# Patient Record
Sex: Female | Born: 1935 | Race: White | Hispanic: No | Smoking: Never smoker
Health system: Southern US, Community
[De-identification: ages and names within clinical notes are randomized; demographics above are authoritative.]

## PROBLEM LIST (undated history)

## (undated) DIAGNOSIS — R001 Bradycardia, unspecified: Secondary | ICD-10-CM

## (undated) DIAGNOSIS — M353 Polymyalgia rheumatica: Secondary | ICD-10-CM

## (undated) DIAGNOSIS — K449 Diaphragmatic hernia without obstruction or gangrene: Secondary | ICD-10-CM

## (undated) DIAGNOSIS — E039 Hypothyroidism, unspecified: Secondary | ICD-10-CM

## (undated) DIAGNOSIS — M1990 Unspecified osteoarthritis, unspecified site: Secondary | ICD-10-CM

## (undated) DIAGNOSIS — H269 Unspecified cataract: Secondary | ICD-10-CM

## (undated) DIAGNOSIS — K5730 Diverticulosis of large intestine without perforation or abscess without bleeding: Secondary | ICD-10-CM

## (undated) DIAGNOSIS — E785 Hyperlipidemia, unspecified: Secondary | ICD-10-CM

## (undated) DIAGNOSIS — M25562 Pain in left knee: Secondary | ICD-10-CM

## (undated) DIAGNOSIS — M1712 Unilateral primary osteoarthritis, left knee: Secondary | ICD-10-CM

## (undated) DIAGNOSIS — M4800 Spinal stenosis, site unspecified: Secondary | ICD-10-CM

## (undated) DIAGNOSIS — R1310 Dysphagia, unspecified: Secondary | ICD-10-CM

## (undated) DIAGNOSIS — K529 Noninfective gastroenteritis and colitis, unspecified: Secondary | ICD-10-CM

## (undated) DIAGNOSIS — F1920 Other psychoactive substance dependence, uncomplicated: Secondary | ICD-10-CM

## (undated) DIAGNOSIS — M5126 Other intervertebral disc displacement, lumbar region: Secondary | ICD-10-CM

## (undated) DIAGNOSIS — M4120 Other idiopathic scoliosis, site unspecified: Secondary | ICD-10-CM

## (undated) DIAGNOSIS — M25462 Effusion, left knee: Secondary | ICD-10-CM

## (undated) DIAGNOSIS — R2681 Unsteadiness on feet: Secondary | ICD-10-CM

## (undated) DIAGNOSIS — M48061 Spinal stenosis, lumbar region without neurogenic claudication: Secondary | ICD-10-CM

## (undated) DIAGNOSIS — N189 Chronic kidney disease, unspecified: Secondary | ICD-10-CM

## (undated) DIAGNOSIS — M5135 Other intervertebral disc degeneration, thoracolumbar region: Secondary | ICD-10-CM

## (undated) DIAGNOSIS — I350 Nonrheumatic aortic (valve) stenosis: Secondary | ICD-10-CM

## (undated) DIAGNOSIS — I517 Cardiomegaly: Secondary | ICD-10-CM

## (undated) DIAGNOSIS — R5381 Other malaise: Secondary | ICD-10-CM

## (undated) DIAGNOSIS — S81819A Laceration without foreign body, unspecified lower leg, initial encounter: Secondary | ICD-10-CM

## (undated) DIAGNOSIS — K52831 Collagenous colitis: Secondary | ICD-10-CM

## (undated) DIAGNOSIS — S22089A Unspecified fracture of T11-T12 vertebra, initial encounter for closed fracture: Secondary | ICD-10-CM

## (undated) DIAGNOSIS — R609 Edema, unspecified: Secondary | ICD-10-CM

## (undated) DIAGNOSIS — F419 Anxiety disorder, unspecified: Secondary | ICD-10-CM

## (undated) DIAGNOSIS — K589 Irritable bowel syndrome without diarrhea: Secondary | ICD-10-CM

## (undated) DIAGNOSIS — I10 Essential (primary) hypertension: Secondary | ICD-10-CM

## (undated) DIAGNOSIS — E876 Hypokalemia: Secondary | ICD-10-CM

## (undated) DIAGNOSIS — I491 Atrial premature depolarization: Secondary | ICD-10-CM

## (undated) DIAGNOSIS — M810 Age-related osteoporosis without current pathological fracture: Secondary | ICD-10-CM

## (undated) DIAGNOSIS — M25561 Pain in right knee: Secondary | ICD-10-CM

## (undated) DIAGNOSIS — K5792 Diverticulitis of intestine, part unspecified, without perforation or abscess without bleeding: Secondary | ICD-10-CM

## (undated) HISTORY — DX: Unspecified fracture of t11-T12 vertebra, initial encounter for closed fracture: S22.089A

## (undated) HISTORY — DX: Unspecified cataract: H26.9

## (undated) HISTORY — PX: OTHER SURGICAL HISTORY: SHX169

## (undated) HISTORY — DX: Diaphragmatic hernia without obstruction or gangrene: K44.9

## (undated) HISTORY — DX: Pain in right knee: M25.561

## (undated) HISTORY — DX: Collagenous colitis: K52.831

## (undated) HISTORY — DX: Other psychoactive substance dependence, uncomplicated: F19.20

## (undated) HISTORY — DX: Anxiety disorder, unspecified: F41.9

## (undated) HISTORY — DX: Chronic kidney disease, unspecified: N18.9

## (undated) HISTORY — DX: Age-related osteoporosis without current pathological fracture: M81.0

## (undated) HISTORY — DX: Edema, unspecified: R60.9

## (undated) HISTORY — DX: Irritable bowel syndrome without diarrhea: K58.9

## (undated) HISTORY — DX: Effusion, left knee: M25.462

## (undated) HISTORY — DX: Noninfective gastroenteritis and colitis, unspecified: K52.9

## (undated) HISTORY — DX: Other intervertebral disc degeneration, thoracolumbar region: M51.35

## (undated) HISTORY — PX: EYE SURGERY: SHX253

## (undated) HISTORY — DX: Diverticulosis of large intestine without perforation or abscess without bleeding: K57.30

## (undated) HISTORY — DX: Unsteadiness on feet: R26.81

## (undated) HISTORY — DX: Spinal stenosis, lumbar region without neurogenic claudication: M48.061

## (undated) HISTORY — DX: Other malaise: R53.81

## (undated) HISTORY — PX: BACK SURGERY: SHX140

## (undated) HISTORY — DX: Laceration without foreign body, unspecified lower leg, initial encounter: S81.819A

## (undated) HISTORY — DX: Cardiomegaly: I51.7

## (undated) HISTORY — DX: Dysphagia, unspecified: R13.10

## (undated) HISTORY — DX: Other intervertebral disc displacement, lumbar region: M51.26

## (undated) HISTORY — DX: Polymyalgia rheumatica: M35.3

## (undated) HISTORY — PX: APPENDECTOMY: SHX54

## (undated) HISTORY — DX: Unilateral primary osteoarthritis, left knee: M17.12

## (undated) HISTORY — DX: Pain in left knee: M25.562

## (undated) HISTORY — DX: Other idiopathic scoliosis, site unspecified: M41.20

## (undated) HISTORY — PX: CHOLECYSTECTOMY: SHX55

## (undated) HISTORY — DX: Hyperlipidemia, unspecified: E78.5

---

## 1964-01-15 HISTORY — PX: ABDOMINAL HYSTERECTOMY: SHX81

## 1997-12-22 LAB — HM COLONOSCOPY

## 1998-11-29 ENCOUNTER — Encounter: Payer: Self-pay | Admitting: Specialist

## 1998-11-29 ENCOUNTER — Ambulatory Visit (HOSPITAL_COMMUNITY): Admission: RE | Admit: 1998-11-29 | Discharge: 1998-11-29 | Payer: Self-pay | Admitting: Specialist

## 1999-10-25 ENCOUNTER — Ambulatory Visit (HOSPITAL_COMMUNITY): Admission: RE | Admit: 1999-10-25 | Discharge: 1999-10-25 | Payer: Self-pay | Admitting: Gastroenterology

## 1999-10-25 ENCOUNTER — Encounter (INDEPENDENT_AMBULATORY_CARE_PROVIDER_SITE_OTHER): Payer: Self-pay | Admitting: Specialist

## 2001-07-22 ENCOUNTER — Encounter: Payer: Self-pay | Admitting: Emergency Medicine

## 2001-07-22 ENCOUNTER — Emergency Department (HOSPITAL_COMMUNITY): Admission: EM | Admit: 2001-07-22 | Discharge: 2001-07-22 | Payer: Self-pay | Admitting: Emergency Medicine

## 2005-07-10 ENCOUNTER — Emergency Department (HOSPITAL_COMMUNITY): Admission: EM | Admit: 2005-07-10 | Discharge: 2005-07-10 | Payer: Self-pay | Admitting: Emergency Medicine

## 2005-12-03 ENCOUNTER — Other Ambulatory Visit: Admission: RE | Admit: 2005-12-03 | Discharge: 2005-12-03 | Payer: Self-pay | Admitting: Obstetrics and Gynecology

## 2006-02-19 ENCOUNTER — Encounter: Admission: RE | Admit: 2006-02-19 | Discharge: 2006-02-19 | Payer: Self-pay | Admitting: Cardiology

## 2006-03-30 ENCOUNTER — Emergency Department (HOSPITAL_COMMUNITY): Admission: EM | Admit: 2006-03-30 | Discharge: 2006-03-30 | Payer: Self-pay | Admitting: Family Medicine

## 2006-05-20 ENCOUNTER — Encounter: Admission: RE | Admit: 2006-05-20 | Discharge: 2006-05-20 | Payer: Self-pay | Admitting: Family Medicine

## 2008-07-23 ENCOUNTER — Encounter: Admission: RE | Admit: 2008-07-23 | Discharge: 2008-07-23 | Payer: Self-pay | Admitting: Family Medicine

## 2008-08-09 ENCOUNTER — Encounter: Admission: RE | Admit: 2008-08-09 | Discharge: 2008-08-09 | Payer: Self-pay | Admitting: Neurosurgery

## 2009-01-06 ENCOUNTER — Emergency Department (HOSPITAL_BASED_OUTPATIENT_CLINIC_OR_DEPARTMENT_OTHER): Admission: EM | Admit: 2009-01-06 | Discharge: 2009-01-06 | Payer: Self-pay | Admitting: Emergency Medicine

## 2010-02-04 ENCOUNTER — Encounter: Payer: Self-pay | Admitting: Cardiology

## 2010-06-01 NOTE — Procedures (Signed)
[facility]  Patient:    Christina Cabrera, Christina Cabrera                     MRN: [id number] Proc. Date: 10/25/99 Adm. Date:  [id number] Attending:  Orland Mustard CC:         Anna Genre. Little, M.D.   Procedure Report  PROCEDURE:  Colonoscopy and biopsy.  MEDICATIONS:  Fentanyl 75 mcg, Versed 8 mg IV.  INDICATION:  A woman with a history of collagenous colitis with continued diarrhea.  COLONOSCOPE:  Pediatric video colonoscope.  DESCRIPTION OF PROCEDURE:  The procedure had been explained to the patient and consent obtained.  With the patient in the left lateral decubitus position, the pediatric video colonoscope was inserted following digital rectal exam and advanced under direct visualization.  The prep was quite good.  We were able to advance to the cecum with abdominal pressure and the ileocecal valve and appendiceal orifice seen.  Scope withdrawn, random biopsies taken from cecum and transverse colon, jar #1; from the descending and sigmoid colon, jar #2. No significant diverticular disease.  The mucosa did appear somewhat "washed out," but no obvious ulcerations or edema.  The rectum was free of polyps.  No polyps or diverticular disease were seen throughout the colon.  ASSESSMENT:  Possible collagenous colitis.  Will check pathology, and will continue patient on current medicines for now.  See back in the office in four to six weeks. DD:  10/25/99 TD:  10/27/99 Job: [id number]

## 2010-09-10 ENCOUNTER — Other Ambulatory Visit: Payer: Self-pay | Admitting: Neurosurgery

## 2010-09-10 DIAGNOSIS — M545 Low back pain: Secondary | ICD-10-CM

## 2010-09-12 ENCOUNTER — Ambulatory Visit
Admission: RE | Admit: 2010-09-12 | Discharge: 2010-09-12 | Disposition: A | Discharge status: Home / Self Care | Payer: Medicare Other | Source: Ambulatory Visit | Attending: Neurosurgery | Admitting: Neurosurgery

## 2010-09-12 DIAGNOSIS — M545 Low back pain: Secondary | ICD-10-CM

## 2011-03-06 DIAGNOSIS — R1310 Dysphagia, unspecified: Secondary | ICD-10-CM | POA: Diagnosis not present

## 2011-03-06 DIAGNOSIS — K921 Melena: Secondary | ICD-10-CM | POA: Diagnosis not present

## 2011-03-06 DIAGNOSIS — R300 Dysuria: Secondary | ICD-10-CM | POA: Diagnosis not present

## 2011-03-06 DIAGNOSIS — R799 Abnormal finding of blood chemistry, unspecified: Secondary | ICD-10-CM | POA: Diagnosis not present

## 2011-03-06 DIAGNOSIS — Z79899 Other long term (current) drug therapy: Secondary | ICD-10-CM | POA: Diagnosis not present

## 2011-03-06 DIAGNOSIS — E782 Mixed hyperlipidemia: Secondary | ICD-10-CM | POA: Diagnosis not present

## 2011-03-28 ENCOUNTER — Ambulatory Visit
Admission: RE | Admit: 2011-03-28 | Discharge: 2011-03-28 | Disposition: A | Discharge status: Home / Self Care | Payer: Medicare Other | Source: Ambulatory Visit | Attending: Nephrology | Admitting: Nephrology

## 2011-03-28 ENCOUNTER — Other Ambulatory Visit: Payer: Self-pay | Admitting: Nephrology

## 2011-03-28 DIAGNOSIS — N179 Acute kidney failure, unspecified: Secondary | ICD-10-CM

## 2011-03-28 DIAGNOSIS — N184 Chronic kidney disease, stage 4 (severe): Secondary | ICD-10-CM

## 2011-03-28 DIAGNOSIS — I129 Hypertensive chronic kidney disease with stage 1 through stage 4 chronic kidney disease, or unspecified chronic kidney disease: Secondary | ICD-10-CM | POA: Diagnosis not present

## 2011-03-28 DIAGNOSIS — R358 Other polyuria: Secondary | ICD-10-CM | POA: Diagnosis not present

## 2011-03-28 DIAGNOSIS — N189 Chronic kidney disease, unspecified: Secondary | ICD-10-CM | POA: Diagnosis not present

## 2011-04-29 DIAGNOSIS — R002 Palpitations: Secondary | ICD-10-CM | POA: Diagnosis not present

## 2011-04-29 DIAGNOSIS — R011 Cardiac murmur, unspecified: Secondary | ICD-10-CM | POA: Diagnosis not present

## 2011-04-29 DIAGNOSIS — E039 Hypothyroidism, unspecified: Secondary | ICD-10-CM | POA: Diagnosis not present

## 2011-05-03 DIAGNOSIS — R011 Cardiac murmur, unspecified: Secondary | ICD-10-CM | POA: Diagnosis not present

## 2011-05-03 DIAGNOSIS — R002 Palpitations: Secondary | ICD-10-CM | POA: Diagnosis not present

## 2011-05-13 DIAGNOSIS — R011 Cardiac murmur, unspecified: Secondary | ICD-10-CM | POA: Diagnosis not present

## 2011-05-13 DIAGNOSIS — R002 Palpitations: Secondary | ICD-10-CM | POA: Diagnosis not present

## 2011-06-03 DIAGNOSIS — R609 Edema, unspecified: Secondary | ICD-10-CM | POA: Diagnosis not present

## 2011-06-03 DIAGNOSIS — I129 Hypertensive chronic kidney disease with stage 1 through stage 4 chronic kidney disease, or unspecified chronic kidney disease: Secondary | ICD-10-CM | POA: Diagnosis not present

## 2011-06-03 DIAGNOSIS — N184 Chronic kidney disease, stage 4 (severe): Secondary | ICD-10-CM | POA: Diagnosis not present

## 2011-06-03 DIAGNOSIS — E039 Hypothyroidism, unspecified: Secondary | ICD-10-CM | POA: Diagnosis not present

## 2011-06-03 DIAGNOSIS — I10 Essential (primary) hypertension: Secondary | ICD-10-CM | POA: Diagnosis not present

## 2011-06-27 DIAGNOSIS — H04129 Dry eye syndrome of unspecified lacrimal gland: Secondary | ICD-10-CM | POA: Diagnosis not present

## 2011-06-27 DIAGNOSIS — H3530 Unspecified macular degeneration: Secondary | ICD-10-CM | POA: Diagnosis not present

## 2011-06-27 DIAGNOSIS — H35369 Drusen (degenerative) of macula, unspecified eye: Secondary | ICD-10-CM | POA: Diagnosis not present

## 2011-06-27 DIAGNOSIS — Z961 Presence of intraocular lens: Secondary | ICD-10-CM | POA: Diagnosis not present

## 2011-07-11 ENCOUNTER — Emergency Department (HOSPITAL_COMMUNITY): Payer: Medicare Other

## 2011-07-11 ENCOUNTER — Encounter (HOSPITAL_COMMUNITY): Payer: Self-pay | Admitting: Emergency Medicine

## 2011-07-11 ENCOUNTER — Inpatient Hospital Stay (HOSPITAL_COMMUNITY)
Admission: EM | Admit: 2011-07-11 | Discharge: 2011-07-13 | DRG: 394 | Disposition: A | Discharge status: Home / Self Care | Payer: Medicare Other | Attending: Internal Medicine | Admitting: Internal Medicine

## 2011-07-11 DIAGNOSIS — M353 Polymyalgia rheumatica: Secondary | ICD-10-CM

## 2011-07-11 DIAGNOSIS — R1084 Generalized abdominal pain: Secondary | ICD-10-CM | POA: Diagnosis present

## 2011-07-11 DIAGNOSIS — K559 Vascular disorder of intestine, unspecified: Secondary | ICD-10-CM | POA: Diagnosis not present

## 2011-07-11 DIAGNOSIS — K52831 Collagenous colitis: Secondary | ICD-10-CM

## 2011-07-11 DIAGNOSIS — G8929 Other chronic pain: Secondary | ICD-10-CM

## 2011-07-11 DIAGNOSIS — I120 Hypertensive chronic kidney disease with stage 5 chronic kidney disease or end stage renal disease: Secondary | ICD-10-CM | POA: Diagnosis not present

## 2011-07-11 DIAGNOSIS — E872 Acidosis, unspecified: Secondary | ICD-10-CM | POA: Diagnosis present

## 2011-07-11 DIAGNOSIS — R109 Unspecified abdominal pain: Secondary | ICD-10-CM | POA: Diagnosis present

## 2011-07-11 DIAGNOSIS — E039 Hypothyroidism, unspecified: Secondary | ICD-10-CM | POA: Diagnosis present

## 2011-07-11 DIAGNOSIS — K551 Chronic vascular disorders of intestine: Principal | ICD-10-CM | POA: Diagnosis present

## 2011-07-11 DIAGNOSIS — K449 Diaphragmatic hernia without obstruction or gangrene: Secondary | ICD-10-CM | POA: Diagnosis not present

## 2011-07-11 DIAGNOSIS — K5289 Other specified noninfective gastroenteritis and colitis: Secondary | ICD-10-CM | POA: Diagnosis present

## 2011-07-11 DIAGNOSIS — N184 Chronic kidney disease, stage 4 (severe): Secondary | ICD-10-CM | POA: Diagnosis present

## 2011-07-11 DIAGNOSIS — I129 Hypertensive chronic kidney disease with stage 1 through stage 4 chronic kidney disease, or unspecified chronic kidney disease: Secondary | ICD-10-CM | POA: Diagnosis present

## 2011-07-11 DIAGNOSIS — K5900 Constipation, unspecified: Secondary | ICD-10-CM | POA: Diagnosis not present

## 2011-07-11 DIAGNOSIS — E876 Hypokalemia: Secondary | ICD-10-CM | POA: Diagnosis not present

## 2011-07-11 DIAGNOSIS — R52 Pain, unspecified: Secondary | ICD-10-CM | POA: Diagnosis not present

## 2011-07-11 DIAGNOSIS — I10 Essential (primary) hypertension: Secondary | ICD-10-CM | POA: Diagnosis present

## 2011-07-11 HISTORY — DX: Irritable bowel syndrome, unspecified: K58.9

## 2011-07-11 HISTORY — DX: Essential (primary) hypertension: I10

## 2011-07-11 HISTORY — DX: Polymyalgia rheumatica: M35.3

## 2011-07-11 HISTORY — DX: Spinal stenosis, site unspecified: M48.00

## 2011-07-11 HISTORY — DX: Unspecified osteoarthritis, unspecified site: M19.90

## 2011-07-11 HISTORY — DX: Collagenous colitis: K52.831

## 2011-07-11 HISTORY — DX: Diverticulitis of intestine, part unspecified, without perforation or abscess without bleeding: K57.92

## 2011-07-11 LAB — CBC
HCT: 36.3 % (ref 36.0–46.0)
MCH: 28.6 pg (ref 26.0–34.0)
MCHC: 33.3 g/dL (ref 30.0–36.0)
MCV: 85.8 fL (ref 78.0–100.0)
RDW: 13.7 % (ref 11.5–15.5)

## 2011-07-11 LAB — URINALYSIS, ROUTINE W REFLEX MICROSCOPIC
Bilirubin Urine: NEGATIVE
Nitrite: NEGATIVE
Protein, ur: NEGATIVE mg/dL
Specific Gravity, Urine: 1.011 (ref 1.005–1.030)
Urobilinogen, UA: 0.2 mg/dL (ref 0.0–1.0)

## 2011-07-11 LAB — LIPASE, BLOOD: Lipase: 41 U/L (ref 11–59)

## 2011-07-11 LAB — LACTIC ACID, PLASMA: Lactic Acid, Venous: 3.4 mmol/L — ABNORMAL HIGH (ref 0.5–2.2)

## 2011-07-11 LAB — OCCULT BLOOD, POC DEVICE: Fecal Occult Bld: NEGATIVE

## 2011-07-11 LAB — COMPREHENSIVE METABOLIC PANEL
Albumin: 3.7 g/dL (ref 3.5–5.2)
BUN: 41 mg/dL — ABNORMAL HIGH (ref 6–23)
Creatinine, Ser: 1.84 mg/dL — ABNORMAL HIGH (ref 0.50–1.10)
Total Protein: 6.8 g/dL (ref 6.0–8.3)

## 2011-07-11 LAB — MAGNESIUM: Magnesium: 1.9 mg/dL (ref 1.5–2.5)

## 2011-07-11 MED ORDER — POTASSIUM CHLORIDE 10 MEQ/100ML IV SOLN
10.0000 meq | Freq: Once | INTRAVENOUS | Status: AC
  Administered 2011-07-11: 10 meq via INTRAVENOUS
  Filled 2011-07-11: qty 100

## 2011-07-11 MED ORDER — MORPHINE SULFATE 2 MG/ML IJ SOLN
1.0000 mg | Freq: Once | INTRAMUSCULAR | Status: DC
  Filled 2011-07-11: qty 1

## 2011-07-11 MED ORDER — POTASSIUM CHLORIDE 20 MEQ/15ML (10%) PO LIQD
40.0000 meq | Freq: Once | ORAL | Status: AC
  Administered 2011-07-11: 40 meq via ORAL
  Filled 2011-07-11: qty 15

## 2011-07-11 MED ORDER — POTASSIUM CHLORIDE 10 MEQ/100ML IV SOLN
10.0000 meq | INTRAVENOUS | Status: AC
  Administered 2011-07-11 – 2011-07-12 (×2): 10 meq via INTRAVENOUS
  Filled 2011-07-11 (×2): qty 100

## 2011-07-11 MED ORDER — ALPRAZOLAM 0.25 MG PO TABS
0.2500 mg | ORAL_TABLET | Freq: Once | ORAL | Status: AC
  Administered 2011-07-11: 0.25 mg via ORAL
  Filled 2011-07-11: qty 1

## 2011-07-11 MED ORDER — SODIUM CHLORIDE 0.9 % IV SOLN
Freq: Once | INTRAVENOUS | Status: AC
  Administered 2011-07-11: 75 mL/h via INTRAVENOUS

## 2011-07-11 MED ORDER — HYDROCODONE-ACETAMINOPHEN 5-325 MG PO TABS
1.0000 | ORAL_TABLET | Freq: Once | ORAL | Status: AC | PRN
  Administered 2011-07-11: 1 via ORAL
  Filled 2011-07-11: qty 1

## 2011-07-11 NOTE — ED Notes (Signed)
Pt bought in by [facility] @ [place] for c/o abd pain after lunch today hx of Colitis

## 2011-07-11 NOTE — ED Provider Notes (Signed)
History     CSN: [id number]  Arrival date & time 07/11/11  1423   First MD Initiated Contact with Patient 07/11/11 1522      Chief Complaint  Patient presents with  . Abdominal Pain    (Consider location/radiation/quality/duration/timing/severity/associated sxs/prior treatment) HPI Comments: 78 yo female with history of collagenous colitis and chronic constipation secondary to chronic back pain and vicodin, presents from ALF after an onset of acute abdominal pain while eating lunch. She states she normally has generalized abdominal soreness and early satiety related to her constipation, has to use a bulb syringe for manual disimpaction at times. This pain is improved now, lasted 2 hours, radiating across upper abdomen. Last BM was yesterday, small amount. Takes daily citrucel for constipation.   Denies any fevers, chills, dysuria, nausea, emesis, diarrhea, blood in stool.   Patient is a 78 y.o. female presenting with abdominal pain.  Abdominal Pain The primary symptoms of the illness include abdominal pain. The primary symptoms of the illness do not include fever, shortness of breath, nausea, vomiting, diarrhea or dysuria.  Additional symptoms associated with the illness include constipation and back pain. Symptoms associated with the illness do not include chills or hematuria.    Past Medical History  Diagnosis Date  . Hypertension     No past surgical history on file.  No family history on file.  History  Substance Use Topics  . Smoking status: Not on file  . Smokeless tobacco: Not on file  . Alcohol Use:     OB History    Grav Para Term Preterm Abortions TAB SAB Ect Mult Living                  Review of Systems  Constitutional: Negative for fever and chills.  Respiratory: Negative for cough, shortness of breath and wheezing.   Cardiovascular: Negative for chest pain.  Gastrointestinal: Positive for abdominal pain, constipation and abdominal distention. Negative  for nausea, vomiting, diarrhea, blood in stool, anal bleeding and rectal pain.  Genitourinary: Negative for dysuria, hematuria, difficulty urinating and pelvic pain.  Musculoskeletal: Positive for back pain.  Neurological: Negative for dizziness and syncope.  All other systems reviewed and are negative.    Allergies  Quinolones  Home Medications   Current Outpatient Rx  Name Route Sig Dispense Refill  . ALPRAZOLAM 0.5 MG PO TABS Oral Take 0.25 mg by mouth 2 (two) times daily.    Marland Kitchen AMLODIPINE BESYLATE 2.5 MG PO TABS Oral Take 2.5 mg by mouth daily.    . ATORVASTATIN CALCIUM 10 MG PO TABS Oral Take 10 mg by mouth daily.    Marland Kitchen CLONIDINE HCL 0.1 MG PO TABS Oral Take 0.05 mg by mouth at bedtime. Pt takes 1/2 tab for 0.05mg  dose    . FUROSEMIDE 40 MG PO TABS Oral Take 40 mg by mouth 2 (two) times daily.    Marland Kitchen HYDRALAZINE HCL 50 MG PO TABS Oral Take 50 mg by mouth 2 (two) times daily.    Marland Kitchen HYDROCODONE-ACETAMINOPHEN 10-325 MG PO TABS Oral Take 1 tablet by mouth every 8 (eight) hours as needed. pain    . ALLERGY EYE DROPS OP Ophthalmic Apply 1 drop to eye 2 (two) times daily.    Marland Kitchen LEVOTHYROXINE SODIUM 50 MCG PO TABS Oral Take 25 mcg by mouth daily. Pt takes 1/2 tab for dose    . METHYLCELLULOSE (LAXATIVE) PO POWD Oral Take 1 packet by mouth daily.    . NEBIVOLOL HCL 10 MG  PO TABS Oral Take 10 mg by mouth every morning.    Marland Kitchen OMEPRAZOLE 20 MG PO CPDR Oral Take 20 mg by mouth daily.    Marland Kitchen PREDNISONE 5 MG PO TABS Oral Take 5 mg by mouth every morning.    Marland Kitchen SERTRALINE HCL 25 MG PO TABS Oral Take 25 mg by mouth daily.      BP 154/62  Pulse 53  Temp 97.5 F (36.4 C) (Oral)  Resp 18  SpO2 98%  Physical Exam  Vitals reviewed. Constitutional: She is oriented to person, place, and time. She appears well-developed and well-nourished. No distress.  HENT:  Head: Normocephalic and atraumatic.  Eyes: EOM are normal. Pupils are equal, round, and reactive to light.  Cardiovascular: Normal rate,  regular rhythm and normal heart sounds.   No murmur heard. Pulmonary/Chest: Effort normal and breath sounds normal.  Abdominal: Soft. She exhibits no mass. There is tenderness. There is no rebound and no guarding.       Hyperactive BS. Mild distention. General TTP with deep palpation.  Musculoskeletal: She exhibits no edema and no tenderness.  Neurological: She is alert and oriented to person, place, and time. No cranial nerve deficit.  Skin: No rash noted. She is not diaphoretic.  Psychiatric: She has a normal mood and affect.    ED Course  Procedures (including critical care time)   Labs Reviewed  CBC  COMPREHENSIVE METABOLIC PANEL  LIPASE, BLOOD  LACTIC ACID, PLASMA  URINALYSIS, ROUTINE W REFLEX MICROSCOPIC   Results for orders placed during the hospital encounter of 07/11/11 (from the past 24 hour(s))  URINALYSIS, ROUTINE W REFLEX MICROSCOPIC     Status: Normal   Collection Time   07/11/11  4:09 PM      Component Value Range   Color, Urine YELLOW  YELLOW   APPearance CLEAR  CLEAR   Specific Gravity, Urine 1.011  1.005 - 1.030   pH 7.5  5.0 - 8.0   Glucose, UA NEGATIVE  NEGATIVE mg/dL   Hgb urine dipstick NEGATIVE  NEGATIVE   Bilirubin Urine NEGATIVE  NEGATIVE   Ketones, ur NEGATIVE  NEGATIVE mg/dL   Protein, ur NEGATIVE  NEGATIVE mg/dL   Urobilinogen, UA 0.2  0.0 - 1.0 mg/dL   Nitrite NEGATIVE  NEGATIVE   Leukocytes, UA NEGATIVE  NEGATIVE  CBC     Status: Normal   Collection Time   07/11/11  4:13 PM      Component Value Range   WBC 5.8  4.0 - 10.5 K/uL   RBC 4.23  3.87 - 5.11 MIL/uL   Hemoglobin 12.1  12.0 - 15.0 g/dL   HCT 16.1  09.6 - 04.5 %   MCV 85.8  78.0 - 100.0 fL   MCH 28.6  26.0 - 34.0 pg   MCHC 33.3  30.0 - 36.0 g/dL   RDW 40.9  81.1 - 91.4 %   Platelets 195  150 - 400 K/uL  COMPREHENSIVE METABOLIC PANEL     Status: Abnormal   Collection Time   07/11/11  4:13 PM      Component Value Range   Sodium 140  135 - 145 mEq/L   Potassium 2.6 (*) 3.5 - 5.1  mEq/L   Chloride 99  96 - 112 mEq/L   CO2 25  19 - 32 mEq/L   Glucose, Bld 157 (*) 70 - 99 mg/dL   BUN 41 (*) 6 - 23 mg/dL   Creatinine, Ser 7.82 (*) 0.50 - 1.10 mg/dL  Calcium 9.4  8.4 - 10.5 mg/dL   Total Protein 6.8  6.0 - 8.3 g/dL   Albumin 3.7  3.5 - 5.2 g/dL   AST 29  0 - 37 U/L   ALT 15  0 - 35 U/L   Alkaline Phosphatase 54  39 - 117 U/L   Total Bilirubin 0.4  0.3 - 1.2 mg/dL   GFR calc non Af Amer 23 (*) >90 mL/min   GFR calc Af Amer 26 (*) >90 mL/min  LIPASE, BLOOD     Status: Normal   Collection Time   07/11/11  4:13 PM      Component Value Range   Lipase 41  11 - 59 U/L  LACTIC ACID, PLASMA     Status: Abnormal   Collection Time   07/11/11  4:13 PM      Component Value Range   Lactic Acid, Venous 3.4 (*) 0.5 - 2.2 mmol/L    Ct Abdomen Pelvis Wo Contrast  07/11/2011  *RADIOLOGY REPORT*  Clinical Data: Abdominal pain. Elevated lactate  CT ABDOMEN AND PELVIS WITHOUT CONTRAST  Technique:  Multidetector CT imaging of the abdomen and pelvis was performed following the standard protocol without intravenous contrast.  Comparison: None.  Findings: There is mild pleural fluid at the left lung base.  No pericardial fluid.  There is a large hiatal hernia.  Small bowel is normal.  Colon and rectosigmoid colon are normal.  Non-IV contrast images demonstrate no focal hepatic lesion.  Prior cholecystectomy.  The pancreas, spleen, and adrenal glands, and kidneys are normal.  Abdominal aorta normal caliber.  No retroperitoneal periportal lymphadenopathy.  No free fluid the pelvis.  Prior hysterectomy.  No pelvic lymphadenopathy. Review of  bone windows demonstrates no aggressive osseous lesions. There is severe disc osteophytic disease of the spine.  IMPRESSION:  1.  Large hiatal hernia.  2.  Small left effusion. 3.  No evidence of bowel obstruction or free air. 4. Severe degenerative changes of the spine.  Original Report Authenticated By: Genevive Bi, M.D.   Dg Abd Acute  W/chest  07/11/2011  *RADIOLOGY REPORT*  Clinical Data: Constipation, abdominal  ACUTE ABDOMEN SERIES (ABDOMEN 2 VIEW & CHEST 1 VIEW)  Comparison: Pain  Findings: Normal cardiac silhouette.  The aorta is ectatic. Scoliosis noted.  Lungs hyperinflated.  No free air beneath hemidiaphragms.  No dilated loops of large or small bowel.  No pathologic calcifications.  Surgical clips the right upper quadrant.  IMPRESSION:  1.  No acute cardiopulmonary findings. 2.  No evidence of bowel obstruction or free air. 3.  No evidence of constipation.  Original Report Authenticated By: Genevive Bi, M.D.     1. Chronic generalized abdominal pain   2. Constipation   3. Lactic acidosis     MDM  78 yo female with chronic constipation and collagenous colitis presents with an episode of acute on chronic abdominal pain today, now with generalized soreness and elevated lactate. Will check CT abdomen to rule out ischemia.   8:23 PM Patient's pain is improved, she states she is having diffuse soreness. Tolerating orals. Low potassium likely secondary to lasix, treated with IV run and oral dose 40 mEq. CT results are negative for intra-abdominal pathology, but with elevated lactate, mild acidosis and pain onset with eating, still concern for gut ischemia. Plan to admit for hydration and observation. Case d/w [facility] physician on call.       Durwin Reges, MD 07/11/11 2159

## 2011-07-11 NOTE — ED Notes (Signed)
ZOX:[id number]<AV> Expected date:07/11/11<BR> Expected time:<BR> Means of arrival:<BR> Comments:<BR> M10- 89yof -  abd pain(ongoing)

## 2011-07-11 NOTE — H&P (Signed)
Hospital Admission Note Date: 07/11/2011  Patient name: Christina Cabrera Medical record number: [id number] Date of birth: 03/10/1917 Age: 78 y.o. Gender: female PCP: No primary provider on file.  Attending physician: Altha Harm, MD  Chief Complaint: Severe postprandial abdominal pain  History of Present Illness: This is a very pleasant 78 year old female with a history of collagenous colitis who has chronic abdominal pain and back pain. The patient states that after eating her meal today she had an acute onset of acute sharp abdominal pain extending from the left upper quadrant of the right upper quadrant. She states that the pain was at least 10/10 at the time of its occurrence and continued until she came to the emergency room. Several hours after arriving to the emergency room that  acute pain has resolved and she now is back to her chronic pain which she states is a chronic aching in that as a 3/10. The patient also states that she has chronic low back pain which she manages with pain medications.   The patient states that she has been having some postprandial pain for some time however it is not as intense as it was today. She states that the pain occupied her consciousness at the time to the extent that she's unable to identify any associated symptoms. She denies any specific palliative or provocative features. She denies any chest pain, dizziness, near syncope, loss of consciousness, palpitations, dysuria, frequency. She states that she suffers from constipation and has to use a bulb syringe to assist evacuation of her stool or regular basis.  In the emergency room the patient was noted to have an elevated lactic acid and to decrease potassium. We are asked to see the patient for further evaluation and management.  Please also note that I discussed with the patient in the presence of her daughter her advanced directive and the patient states that she does want to be a no CODE BLUE. The  patient also states that she does not want to undergo any invasive testing or therapy for her condition.  Scheduled Meds:   . sodium chloride   Intravenous Once  . ALPRAZolam  0.25 mg Oral Once  . potassium chloride  10 mEq Intravenous Once  . potassium chloride  40 mEq Oral Once  . DISCONTD:  morphine injection  1 mg Intravenous Once   Continuous Infusions:  PRN Meds:.HYDROcodone-acetaminophen Allergies: Quinolones Past Medical History  Diagnosis Date  . Hypertension   . Arthritis   . Diverticulitis   . Irritable bowel   . PMR (polymyalgia rheumatica)   . Spinal stenosis   . Thyroid disease    Past Surgical History  Procedure Date  . Back surgery    No family history on file. History   Social History  . Marital Status: Widowed    Spouse Name: N/A    Number of Children: N/A  . Years of Education: N/A   Occupational History  . Not on file.   Social History Main Topics  . Smoking status: Not on file  . Smokeless tobacco: Not on file  . Alcohol Use: No  . Drug Use: No  . Sexually Active: No   Other Topics Concern  . Not on file   Social History Narrative  . No narrative on file   Review of Systems: Review of systems are negative except as noted in the history of present illness Physical Exam: No intake or output data in the 24 hours ending 07/11/11 2321 General: Alert, awake,  oriented x3, in no acute distress. She appears younger than her stated age.  HEENT: Burns/AT PEERL, EOMI Neck: Trachea midline,  no masses, no thyromegal,y no JVD, no carotid bruit OROPHARYNX:  Moist, No exudate/ erythema/lesions.  Heart: Regular rate and rhythm, without murmurs, rubs, gallops.  Lungs: Clear to auscultation, no wheezing or rhonchi noted. No increased vocal fremitus resonant to percussion  Abdomen: Soft and with diffuse tenderness. It is nondistended and with positive bowel sounds, no masses no hepatosplenomegaly noted..  Neuro: No focal neurological deficits noted  cranial nerves II through XII grossly intact.  Musculoskeletal: No warm swelling or erythema around joints, no spinal tenderness noted. Psychiatric: Patient alert and oriented x3, good insight and cognition, good recent to remote recall.   Lab results:  [facility] 07/11/11 1613  NA 140  K 2.6*  CL 99  CO2 25  GLUCOSE 157*  BUN 41*  CREATININE 1.84*  CALCIUM 9.4  MG --  PHOS --    Basename 07/11/11 1613  AST 29  ALT 15  ALKPHOS 54  BILITOT 0.4  PROT 6.8  ALBUMIN 3.7    Basename 07/11/11 1613  LIPASE 41  AMYLASE --    Basename 07/11/11 1613  WBC 5.8  NEUTROABS --  HGB 12.1  HCT 36.3  MCV 85.8  PLT 195   No results found for this basename: CKTOTAL:3,CKMB:3,CKMBINDEX:3,TROPONINI:3 in the last 72 hours No components found with this basename: POCBNP:3 No results found for this basename: DDIMER:2 in the last 72 hours No results found for this basename: HGBA1C:2 in the last 72 hours No results found for this basename: CHOL:2,HDL:2,LDLCALC:2,TRIG:2,CHOLHDL:2,LDLDIRECT:2 in the last 72 hours No results found for this basename: TSH,T4TOTAL,FREET3,T3FREE,THYROIDAB in the last 72 hours No results found for this basename: VITAMINB12:2,FOLATE:2,FERRITIN:2,TIBC:2,IRON:2,RETICCTPCT:2 in the last 72 hours Imaging results:  Ct Abdomen Pelvis Wo Contrast  07/11/2011  *RADIOLOGY REPORT*  Clinical Data: Abdominal pain. Elevated lactate  CT ABDOMEN AND PELVIS WITHOUT CONTRAST  Technique:  Multidetector CT imaging of the abdomen and pelvis was performed following the standard protocol without intravenous contrast.  Comparison: None.  Findings: There is mild pleural fluid at the left lung base.  No pericardial fluid.  There is a large hiatal hernia.  Small bowel is normal.  Colon and rectosigmoid colon are normal.  Non-IV contrast images demonstrate no focal hepatic lesion.  Prior cholecystectomy.  The pancreas, spleen, and adrenal glands, and kidneys are normal.  Abdominal aorta normal  caliber.  No retroperitoneal periportal lymphadenopathy.  No free fluid the pelvis.  Prior hysterectomy.  No pelvic lymphadenopathy. Review of  bone windows demonstrates no aggressive osseous lesions. There is severe disc osteophytic disease of the spine.  IMPRESSION:  1.  Large hiatal hernia.  2.  Small left effusion. 3.  No evidence of bowel obstruction or free air. 4. Severe degenerative changes of the spine.  Original Report Authenticated By: Genevive Bi, M.D.   Dg Abd Acute W/chest  07/11/2011  *RADIOLOGY REPORT*  Clinical Data: Constipation, abdominal  ACUTE ABDOMEN SERIES (ABDOMEN 2 VIEW & CHEST 1 VIEW)  Comparison: Pain  Findings: Normal cardiac silhouette.  The aorta is ectatic. Scoliosis noted.  Lungs hyperinflated.  No free air beneath hemidiaphragms.  No dilated loops of large or small bowel.  No pathologic calcifications.  Surgical clips the right upper quadrant.  IMPRESSION:  1.  No acute cardiopulmonary findings. 2.  No evidence of bowel obstruction or free air. 3.  No evidence of constipation.  Original Report Authenticated By: Genevive Bi, M.D.  Other results:    Patient Active Hospital Problem List: Abdominal pain, acute (07/11/2011)   Assessment: This is a patient who presents with what appears to be a chronic history of some postprandial pain which is now intensified in severity. This history and symptomology coupled with the findings of lactic acidosis raises the question of mesenteric ischemia. The patient however is very clear therapy she does not want invasive testing or therapy performed. This eliminates the possibility of stenting in this patient if there is an area to be stented. I think at this point I will treat the patient conservatively was appears out of observation with the focus being on control of her pain. She is very clear that the most important thing for her is to control her pain. I've explained to her that if indeed this is mesenteric ischemia it may  require therapy other than pain medication to achieve this goal. The patient also gives a history of constipation and this is likely compounded by the fact that she is taking pain medications her current Lasix. I will put the patient on a bowel regimen to help with this.     Lactic acidosis (07/11/2011)   Assessment: The patient has no signs of infection to account for her lactic acidosis outside of possible mesenteric ischemia and may be some dehydration. We will recheck her lactic acidosis in the morning and adjust therapy according to the findings.    Hypokalemia (07/11/2011)   Assessment: I will repeat by IV and orally. Evaluate potassium levels in the morning and also check a magnesium to ensure the patient is not hypomagnesemic.    Colitis, collagenous (07/11/2011)   Assessment: Patient has a history of colitis and is under the care of Dr. Randa Evens gastroenterology. As he is yet to see the patient in consultation.    CKD (chronic kidney disease), stage IV (07/11/2011)   Assessment: The patient is on the care of Dr. goals were for chronic kidney disease stage IV. She states that her usual creatinine is approximately 1.8 1.9. This indicates that her creatinine is currently at its baseline.    Hypothyroidism (07/11/2011)   Assessment: I will check a TSH on this patient as a dysthyroid stage could certainly contribute to some of the symptoms that she's having. Meanwhile I will continue her current dose of Synthroid.    HTN (hypertension) (07/11/2011)   Assessment: We'll continue the patient's current medications and monitor her blood pressure.    DVT prophylaxis with Lovenox.  Total time spent in intervene in evaluating the patient approximately 54 minutes  Alizzon Dioguardi A. 07/11/2011, 11:21 PM

## 2011-07-12 ENCOUNTER — Encounter (HOSPITAL_COMMUNITY): Payer: Self-pay | Admitting: *Deleted

## 2011-07-12 DIAGNOSIS — N184 Chronic kidney disease, stage 4 (severe): Secondary | ICD-10-CM

## 2011-07-12 DIAGNOSIS — R1084 Generalized abdominal pain: Secondary | ICD-10-CM

## 2011-07-12 DIAGNOSIS — E872 Acidosis: Secondary | ICD-10-CM

## 2011-07-12 DIAGNOSIS — E876 Hypokalemia: Secondary | ICD-10-CM

## 2011-07-12 LAB — BASIC METABOLIC PANEL
CO2: 27 mEq/L (ref 19–32)
Calcium: 9 mg/dL (ref 8.4–10.5)
Creatinine, Ser: 1.5 mg/dL — ABNORMAL HIGH (ref 0.50–1.10)
GFR calc Af Amer: 34 mL/min — ABNORMAL LOW (ref >90)
GFR calc non Af Amer: 29 mL/min — ABNORMAL LOW (ref >90)
Sodium: 141 mEq/L (ref 135–145)

## 2011-07-12 LAB — MRSA PCR SCREENING: MRSA by PCR: NEGATIVE

## 2011-07-12 LAB — LACTIC ACID, PLASMA: Lactic Acid, Venous: 3.8 mmol/L — ABNORMAL HIGH (ref 0.5–2.2)

## 2011-07-12 LAB — TSH: TSH: 2.511 u[IU]/mL (ref 0.350–4.500)

## 2011-07-12 MED ORDER — FUROSEMIDE 40 MG PO TABS
40.0000 mg | ORAL_TABLET | Freq: Two times a day (BID) | ORAL | Status: DC
  Administered 2011-07-12 – 2011-07-13 (×3): 40 mg via ORAL
  Filled 2011-07-12 (×5): qty 1

## 2011-07-12 MED ORDER — PREDNISONE 5 MG PO TABS
5.0000 mg | ORAL_TABLET | Freq: Every morning | ORAL | Status: DC
  Administered 2011-07-12 – 2011-07-13 (×2): 5 mg via ORAL
  Filled 2011-07-12 (×2): qty 1

## 2011-07-12 MED ORDER — BISACODYL 10 MG RE SUPP
10.0000 mg | Freq: Every day | RECTAL | Status: DC | PRN
  Filled 2011-07-12: qty 1

## 2011-07-12 MED ORDER — POLYETHYLENE GLYCOL 3350 17 G PO PACK
17.0000 g | PACK | Freq: Every day | ORAL | Status: DC | PRN
  Administered 2011-07-13: 17 g via ORAL
  Filled 2011-07-12 (×2): qty 1

## 2011-07-12 MED ORDER — SERTRALINE HCL 25 MG PO TABS
25.0000 mg | ORAL_TABLET | Freq: Every day | ORAL | Status: DC
  Administered 2011-07-12 – 2011-07-13 (×2): 25 mg via ORAL
  Filled 2011-07-12 (×2): qty 1

## 2011-07-12 MED ORDER — POTASSIUM CHLORIDE CRYS ER 20 MEQ PO TBCR
40.0000 meq | EXTENDED_RELEASE_TABLET | Freq: Once | ORAL | Status: AC
  Administered 2011-07-12: 40 meq via ORAL
  Filled 2011-07-12: qty 2

## 2011-07-12 MED ORDER — NEBIVOLOL HCL 10 MG PO TABS
10.0000 mg | ORAL_TABLET | Freq: Every morning | ORAL | Status: DC
  Administered 2011-07-12 – 2011-07-13 (×2): 10 mg via ORAL
  Filled 2011-07-12 (×2): qty 1

## 2011-07-12 MED ORDER — HYDRALAZINE HCL 50 MG PO TABS
50.0000 mg | ORAL_TABLET | Freq: Two times a day (BID) | ORAL | Status: DC
  Administered 2011-07-12 – 2011-07-13 (×4): 50 mg via ORAL
  Filled 2011-07-12 (×5): qty 1

## 2011-07-12 MED ORDER — ACETAMINOPHEN 650 MG RE SUPP: 650.0000 mg | Freq: Four times a day (QID) | RECTAL | Status: DC | PRN

## 2011-07-12 MED ORDER — ATORVASTATIN CALCIUM 10 MG PO TABS
10.0000 mg | ORAL_TABLET | Freq: Every day | ORAL | Status: DC
  Administered 2011-07-12 – 2011-07-13 (×2): 10 mg via ORAL
  Filled 2011-07-12 (×2): qty 1

## 2011-07-12 MED ORDER — CLONIDINE HCL 0.1 MG PO TABS
0.0500 mg | ORAL_TABLET | Freq: Every day | ORAL | Status: DC
  Administered 2011-07-12: 0.05 mg via ORAL
  Filled 2011-07-12 (×2): qty 0.5

## 2011-07-12 MED ORDER — PANTOPRAZOLE SODIUM 40 MG PO TBEC
40.0000 mg | DELAYED_RELEASE_TABLET | Freq: Every day | ORAL | Status: DC
  Administered 2011-07-12 – 2011-07-13 (×2): 40 mg via ORAL
  Filled 2011-07-12 (×2): qty 1

## 2011-07-12 MED ORDER — LEVOTHYROXINE SODIUM 25 MCG PO TABS
25.0000 ug | ORAL_TABLET | Freq: Every day | ORAL | Status: DC
  Administered 2011-07-12 – 2011-07-13 (×2): 25 ug via ORAL
  Filled 2011-07-12 (×2): qty 1

## 2011-07-12 MED ORDER — ENOXAPARIN SODIUM 40 MG/0.4ML ~~LOC~~ SOLN
40.0000 mg | SUBCUTANEOUS | Status: DC
  Administered 2011-07-12: 40 mg via SUBCUTANEOUS
  Filled 2011-07-12 (×2): qty 0.4

## 2011-07-12 MED ORDER — HYDROCODONE-ACETAMINOPHEN 10-325 MG PO TABS
1.0000 | ORAL_TABLET | Freq: Three times a day (TID) | ORAL | Status: DC | PRN
  Administered 2011-07-12 – 2011-07-13 (×5): 1 via ORAL
  Filled 2011-07-12 (×5): qty 1

## 2011-07-12 MED ORDER — ALPRAZOLAM 0.25 MG PO TABS
0.2500 mg | ORAL_TABLET | Freq: Two times a day (BID) | ORAL | Status: DC
  Administered 2011-07-12 – 2011-07-13 (×4): 0.25 mg via ORAL
  Filled 2011-07-12 (×4): qty 1

## 2011-07-12 MED ORDER — PSYLLIUM 95 % PO PACK
1.0000 | PACK | Freq: Every day | ORAL | Status: DC
  Administered 2011-07-12 – 2011-07-13 (×2): 1 via ORAL
  Filled 2011-07-12 (×2): qty 1

## 2011-07-12 MED ORDER — AMLODIPINE BESYLATE 2.5 MG PO TABS
2.5000 mg | ORAL_TABLET | Freq: Every day | ORAL | Status: DC
  Administered 2011-07-12 – 2011-07-13 (×2): 2.5 mg via ORAL
  Filled 2011-07-12 (×2): qty 1

## 2011-07-12 MED ORDER — ACETAMINOPHEN 325 MG PO TABS: 650.0000 mg | ORAL_TABLET | Freq: Four times a day (QID) | ORAL | Status: DC | PRN

## 2011-07-12 MED ORDER — POTASSIUM CHLORIDE 10 MEQ/100ML IV SOLN
10.0000 meq | INTRAVENOUS | Status: AC
  Administered 2011-07-12 (×4): 10 meq via INTRAVENOUS
  Filled 2011-07-12 (×5): qty 100

## 2011-07-12 NOTE — Progress Notes (Signed)
Met with Pt and daughter.   Pt informed CSW that she is from [place] at [facility]  Pt and daughter report that Pt will not require ambulance transport upon d/c.  Notified RNCM, Rhonda.  No CSW needs identified, at this time.  Providence Crosby, LCSWA Clinical Social Work [phone]

## 2011-07-12 NOTE — ED Provider Notes (Signed)
I saw and evaluated the patient, reviewed the resident's note and I agree with the findings and plan.   .Face to face Exam:  General:  Awake HEENT:  Atraumatic Resp:  Normal effort Abd:  Nondistended Neuro:No focal weakness Lymph: No adenopathy   Wentworth Edelen L Yolanda Dockendorf, MD 07/12/11 0008 

## 2011-07-12 NOTE — Progress Notes (Signed)
Subjective: States the postprandial abd pain is decreased so far, but that she still has a constant 'soreness' that she usually does not have at baseline. she denies n/v and no diarrhea.daughter at bedside Objective: Vital signs in last 24 hours: Temp:  [97.5 F (36.4 C)-98.2 F (36.8 C)] 98.2 F (36.8 C) (06/28 0211) Pulse Rate:  [47-57] 57  (06/28 0211) Resp:  [16-20] 16  (06/28 0211) BP: (149-180)/(52-68) 180/64 mmHg (06/28 0211) SpO2:  [95 %-98 %] 98 % (06/28 0211) Weight:  [71.668 kg (158 lb)] 71.668 kg (158 lb) (06/28 0211)   Intake/Output from previous day: 06/27 0701 - 06/28 0700 In: 622.5 [I.V.:622.5] Out: -  Intake/Output this shift:      General Appearance:    Alert, cooperative, no distress, appears stated age  Lungs:     Clear to auscultation bilaterally, respirations unlabored   Heart:    Regular rate and rhythm, S1 and S2 normal, no murmur, rub   or gallop  Abdomen:     Soft, mild tenderness mostly on the right, , bowel sounds active all four quadrants, no masses, no organomegaly  Extremities:   no cyanosis or edema  Neurologic:   CNII-XII intact, normal strength, nonfocal       Weight change:   Intake/Output Summary (Last 24 hours) at 07/12/11 0931 Last data filed at 07/12/11 0600  Gross per 24 hour  Intake  622.5 ml  Output      0 ml  Net  622.5 ml    Lab Results:   Basename 07/12/11 0440 07/11/11 1613  NA 141 140  K 2.6* 2.6*  CL 102 99  CO2 27 25  GLUCOSE 89 157*  BUN 32* 41*  CREATININE 1.50* 1.84*  CALCIUM 9.0 9.4    Basename 07/11/11 1613  WBC 5.8  HGB 12.1  HCT 36.3  PLT 195  MCV 85.8   PT/INR No results found for this basename: LABPROT:2,INR:2 in the last 72 hours ABG No results found for this basename: PHART:2,PCO2:2,PO2:2,HCO3:2 in the last 72 hours  Micro Results: Recent Results (from the past 240 hour(s))  MRSA PCR SCREENING     Status: Normal   Collection Time   07/12/11  5:03 AM      Component Value Range Status  Comment   MRSA by PCR NEGATIVE  NEGATIVE Final    Studies/Results: Ct Abdomen Pelvis Wo Contrast  07/11/2011  *RADIOLOGY REPORT*  Clinical Data: Abdominal pain. Elevated lactate  CT ABDOMEN AND PELVIS WITHOUT CONTRAST  Technique:  Multidetector CT imaging of the abdomen and pelvis was performed following the standard protocol without intravenous contrast.  Comparison: None.  Findings: There is mild pleural fluid at the left lung base.  No pericardial fluid.  There is a large hiatal hernia.  Small bowel is normal.  Colon and rectosigmoid colon are normal.  Non-IV contrast images demonstrate no focal hepatic lesion.  Prior cholecystectomy.  The pancreas, spleen, and adrenal glands, and kidneys are normal.  Abdominal aorta normal caliber.  No retroperitoneal periportal lymphadenopathy.  No free fluid the pelvis.  Prior hysterectomy.  No pelvic lymphadenopathy. Review of  bone windows demonstrates no aggressive osseous lesions. There is severe disc osteophytic disease of the spine.  IMPRESSION:  1.  Large hiatal hernia.  2.  Small left effusion. 3.  No evidence of bowel obstruction or free air. 4. Severe degenerative changes of the spine.  Original Report Authenticated By: Genevive Bi, M.D.   Dg Abd Acute W/chest  07/11/2011  *RADIOLOGY  REPORT*  Clinical Data: Constipation, abdominal  ACUTE ABDOMEN SERIES (ABDOMEN 2 VIEW & CHEST 1 VIEW)  Comparison: Pain  Findings: Normal cardiac silhouette.  The aorta is ectatic. Scoliosis noted.  Lungs hyperinflated.  No free air beneath hemidiaphragms.  No dilated loops of large or small bowel.  No pathologic calcifications.  Surgical clips the right upper quadrant.  IMPRESSION:  1.  No acute cardiopulmonary findings. 2.  No evidence of bowel obstruction or free air. 3.  No evidence of constipation.  Original Report Authenticated By: Genevive Bi, M.D.   Medications: Scheduled Meds:   . sodium chloride   Intravenous Once  . ALPRAZolam  0.25 mg Oral Once  .  ALPRAZolam  0.25 mg Oral BID  . amLODipine  2.5 mg Oral Daily  . atorvastatin  10 mg Oral Daily  . cloNIDine  0.05 mg Oral QHS  . enoxaparin  40 mg Subcutaneous Q24H  . furosemide  40 mg Oral BID  . hydrALAZINE  50 mg Oral BID  . levothyroxine  25 mcg Oral Daily  . nebivolol  10 mg Oral q morning - 10a  . pantoprazole  40 mg Oral Q1200  . potassium chloride  10 mEq Intravenous Once  . potassium chloride  10 mEq Intravenous Q1 Hr x 2  . potassium chloride  10 mEq Intravenous Q1 Hr x 4  . potassium chloride  40 mEq Oral Once  . predniSONE  5 mg Oral q morning - 10a  . psyllium  1 packet Oral Daily  . sertraline  25 mg Oral Daily  . DISCONTD:  morphine injection  1 mg Intravenous Once   Continuous Infusions:  PRN Meds:.acetaminophen, acetaminophen, HYDROcodone-acetaminophen, HYDROcodone-acetaminophen, polyethylene glycol Assessment/Plan: Patient Active Hospital Problem List: Abdominal pain, acute  -In pt presenting with post prandial pain, and with lactic acidosis> ?mesenteric ischemia  -again discussed the possibility of mesenteric ischemia with pt and daughter and she confirms that she does not want to consider any surgical options, and also does not want any invasive testing  if the only option for treating the findings is surgical. -I have consulted GI and Dr Bosie Clos to see in am -will continue pain/conservative management and follow Lactic acidosis  -concerning for possible mesenteric ischemia, and further eval would be by CTA or MRA, and I discussed this with pt and daughter and they state since pt does not want to consider any surgical options as above, -will repeat lactic acid level and follow -GI consulted as above for further recs -pt has no evidence of infection and is not hypotensive, and her renal function -cr 1.5 today is better an than her baseline of 1.8/1.9  Hypokalemia  -replace k, follow and recheck. Mg level is WNL Colitis, collagenous   -followed by Dr Randa Evens,  consulted [facility] and Dr Bosie Clos to see pt in am  CKD (chronic kidney disease), stage IV -Cr improving    Hypothyroidism  -TSH wnl at 2.51, continue current synthroid HTN (hypertension) -continue out pt meds,monitor and further treat accordingly  H/o PMR -continue prednisone     LOS: 1 day   Adajah Cocking C 07/12/2011, 9:31 AM

## 2011-07-12 NOTE — Progress Notes (Signed)
CARE MANAGEMENT NOTE 07/12/2011  Patient:  Christina Cabrera, Christina Cabrera   Account Number:  [id number]  Date Initiated:  07/12/2011  Documentation initiated by:  Donnielle Addison  Subjective/Objective Assessment:   [t with lw postassioum,elevated lipase and abvd pain     Action/Plan:   [facility]   Anticipated DC Date:  07/15/2011   Anticipated DC Plan:  HOME/SELF CARE  In-house referral  NA      DC Planning Services  NA      [facility] Choice  NA   Choice offered to / List presented to:  NA   DME arranged  NA      DME agency  NA     HH arranged  NA      HH agency  NA   Status of service:  In process, will continue to follow Medicare Important Message given?   (If response is "NO", the following Medicare IM given date fields will be blank) Date Medicare IM given:   Date Additional Medicare IM given:    Discharge Disposition:    Per UR Regulation:  Reviewed for med. necessity/level of care/duration of stay  If discussed at Long Length of Stay Meetings, dates discussed:    Comments:  [id number]/Duffy Dantonio Earlene Plater, RN, BSN, CCM Chart and patient information reviewed no discharge needs present at this time. Case Management [phone]

## 2011-07-13 DIAGNOSIS — N184 Chronic kidney disease, stage 4 (severe): Secondary | ICD-10-CM

## 2011-07-13 DIAGNOSIS — E876 Hypokalemia: Secondary | ICD-10-CM

## 2011-07-13 DIAGNOSIS — K559 Vascular disorder of intestine, unspecified: Secondary | ICD-10-CM | POA: Diagnosis not present

## 2011-07-13 DIAGNOSIS — R1084 Generalized abdominal pain: Secondary | ICD-10-CM

## 2011-07-13 DIAGNOSIS — E872 Acidosis: Secondary | ICD-10-CM

## 2011-07-13 LAB — BASIC METABOLIC PANEL
Calcium: 9.4 mg/dL (ref 8.4–10.5)
Creatinine, Ser: 1.71 mg/dL — ABNORMAL HIGH (ref 0.50–1.10)
GFR calc Af Amer: 29 mL/min — ABNORMAL LOW (ref >90)
GFR calc non Af Amer: 25 mL/min — ABNORMAL LOW (ref >90)

## 2011-07-13 MED ORDER — HYDROCODONE-ACETAMINOPHEN 10-325 MG PO TABS: 1.0000 | ORAL_TABLET | Freq: Three times a day (TID) | ORAL | Status: DC | PRN

## 2011-07-13 MED ORDER — POLYETHYLENE GLYCOL 3350 17 G PO PACK: 17.0000 g | PACK | Freq: Every day | ORAL | Status: AC | PRN

## 2011-07-13 MED ORDER — POTASSIUM CHLORIDE CRYS ER 20 MEQ PO TBCR
40.0000 meq | EXTENDED_RELEASE_TABLET | Freq: Once | ORAL | Status: AC
  Administered 2011-07-13: 40 meq via ORAL
  Filled 2011-07-13: qty 2

## 2011-07-13 MED ORDER — ENOXAPARIN SODIUM 30 MG/0.3ML ~~LOC~~ SOLN: 30.0000 mg | SUBCUTANEOUS | Status: DC

## 2011-07-13 NOTE — Progress Notes (Signed)
Pt d/c instructions, medications and follow up appts given to pt.  Pt verbalized understanding. Pt d/ced back to independent living facility. Daughter provided transportation.

## 2011-07-13 NOTE — Consult Note (Signed)
Referring Provider: Dr. Suanne Marker Primary Care Physician:  Mickie Hillier, MD Primary Gastroenterologist:  Dr. Randa Evens  Reason for Consultation:  Abdominal pain  HPI: Christina Cabrera is a 78 y.o. pleasant female with her daughter at the bedside who I was called to see last night by Dr. Suanne Marker. She sees Dr. Randa Evens and has a history of collagenous colitis and chronic abdominal pain but reports that she has been having constipation for a while and has been using Citrucel on a regular basis. She was admitted for severe postprandial abdominal pain that was diffuse but greatest on the right. Pain that occurred prior to admit was acute sharp pain throughout her abdomen that occurred after eating a meal. Pain was 10/10 and persisted for hours until it settled down while being evaluated in the ER to her baseline abdominal pain level. Her usual abdominal pain occurs during her meal but it not nearly as severe as the pain that led her to come to the ER. She denies having an episode of intense abdominal pain before like she had when she came in for this admit. No associated nausea, vomiting, diarrhea, rectal bleeding, dizziness, chest pain, dysuria. Lactic acid was 3.4 on admit. Noncontrasted CT showed no free air or bowel obstruction. Reports she has back pain when she strains to move her bowels so tries not to strain and has a bulb syringe to help evacuate her stool.     Past Medical History  Diagnosis Date  . Hypertension   . Arthritis   . Diverticulitis   . Irritable bowel   . PMR (polymyalgia rheumatica)   . Spinal stenosis   . Thyroid disease     Past Surgical History  Procedure Date  . Back surgery     Prior to Admission medications   Medication Sig Start Date End Date Taking? Authorizing Provider  ALPRAZolam Prudy Feeler) 0.5 MG tablet Take 0.25 mg by mouth 2 (two) times daily.   Yes Historical Provider, MD  amLODipine (NORVASC) 2.5 MG tablet Take 2.5 mg by mouth daily.   Yes Historical  Provider, MD  atorvastatin (LIPITOR) 10 MG tablet Take 10 mg by mouth daily.   Yes Historical Provider, MD  cloNIDine (CATAPRES) 0.1 MG tablet Take 0.05 mg by mouth at bedtime. Pt takes 1/2 tab for 0.05mg  dose   Yes Historical Provider, MD  furosemide (LASIX) 40 MG tablet Take 40 mg by mouth 2 (two) times daily.   Yes Historical Provider, MD  hydrALAZINE (APRESOLINE) 50 MG tablet Take 50 mg by mouth 2 (two) times daily.   Yes Historical Provider, MD  HYDROcodone-acetaminophen (NORCO) 10-325 MG per tablet Take 1 tablet by mouth every 8 (eight) hours as needed. pain   Yes Historical Provider, MD  Ketotifen Fumarate (ALLERGY EYE DROPS OP) Apply 1 drop to eye 2 (two) times daily.   Yes Historical Provider, MD  levothyroxine (SYNTHROID, LEVOTHROID) 50 MCG tablet Take 25 mcg by mouth daily. Pt takes 1/2 tab for dose   Yes Historical Provider, MD  methylcellulose (CITRUCEL) oral powder Take 1 packet by mouth daily.   Yes Historical Provider, MD  nebivolol (BYSTOLIC) 10 MG tablet Take 10 mg by mouth every morning.   Yes Historical Provider, MD  omeprazole (PRILOSEC) 20 MG capsule Take 20 mg by mouth daily.   Yes Historical Provider, MD  predniSONE (DELTASONE) 5 MG tablet Take 5 mg by mouth every morning.   Yes Historical Provider, MD  sertraline (ZOLOFT) 25 MG tablet Take 25 mg by mouth  daily.   Yes Historical Provider, MD    Scheduled Meds:   . ALPRAZolam  0.25 mg Oral BID  . amLODipine  2.5 mg Oral Daily  . atorvastatin  10 mg Oral Daily  . cloNIDine  0.05 mg Oral QHS  . enoxaparin  40 mg Subcutaneous Q24H  . furosemide  40 mg Oral BID  . hydrALAZINE  50 mg Oral BID  . levothyroxine  25 mcg Oral Daily  . nebivolol  10 mg Oral q morning - 10a  . pantoprazole  40 mg Oral Q1200  . potassium chloride  10 mEq Intravenous Q1 Hr x 4  . potassium chloride  40 mEq Oral Once  . predniSONE  5 mg Oral q morning - 10a  . psyllium  1 packet Oral Daily  . sertraline  25 mg Oral Daily   Continuous  Infusions:  PRN Meds:.acetaminophen, acetaminophen, bisacodyl, HYDROcodone-acetaminophen, polyethylene glycol  Allergies as of 07/11/2011 - never reviewed  Allergen Reaction Noted  . Quinolones Other (See Comments) 07/11/2011    History reviewed. No pertinent family history.  History   Social History  . Marital Status: Widowed    Spouse Name: N/A    Number of Children: N/A  . Years of Education: N/A   Occupational History  . Not on file.   Social History Main Topics  . Smoking status: Never Smoker   . Smokeless tobacco: Never Used  . Alcohol Use: No  . Drug Use: No  . Sexually Active: No   Other Topics Concern  . Not on file   Social History Narrative  . No narrative on file    Review of Systems: All negative except as stated above in HPI.  Physical Exam: Vital signs: Filed Vitals:   07/13/11 0605  BP: 151/65  Pulse: 49  Temp: 97.6 F (36.4 C)  Resp: 16   Last BM Date: 07/12/11 General:   Alert,  Elderly, Well-developed, well-nourished, pleasant and cooperative in NAD Skin: scattered ecchymoses noted on extremities HEENT: oral pharynx clear, no mouth lesions Neck: supple, nontender Lungs:  Clear throughout to auscultation.   No wheezes, crackles, or rhonchi. No acute distress. Heart:  Regular rate and rhythm; no murmurs, clicks, rubs,  or gallops. Abdomen: Right-sided tenderness with guarding, minimal left-sided tenderness, soft, nondistended, +BS  Rectal:  Deferred Ext: no edema  GI:  Lab Results:  Basename 07/11/11 1613  WBC 5.8  HGB 12.1  HCT 36.3  PLT 195   BMET  Basename 07/13/11 0546 07/12/11 0440 07/11/11 1613  NA 139 141 140  K 3.1* 2.6* 2.6*  CL 101 102 99  CO2 27 27 25   GLUCOSE 91 89 157*  BUN 30* 32* 41*  CREATININE 1.71* 1.50* 1.84*  CALCIUM 9.4 9.0 9.4   LFT  Basename 07/11/11 1613  PROT 6.8  ALBUMIN 3.7  AST 29  ALT 15  ALKPHOS 54  BILITOT 0.4  BILIDIR --  IBILI --   PT/INR No results found for this basename:  LABPROT:2,INR:2 in the last 72 hours   Studies/Results: Ct Abdomen Pelvis Wo Contrast  07/11/2011  *RADIOLOGY REPORT*  Clinical Data: Abdominal pain. Elevated lactate  CT ABDOMEN AND PELVIS WITHOUT CONTRAST  Technique:  Multidetector CT imaging of the abdomen and pelvis was performed following the standard protocol without intravenous contrast.  Comparison: None.  Findings: There is mild pleural fluid at the left lung base.  No pericardial fluid.  There is a large hiatal hernia.  Small bowel is normal.  Colon  and rectosigmoid colon are normal.  Non-IV contrast images demonstrate no focal hepatic lesion.  Prior cholecystectomy.  The pancreas, spleen, and adrenal glands, and kidneys are normal.  Abdominal aorta normal caliber.  No retroperitoneal periportal lymphadenopathy.  No free fluid the pelvis.  Prior hysterectomy.  No pelvic lymphadenopathy. Review of  bone windows demonstrates no aggressive osseous lesions. There is severe disc osteophytic disease of the spine.  IMPRESSION:  1.  Large hiatal hernia.  2.  Small left effusion. 3.  No evidence of bowel obstruction or free air. 4. Severe degenerative changes of the spine.  Original Report Authenticated By: Genevive Bi, M.D.   Dg Abd Acute W/chest  07/11/2011  *RADIOLOGY REPORT*  Clinical Data: Constipation, abdominal  ACUTE ABDOMEN SERIES (ABDOMEN 2 VIEW & CHEST 1 VIEW)  Comparison: Pain  Findings: Normal cardiac silhouette.  The aorta is ectatic. Scoliosis noted.  Lungs hyperinflated.  No free air beneath hemidiaphragms.  No dilated loops of large or small bowel.  No pathologic calcifications.  Surgical clips the right upper quadrant.  IMPRESSION:  1.  No acute cardiopulmonary findings. 2.  No evidence of bowel obstruction or free air. 3.  No evidence of constipation.  Original Report Authenticated By: Genevive Bi, M.D.    Impression/Plan: 78yo with severe postprandial abdominal pain in the setting of chronic less intense abdominal pain.  Lactic acid elevated (3.8) yesterday. She has risk factors for mesenteric ischemia and I think she has some form of mesenteric ischemia. Doubt bowel necrosis is present at this time. Long discussion with daughter and patient about options for evaluation and treatment and options are all invasive. She would need a CT angiogram to look for stenoses and other vascular impediments to flow but she cannot undergo that test because of her renal insufficiency. Would not recommend a CTA even if kidney function was ok because she does not want any surgery or invasive tests done. Despite the rise in lactic acid levels, clinically she is better since she is able to eat food without worsened pain. She tolerated food this morning and yesterday and says she feels sore but is not having the level of pain she had when she came into the hospital. She needs to eat small portion meals at home and use Miralax Q 2-3 days to try and avoid constipation. Correct electrolytes per primary team. I answered all of her daughter's questions. If her K is corrected and she continues to tolerate a diet, then she can go home today or tomorrow from a GI standpoint. She is always going to be at risk for another ischemic-like episode. No treatment options that are not invasive and they understand that. Supportive care. Collagenous colitis is not an issue since she is having constipation and not diarrhea. F/U with Dr. Randa Evens prn. Will sign off. Call with questions.    LOS: 2 days   Rozalynn Buege C.  07/13/2011, 8:29 AM

## 2011-07-13 NOTE — Discharge Summary (Signed)
Discharge Note  Name: Christina Cabrera MRN: [id number] DOB: Jun 10, 1917 78 y.o.  Date of Admission: 07/11/2011  2:28 PM Date of Discharge: 07/13/2011 Attending Physician: Kela Millin, MD  Discharge Diagnosis: Active Problems:  Abdominal pain, acute  Lactic acidosis  Hypokalemia  Colitis, collagenous  CKD (chronic kidney disease), stage IV  Hypothyroidism  HTN (hypertension)   Discharge Medications: Medication List  As of 07/13/2011  3:31 PM   TAKE these medications         ALLERGY EYE DROPS OP   Apply 1 drop to eye 2 (two) times daily.      ALPRAZolam 0.5 MG tablet   Commonly known as: XANAX   Take 0.25 mg by mouth 2 (two) times daily.      amLODipine 2.5 MG tablet   Commonly known as: NORVASC   Take 2.5 mg by mouth daily.      atorvastatin 10 MG tablet   Commonly known as: LIPITOR   Take 10 mg by mouth daily.      CITRUCEL oral powder   Generic drug: methylcellulose   Take 1 packet by mouth daily.      cloNIDine 0.1 MG tablet   Commonly known as: CATAPRES   Take 0.05 mg by mouth at bedtime. Pt takes 1/2 tab for 0.05mg  dose      furosemide 40 MG tablet   Commonly known as: LASIX   Take 40 mg by mouth 2 (two) times daily.      hydrALAZINE 50 MG tablet   Commonly known as: APRESOLINE   Take 50 mg by mouth 2 (two) times daily.      HYDROcodone-acetaminophen 10-325 MG per tablet   Commonly known as: NORCO   Take 1 tablet by mouth every 8 (eight) hours as needed. pain      levothyroxine 50 MCG tablet   Commonly known as: SYNTHROID, LEVOTHROID   Take 25 mcg by mouth daily. Pt takes 1/2 tab for dose      nebivolol 10 MG tablet   Commonly known as: BYSTOLIC   Take 10 mg by mouth every morning.      omeprazole 20 MG capsule   Commonly known as: PRILOSEC   Take 20 mg by mouth daily.      polyethylene glycol packet   Commonly known as: MIRALAX / GLYCOLAX   Take 17 g by mouth daily as needed.      predniSONE 5 MG tablet   Commonly known as:  DELTASONE   Take 5 mg by mouth every morning.      sertraline 25 MG tablet   Commonly known as: ZOLOFT   Take 25 mg by mouth daily.            Disposition and follow-up:   [name] was discharged from [facility] in improved/stable condition.    Follow-up Appointments: Discharge Orders    Future Orders Please Complete By Expires   Diet - low sodium heart healthy      Increase activity slowly         Consultations: Treatment Team:  Shirley Friar, MD  Procedures Performed:  Ct Abdomen Pelvis Wo Contrast  07/11/2011  *RADIOLOGY REPORT*  Clinical Data: Abdominal pain. Elevated lactate  CT ABDOMEN AND PELVIS WITHOUT CONTRAST  Technique:  Multidetector CT imaging of the abdomen and pelvis was performed following the standard protocol without intravenous contrast.  Comparison: None.  Findings: There is mild pleural fluid at the left lung base.  No pericardial fluid.  There is a large hiatal hernia.  Small bowel is normal.  Colon and rectosigmoid colon are normal.  Non-IV contrast images demonstrate no focal hepatic lesion.  Prior cholecystectomy.  The pancreas, spleen, and adrenal glands, and kidneys are normal.  Abdominal aorta normal caliber.  No retroperitoneal periportal lymphadenopathy.  No free fluid the pelvis.  Prior hysterectomy.  No pelvic lymphadenopathy. Review of  bone windows demonstrates no aggressive osseous lesions. There is severe disc osteophytic disease of the spine.  IMPRESSION:  1.  Large hiatal hernia.  2.  Small left effusion. 3.  No evidence of bowel obstruction or free air. 4. Severe degenerative changes of the spine.  Original Report Authenticated By: Genevive Bi, M.D.   Dg Abd Acute W/chest  07/11/2011  *RADIOLOGY REPORT*  Clinical Data: Constipation, abdominal  ACUTE ABDOMEN SERIES (ABDOMEN 2 VIEW & CHEST 1 VIEW)  Comparison: Pain  Findings: Normal cardiac silhouette.  The aorta is ectatic. Scoliosis noted.  Lungs  hyperinflated.  No free air beneath hemidiaphragms.  No dilated loops of large or small bowel.  No pathologic calcifications.  Surgical clips the right upper quadrant.  IMPRESSION:  1.  No acute cardiopulmonary findings. 2.  No evidence of bowel obstruction or free air. 3.  No evidence of constipation.  Original Report Authenticated By: Genevive Bi, M.D.     Admission HPI History of Present Illness: This is a very pleasant 78 year old female with a history of collagenous colitis who has chronic abdominal pain and back pain. The patient states that after eating her meal today she had an acute onset of acute sharp abdominal pain extending from the left upper quadrant of the right upper quadrant. She states that the pain was at least 10/10 at the time of its occurrence and continued until she came to the emergency room. Several hours after arriving to the emergency room that acute pain has resolved and she now is back to her chronic pain which she states is a chronic aching in that as a 3/10. The patient also states that she has chronic low back pain which she manages with pain medications.  The patient states that she has been having some postprandial pain for some time however it is not as intense as it was today. She states that the pain occupied her consciousness at the time to the extent that she's unable to identify any associated symptoms. She denies any specific palliative or provocative features. She denies any chest pain, dizziness, near syncope, loss of consciousness, palpitations, dysuria, frequency. She states that she suffers from constipation and has to use a bulb syringe to assist evacuation of her stool or regular basis.  In the emergency room the patient was noted to have an elevated lactic acid and to decrease potassium. We are asked to see the patient for further evaluation and management.  She was admitted for further evaluation and management. On followup today she states she is  tolerating by mouth's well without worsened pain. She had a bowel movement last night and this morning. Exam General Appearance:  Alert, cooperative, no distress, appears stated age   Lungs:  Clear to auscultation bilaterally, respirations unlabored   Heart:  Regular rate and rhythm, S1 and S2 normal, no murmur, rub  or gallop   Abdomen:  Soft, mild tenderness mostly on the right, , bowel sounds active all four quadrants, no masses, no organomegaly   Extremities:  no cyanosis or edema   Neurologic:  CNII-XII intact, normal  strength, nonfocal      Hospital Course by problem list: Active Problems:  Abdominal pain, acute  Lactic acidosis  Hypokalemia  Colitis, collagenous  CKD (chronic kidney disease), stage IV  Hypothyroidism  HTN (hypertension)  Abdominal pain, acute  -In pt presenting with post prandial pain, and with lactic acidosis> ? Concerning for mesenteric ischemia -She had a CT scan of the abdomen and pelvis on admission which showed a large hiatal hernia and small left effusion no evidence of bowel obstruction or free air.  -the admitting physician discussed the concern about mesenteric ischemia with patient and she stated that she did not want any invasive testing or treatments. On followup I again discussed the possibility of mesenteric ischemia with pt and daughter and she confirmed that she does not want to consider any surgical options, and also does not want any invasive testing if the only option for treating the findings is surgical.  -GI was consulted per family's request and Dr. Bosie Clos saw patient today and his impression is that he doubted bowel necrosis is present in the he would not recommend a CT angiogram in the the kidney was okay since the patient did not want any surgery or invasive tests and done. Per Dr. Bosie Clos if patient is tolerating and diet and he is corrected she can go home today from GI standpoint. Patient has received adequate oral potassium  supplementation today, is tolerating by mouth and wants to go home She is to follow outpatient with her PCP and with Dr. Randa Evens as needed  Lactic acidosis  -concerning for possible mesenteric ischemia, and further eval would be by CTA or MRA, and I discussed this with pt and daughter and they state since pt does not want to consider any surgical options as above,  -Per Dr. Bosie Clos despite the rise in the patient's lactic acid levels clinically she is better since she is able to eat food without worsened pain and I agree. -pt has no evidence of infection and is not hypotensive, and her renal function -cr 1.5 today is better an than her baseline of 1.8/1.9  Hypokalemia  -Her potassium was replaced during this hospital stay. Mg level is WNL. She is to have a followup Bmet done when she sees her PCP this next week.  Colitis, collagenous  -She's not having diarrhea at this time, follow up outpatient.  CKD (chronic kidney disease), stage IV  -Creatinine stable, her baselineas noted on admission is 1.8/1.9  Hypothyroidism  -TSH wnl at 2.51, continue current synthroid  HTN (hypertension)  -continue out pt meds,monitor and further treat accordingly  H/o PMR  -continue prednisone   Discharge Vitals:  BP 103/50  Pulse 52  Temp 97.9 F (36.6 C) (Oral)  Resp 18  Ht 5\' 4"  (1.626 m)  Wt 71.668 kg (158 lb)  BMI 27.12 kg/m2  SpO2 95%  Discharge Labs:  Results for orders placed during the hospital encounter of 07/11/11 (from the past 24 hour(s))  BASIC METABOLIC PANEL     Status: Abnormal   Collection Time   07/13/11  5:46 AM      Component Value Range   Sodium 139  135 - 145 mEq/L   Potassium 3.1 (*) 3.5 - 5.1 mEq/L   Chloride 101  96 - 112 mEq/L   CO2 27  19 - 32 mEq/L   Glucose, Bld 91  70 - 99 mg/dL   BUN 30 (*) 6 - 23 mg/dL   Creatinine, Ser 1.61 (*) 0.50 - 1.10  mg/dL   Calcium 9.4  8.4 - 78.2 mg/dL   GFR calc non Af Amer 25 (*) >90 mL/min   GFR calc Af Amer 29 (*) >90 mL/min     Signed: Brownie Nehme C 07/13/2011, 3:31 PM

## 2011-07-13 NOTE — Progress Notes (Signed)
Lovenox - Pharmacy may adjust dose  Pt on Lovenox 40mg  sq q24h. CrCl 20.30ml/min. Will reduce dose to 30mg  sq q24h given CrCl <26ml/min  Gwen Her PharmD  [phone] 07/13/2011 12:58 PM

## 2011-07-13 NOTE — Progress Notes (Signed)
CSW was contacted by unit concerning d/c plan for Pt.   Pt is a resident of [facility].   CSW spoke with facility to confirm d/c plan.   Nurse gave report and confirmed Pt was able to maintain self-care.   No further needs at this time.  Leron Croak, LCSWA [employer] [phone]

## 2011-07-17 DIAGNOSIS — E039 Hypothyroidism, unspecified: Secondary | ICD-10-CM | POA: Diagnosis not present

## 2011-07-17 DIAGNOSIS — M545 Low back pain: Secondary | ICD-10-CM | POA: Diagnosis not present

## 2011-07-17 DIAGNOSIS — R109 Unspecified abdominal pain: Secondary | ICD-10-CM | POA: Diagnosis not present

## 2011-07-17 DIAGNOSIS — E876 Hypokalemia: Secondary | ICD-10-CM | POA: Diagnosis not present

## 2011-08-09 DIAGNOSIS — E876 Hypokalemia: Secondary | ICD-10-CM | POA: Diagnosis not present

## 2011-09-10 DIAGNOSIS — E875 Hyperkalemia: Secondary | ICD-10-CM | POA: Diagnosis not present

## 2011-09-10 DIAGNOSIS — I129 Hypertensive chronic kidney disease with stage 1 through stage 4 chronic kidney disease, or unspecified chronic kidney disease: Secondary | ICD-10-CM | POA: Diagnosis not present

## 2011-09-10 DIAGNOSIS — I10 Essential (primary) hypertension: Secondary | ICD-10-CM | POA: Diagnosis not present

## 2011-09-10 DIAGNOSIS — E876 Hypokalemia: Secondary | ICD-10-CM | POA: Diagnosis not present

## 2011-09-10 DIAGNOSIS — N184 Chronic kidney disease, stage 4 (severe): Secondary | ICD-10-CM | POA: Diagnosis not present

## 2011-11-23 DIAGNOSIS — Z23 Encounter for immunization: Secondary | ICD-10-CM | POA: Diagnosis not present

## 2011-12-09 DIAGNOSIS — K559 Vascular disorder of intestine, unspecified: Secondary | ICD-10-CM | POA: Diagnosis not present

## 2011-12-09 DIAGNOSIS — M899 Disorder of bone, unspecified: Secondary | ICD-10-CM | POA: Diagnosis not present

## 2011-12-09 DIAGNOSIS — M353 Polymyalgia rheumatica: Secondary | ICD-10-CM | POA: Diagnosis not present

## 2011-12-09 DIAGNOSIS — N184 Chronic kidney disease, stage 4 (severe): Secondary | ICD-10-CM | POA: Diagnosis not present

## 2011-12-09 DIAGNOSIS — E782 Mixed hyperlipidemia: Secondary | ICD-10-CM | POA: Diagnosis not present

## 2011-12-09 DIAGNOSIS — Z0000 Encounter for general adult medical examination without abnormal findings: Secondary | ICD-10-CM | POA: Diagnosis not present

## 2011-12-09 DIAGNOSIS — I10 Essential (primary) hypertension: Secondary | ICD-10-CM | POA: Diagnosis not present

## 2011-12-09 DIAGNOSIS — E039 Hypothyroidism, unspecified: Secondary | ICD-10-CM | POA: Diagnosis not present

## 2011-12-09 DIAGNOSIS — Z1331 Encounter for screening for depression: Secondary | ICD-10-CM | POA: Diagnosis not present

## 2012-02-10 DIAGNOSIS — E876 Hypokalemia: Secondary | ICD-10-CM | POA: Diagnosis not present

## 2012-03-12 DIAGNOSIS — N184 Chronic kidney disease, stage 4 (severe): Secondary | ICD-10-CM | POA: Diagnosis not present

## 2012-06-01 DIAGNOSIS — Z85828 Personal history of other malignant neoplasm of skin: Secondary | ICD-10-CM | POA: Diagnosis not present

## 2012-06-01 DIAGNOSIS — L578 Other skin changes due to chronic exposure to nonionizing radiation: Secondary | ICD-10-CM | POA: Diagnosis not present

## 2012-06-01 DIAGNOSIS — L570 Actinic keratosis: Secondary | ICD-10-CM | POA: Diagnosis not present

## 2012-06-01 DIAGNOSIS — L723 Sebaceous cyst: Secondary | ICD-10-CM | POA: Diagnosis not present

## 2012-06-01 DIAGNOSIS — D485 Neoplasm of uncertain behavior of skin: Secondary | ICD-10-CM | POA: Diagnosis not present

## 2012-06-02 ENCOUNTER — Encounter (HOSPITAL_COMMUNITY): Payer: Self-pay | Admitting: Emergency Medicine

## 2012-06-02 ENCOUNTER — Emergency Department (HOSPITAL_COMMUNITY)
Admission: EM | Admit: 2012-06-02 | Discharge: 2012-06-02 | Disposition: A | Discharge status: Home / Self Care | Payer: Medicare Other | Attending: Emergency Medicine | Admitting: Emergency Medicine

## 2012-06-02 ENCOUNTER — Emergency Department (HOSPITAL_COMMUNITY): Payer: Medicare Other

## 2012-06-02 DIAGNOSIS — K589 Irritable bowel syndrome without diarrhea: Secondary | ICD-10-CM | POA: Diagnosis not present

## 2012-06-02 DIAGNOSIS — R6883 Chills (without fever): Secondary | ICD-10-CM | POA: Diagnosis not present

## 2012-06-02 DIAGNOSIS — IMO0002 Reserved for concepts with insufficient information to code with codable children: Secondary | ICD-10-CM | POA: Insufficient documentation

## 2012-06-02 DIAGNOSIS — E876 Hypokalemia: Secondary | ICD-10-CM

## 2012-06-02 DIAGNOSIS — N289 Disorder of kidney and ureter, unspecified: Secondary | ICD-10-CM | POA: Insufficient documentation

## 2012-06-02 DIAGNOSIS — M129 Arthropathy, unspecified: Secondary | ICD-10-CM | POA: Insufficient documentation

## 2012-06-02 DIAGNOSIS — J90 Pleural effusion, not elsewhere classified: Secondary | ICD-10-CM | POA: Diagnosis not present

## 2012-06-02 DIAGNOSIS — I10 Essential (primary) hypertension: Secondary | ICD-10-CM | POA: Diagnosis not present

## 2012-06-02 DIAGNOSIS — IMO0001 Reserved for inherently not codable concepts without codable children: Secondary | ICD-10-CM | POA: Insufficient documentation

## 2012-06-02 DIAGNOSIS — E079 Disorder of thyroid, unspecified: Secondary | ICD-10-CM | POA: Diagnosis not present

## 2012-06-02 DIAGNOSIS — Z8719 Personal history of other diseases of the digestive system: Secondary | ICD-10-CM | POA: Insufficient documentation

## 2012-06-02 DIAGNOSIS — M353 Polymyalgia rheumatica: Secondary | ICD-10-CM | POA: Insufficient documentation

## 2012-06-02 DIAGNOSIS — M4800 Spinal stenosis, site unspecified: Secondary | ICD-10-CM | POA: Diagnosis not present

## 2012-06-02 DIAGNOSIS — Z79899 Other long term (current) drug therapy: Secondary | ICD-10-CM | POA: Diagnosis not present

## 2012-06-02 DIAGNOSIS — R509 Fever, unspecified: Secondary | ICD-10-CM | POA: Diagnosis not present

## 2012-06-02 DIAGNOSIS — R52 Pain, unspecified: Secondary | ICD-10-CM | POA: Diagnosis not present

## 2012-06-02 LAB — CBC WITH DIFFERENTIAL/PLATELET
Basophils Absolute: 0 10*3/uL (ref 0.0–0.1)
Eosinophils Absolute: 0 10*3/uL (ref 0.0–0.7)
Lymphocytes Relative: 18 % (ref 12–46)
Lymphs Abs: 1.6 10*3/uL (ref 0.7–4.0)
Neutrophils Relative %: 58 % (ref 43–77)
Platelets: 170 10*3/uL (ref 150–400)
RBC: 4.27 MIL/uL (ref 3.87–5.11)
RDW: 14.6 % (ref 11.5–15.5)
WBC: 8.8 10*3/uL (ref 4.0–10.5)

## 2012-06-02 LAB — LACTIC ACID, PLASMA: Lactic Acid, Venous: 1.2 mmol/L (ref 0.5–2.2)

## 2012-06-02 LAB — URINALYSIS, ROUTINE W REFLEX MICROSCOPIC
Bilirubin Urine: NEGATIVE
Ketones, ur: NEGATIVE mg/dL
Leukocytes, UA: NEGATIVE
Nitrite: NEGATIVE
Protein, ur: NEGATIVE mg/dL
Urobilinogen, UA: 1 mg/dL (ref 0.0–1.0)
pH: 7.5 (ref 5.0–8.0)

## 2012-06-02 LAB — BASIC METABOLIC PANEL
CO2: 27 mEq/L (ref 19–32)
Calcium: 9 mg/dL (ref 8.4–10.5)
GFR calc non Af Amer: 31 mL/min — ABNORMAL LOW (ref >90)
Glucose, Bld: 96 mg/dL (ref 70–99)
Potassium: 2.8 mEq/L — ABNORMAL LOW (ref 3.5–5.1)
Sodium: 141 mEq/L (ref 135–145)

## 2012-06-02 LAB — TROPONIN I: Troponin I: <0.3 ng/mL (ref <0.30)

## 2012-06-02 MED ORDER — POTASSIUM CHLORIDE CRYS ER 20 MEQ PO TBCR: 20.0000 meq | EXTENDED_RELEASE_TABLET | Freq: Every day | ORAL | Status: DC

## 2012-06-02 MED ORDER — LEVOFLOXACIN 500 MG PO TABS: 500.0000 mg | ORAL_TABLET | Freq: Every day | ORAL | Status: DC

## 2012-06-02 MED ORDER — SODIUM CHLORIDE 0.9 % IV SOLN
1000.0000 mL | INTRAVENOUS | Status: DC
  Administered 2012-06-02: 1000 mL via INTRAVENOUS

## 2012-06-02 MED ORDER — WHITE PETROLATUM GEL
Status: DC | PRN
  Filled 2012-06-02: qty 5

## 2012-06-02 MED ORDER — HYDROCODONE-ACETAMINOPHEN 5-325 MG PO TABS
1.0000 | ORAL_TABLET | Freq: Once | ORAL | Status: AC
  Administered 2012-06-02: 1 via ORAL

## 2012-06-02 MED ORDER — SODIUM CHLORIDE 0.9 % IV SOLN
1000.0000 mL | Freq: Once | INTRAVENOUS | Status: AC
  Administered 2012-06-02: 1000 mL via INTRAVENOUS

## 2012-06-02 MED ORDER — HYDROCODONE-ACETAMINOPHEN 5-325 MG PO TABS
2.0000 | ORAL_TABLET | Freq: Once | ORAL | Status: DC
  Filled 2012-06-02: qty 2

## 2012-06-02 MED ORDER — POTASSIUM CHLORIDE CRYS ER 20 MEQ PO TBCR
40.0000 meq | EXTENDED_RELEASE_TABLET | Freq: Once | ORAL | Status: AC
  Administered 2012-06-02: 40 meq via ORAL
  Filled 2012-06-02: qty 2

## 2012-06-02 MED ORDER — LEVOFLOXACIN 500 MG PO TABS
500.0000 mg | ORAL_TABLET | Freq: Once | ORAL | Status: AC
  Administered 2012-06-02: 500 mg via ORAL
  Filled 2012-06-02: qty 1

## 2012-06-02 NOTE — ED Provider Notes (Signed)
History     CSN: [id number]  Arrival date & time 06/02/12  1191   First MD Initiated Contact with Patient 06/02/12 [phone]      Chief Complaint  Patient presents with  . Generalized Body Aches  . Chills    The history is provided by the patient and a relative.   patient reports developing generalized body aches and chills that began last night.  No documented fever.  No nausea vomiting or diarrhea.  No chest pain or shortness of breath.  No abdominal discomfort.  No altered mental status.  She had recent shave biopsy of her left cheek for an abnormal skin growth evaluating her for cancer.  Diagnosis unclear at this time.  She is 79 years old and otherwise functional at her assisted living Center.  She is a retired Engineer, civil (consulting).  Her symptoms are mild in severity.  Nothing worsens or improves her symptoms.  No new rash noted.  No increasing pain and discoloration around her recent shave biopsy site on her left cheek.  Past Medical History  Diagnosis Date  . Hypertension   . Arthritis   . Diverticulitis   . Irritable bowel   . PMR (polymyalgia rheumatica)   . Spinal stenosis   . Thyroid disease     Past Surgical History  Procedure Laterality Date  . Back surgery      No family history on file.  History  Substance Use Topics  . Smoking status: Never Smoker   . Smokeless tobacco: Never Used  . Alcohol Use: No    OB History   Grav Para Term Preterm Abortions TAB SAB Ect Mult Living                  Review of Systems  All other systems reviewed and are negative.    Allergies  Quinolones  Home Medications   Current Outpatient Rx  Name  Route  Sig  Dispense  Refill  . ALPRAZolam (XANAX) 0.5 MG tablet   Oral   Take 0.25 mg by mouth 3 (three) times daily.          Marland Kitchen amLODipine (NORVASC) 2.5 MG tablet   Oral   Take 2.5 mg by mouth daily.         . Cholecalciferol (VITAMIN D) 2000 UNITS CAPS   Oral   Take 2,000 Units by mouth daily.         . cloNIDine  (CATAPRES) 0.1 MG tablet   Oral   Take 0.05 mg by mouth at bedtime. Pt takes 1/2 tab for 0.05mg  dose         . furosemide (LASIX) 40 MG tablet   Oral   Take 40 mg by mouth every morning.          . hydrALAZINE (APRESOLINE) 50 MG tablet   Oral   Take 50 mg by mouth 2 (two) times daily.         Marland Kitchen HYDROcodone-acetaminophen (NORCO) 10-325 MG per tablet   Oral   Take 1 tablet by mouth every 8 (eight) hours as needed. pain         . Ketotifen Fumarate (ALLERGY EYE DROPS OP)   Ophthalmic   Apply 1 drop to eye 2 (two) times daily.         Marland Kitchen levothyroxine (SYNTHROID, LEVOTHROID) 50 MCG tablet   Oral   Take 25 mcg by mouth daily. Pt takes 1/2 tab for dose         .  methylcellulose (CITRUCEL) oral powder   Oral   Take 1 packet by mouth daily.         . nebivolol (BYSTOLIC) 10 MG tablet   Oral   Take 10 mg by mouth every morning.         Marland Kitchen omeprazole (PRILOSEC) 20 MG capsule   Oral   Take 20 mg by mouth daily.         . Potassium Chloride CR (MICRO-K) 8 MEQ CPCR   Oral   Take 8 mEq by mouth daily.         . predniSONE (DELTASONE) 5 MG tablet   Oral   Take 5 mg by mouth every morning.         . sertraline (ZOLOFT) 25 MG tablet   Oral   Take 25 mg by mouth daily.           BP 133/64  Pulse 54  Temp(Src) 99.5 F (37.5 C) (Rectal)  Resp 15  SpO2 96%  Physical Exam  Nursing note and vitals reviewed. Constitutional: She is oriented to person, place, and time. She appears well-developed and well-nourished. No distress.  HENT:  Head: Normocephalic and atraumatic.  Recent shave biopsy of her left cheek without surrounding signs of infection  Eyes: EOM are normal.  Neck: Normal range of motion.  Cardiovascular: Normal rate, regular rhythm and normal heart sounds.   Pulmonary/Chest: Effort normal and breath sounds normal.  Abdominal: Soft. She exhibits no distension. There is no tenderness.  Musculoskeletal: Normal range of motion.   Neurological: She is alert and oriented to person, place, and time.  Skin: Skin is warm and dry.  Psychiatric: She has a normal mood and affect. Judgment normal.    ED Course  Procedures (including critical care time)  Labs Reviewed  CBC WITH DIFFERENTIAL - Abnormal; Notable for the following:    Hemoglobin 11.9 (*)    Monocytes Relative 23 (*)    Monocytes Absolute 2.0 (*)    All other components within normal limits  BASIC METABOLIC PANEL - Abnormal; Notable for the following:    Potassium 2.8 (*)    BUN 29 (*)    Creatinine, Ser 1.43 (*)    GFR calc non Af Amer 31 (*)    GFR calc Af Amer 35 (*)    All other components within normal limits  URINALYSIS, ROUTINE W REFLEX MICROSCOPIC - Abnormal; Notable for the following:    APPearance CLOUDY (*)    All other components within normal limits  CULTURE, BLOOD (ROUTINE X 2)  CULTURE, BLOOD (ROUTINE X 2)  URINE CULTURE  TROPONIN I  LACTIC ACID, PLASMA   Dg Chest 2 View  06/02/2012   *RADIOLOGY REPORT*  Clinical Data: Body aches, chills  CHEST - 2 VIEW  Comparison: 05/20/2006  Findings: Cardiomegaly.  Bilateral small pleural effusion with bilateral basilar atelectasis or infiltrate.  No convincing pulmonary edema.  Large hiatal hernia.  Osteopenia and mild degenerative changes thoracic spine.  IMPRESSION: Cardiomegaly. Bilateral small pleural effusion with bilateral basilar atelectasis or infiltrate.  No pulmonary edema.  Large hiatal hernia.   Original Report Authenticated By: Natasha Mead, M.D.   I personally reviewed the imaging tests through PACS system I reviewed available ER/hospitalization records through the EMR   1. Chills (without fever)   2. Hypokalemia       MDM  Baseline renal insufficiency for the patient.  Mild hypokalemia here.  Patient feels much better in the emergency department.  Blood and  urine cultures pending.  Lactate normal.  Vital signs normal.  Rectal temp 99.8.  Repeat oral temperature at 2:33 PM was  98.8 orally.  At this time her chest x-ray shows atelectasis versus infiltrate.  She did have a coughing episode yesterday and possible choking episode.  This may represent early aspiration pneumonia however her vital signs look good and she is not hypoxic.  No increased work of breathing.  I've asked that the patient return in emergency apartment tomorrow for recheck.  She'll be dosed with Levaquin here and discharged home with a prescription for Levaquin.  Oral potassium and emerged from it.  Home with 5 days of oral potassium supplementation.  She understands return to the ER for worsening symptoms sooner as needed otherwise I will plan on seeing her back in the emergency department tomorrow after 3 PM.  Blood cultures pending.  Overall very well appearing and very functional 79 year old female.  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  Her daughter is very caring and will check in on her tonight at her assisted living facility.  They're both very agreeable to outpatient plan        Lyanne Co, MD 06/02/12 1437

## 2012-06-02 NOTE — ED Notes (Signed)
[id number]<MV> Expected date:<BR> Expected time:<BR> Means of arrival:<BR> Comments:<BR> Chills and body aches

## 2012-06-02 NOTE — ED Notes (Signed)
Per EMS-from [facility] pt c/o generalized body aches and chills that started last night. Denies fever at this time. NAD

## 2012-06-02 NOTE — Progress Notes (Signed)
CSW met with pt at bedside along with pt family. Patient shared she is an independent resident at [facility]. Patient plans to return to independent when medically stable. .No further Clinical Social Work needs, signing off.    Catha Gosselin, LCSWA  [phone] .06/02/2012 1021am

## 2012-06-03 DIAGNOSIS — E876 Hypokalemia: Secondary | ICD-10-CM | POA: Diagnosis not present

## 2012-06-03 DIAGNOSIS — R6883 Chills (without fever): Secondary | ICD-10-CM | POA: Diagnosis not present

## 2012-06-03 LAB — URINE CULTURE

## 2012-06-08 ENCOUNTER — Emergency Department (HOSPITAL_BASED_OUTPATIENT_CLINIC_OR_DEPARTMENT_OTHER): Payer: Medicare Other

## 2012-06-08 ENCOUNTER — Encounter (HOSPITAL_BASED_OUTPATIENT_CLINIC_OR_DEPARTMENT_OTHER): Payer: Self-pay | Admitting: *Deleted

## 2012-06-08 ENCOUNTER — Emergency Department (HOSPITAL_BASED_OUTPATIENT_CLINIC_OR_DEPARTMENT_OTHER)
Admission: EM | Admit: 2012-06-08 | Discharge: 2012-06-08 | Disposition: A | Discharge status: Home / Self Care | Payer: Medicare Other | Attending: Emergency Medicine | Admitting: Emergency Medicine

## 2012-06-08 DIAGNOSIS — E079 Disorder of thyroid, unspecified: Secondary | ICD-10-CM | POA: Insufficient documentation

## 2012-06-08 DIAGNOSIS — IMO0002 Reserved for concepts with insufficient information to code with codable children: Secondary | ICD-10-CM | POA: Diagnosis not present

## 2012-06-08 DIAGNOSIS — M129 Arthropathy, unspecified: Secondary | ICD-10-CM | POA: Diagnosis not present

## 2012-06-08 DIAGNOSIS — S63509A Unspecified sprain of unspecified wrist, initial encounter: Secondary | ICD-10-CM | POA: Diagnosis not present

## 2012-06-08 DIAGNOSIS — I10 Essential (primary) hypertension: Secondary | ICD-10-CM | POA: Diagnosis not present

## 2012-06-08 DIAGNOSIS — Z79899 Other long term (current) drug therapy: Secondary | ICD-10-CM | POA: Diagnosis not present

## 2012-06-08 DIAGNOSIS — Y929 Unspecified place or not applicable: Secondary | ICD-10-CM | POA: Insufficient documentation

## 2012-06-08 DIAGNOSIS — S93409A Sprain of unspecified ligament of unspecified ankle, initial encounter: Secondary | ICD-10-CM | POA: Diagnosis not present

## 2012-06-08 DIAGNOSIS — Y939 Activity, unspecified: Secondary | ICD-10-CM | POA: Insufficient documentation

## 2012-06-08 DIAGNOSIS — Z8739 Personal history of other diseases of the musculoskeletal system and connective tissue: Secondary | ICD-10-CM | POA: Diagnosis not present

## 2012-06-08 DIAGNOSIS — Z8719 Personal history of other diseases of the digestive system: Secondary | ICD-10-CM | POA: Insufficient documentation

## 2012-06-08 DIAGNOSIS — M19039 Primary osteoarthritis, unspecified wrist: Secondary | ICD-10-CM | POA: Diagnosis not present

## 2012-06-08 DIAGNOSIS — X58XXXA Exposure to other specified factors, initial encounter: Secondary | ICD-10-CM | POA: Insufficient documentation

## 2012-06-08 LAB — CULTURE, BLOOD (ROUTINE X 2): Culture: NO GROWTH

## 2012-06-08 MED ORDER — FENTANYL CITRATE 0.05 MG/ML IJ SOLN
50.0000 ug | Freq: Once | INTRAMUSCULAR | Status: AC
  Administered 2012-06-08: 50 ug via NASAL
  Filled 2012-06-08: qty 2

## 2012-06-08 NOTE — ED Notes (Signed)
Left wrist pain. 3 weeks ago she had sudden pain in her wrist when she applied weight to her wrist to help her stand. Today she did the same thing. Swelling to her wrist.

## 2012-06-08 NOTE — ED Provider Notes (Signed)
History    This chart was scribed for Charles B. Bernette Mayers, MD by Leone Payor, ED Scribe. This patient was seen in room MH10/MH10 and the patient's care was started 6:16 PM.   CSN: [id number]  Arrival date & time 06/08/12  1732   First MD Initiated Contact with Patient 06/08/12 1755      Chief Complaint  Patient presents with  . Wrist Injury    The history is provided by the patient. No language interpreter was used.    HPI Comments: Christina Cabrera is a 79 y.o. female with a h/o polymyalgia rheumatica and arthritis who presents to the Emergency Department complaining of constant L wrist pain and swelling that worsened today. Pt states she had similar sudden pain in her wrist 3 weeks ago when she applied weight to her wrist to help her stand. States she did the same thing today while getting up from the sofa. She has taken one 325 mg hydrocodone at 4 pm today with minimal relief. She denies any other pain at this time. She currently lives independently. Pt denies smoking and alcohol use.   Past Medical History  Diagnosis Date  . Hypertension   . Arthritis   . Diverticulitis   . Irritable bowel   . PMR (polymyalgia rheumatica)   . Spinal stenosis   . Thyroid disease     Past Surgical History  Procedure Laterality Date  . Back surgery      No family history on file.  History  Substance Use Topics  . Smoking status: Never Smoker   . Smokeless tobacco: Never Used  . Alcohol Use: No    OB History   Grav Para Term Preterm Abortions TAB SAB Ect Mult Living                  Review of Systems A complete 10 system review of systems was obtained and all systems are negative except as noted in the HPI and PMH.   Allergies  Quinolones  Home Medications   Current Outpatient Rx  Name  Route  Sig  Dispense  Refill  . ALPRAZolam (XANAX) 0.5 MG tablet   Oral   Take 0.25 mg by mouth 3 (three) times daily.          Marland Kitchen amLODipine (NORVASC) 2.5 MG tablet   Oral   Take 2.5  mg by mouth daily.         . Cholecalciferol (VITAMIN D) 2000 UNITS CAPS   Oral   Take 2,000 Units by mouth daily.         . cloNIDine (CATAPRES) 0.1 MG tablet   Oral   Take 0.05 mg by mouth at bedtime. Pt takes 1/2 tab for 0.05mg  dose         . furosemide (LASIX) 40 MG tablet   Oral   Take 40 mg by mouth every morning.          . hydrALAZINE (APRESOLINE) 50 MG tablet   Oral   Take 50 mg by mouth 2 (two) times daily.         Marland Kitchen HYDROcodone-acetaminophen (NORCO) 10-325 MG per tablet   Oral   Take 1 tablet by mouth every 8 (eight) hours as needed. pain         . Ketotifen Fumarate (ALLERGY EYE DROPS OP)   Ophthalmic   Apply 1 drop to eye 2 (two) times daily.         Marland Kitchen levofloxacin (LEVAQUIN) 500 MG tablet  Oral   Take 1 tablet (500 mg total) by mouth daily.   7 tablet   0   . levothyroxine (SYNTHROID, LEVOTHROID) 50 MCG tablet   Oral   Take 25 mcg by mouth daily. Pt takes 1/2 tab for dose         . methylcellulose (CITRUCEL) oral powder   Oral   Take 1 packet by mouth daily.         . nebivolol (BYSTOLIC) 10 MG tablet   Oral   Take 10 mg by mouth every morning.         Marland Kitchen omeprazole (PRILOSEC) 20 MG capsule   Oral   Take 20 mg by mouth daily.         . Potassium Chloride CR (MICRO-K) 8 MEQ CPCR   Oral   Take 8 mEq by mouth daily.         . potassium chloride SA (K-DUR,KLOR-CON) 20 MEQ tablet   Oral   Take 1 tablet (20 mEq total) by mouth daily.   5 tablet   0   . predniSONE (DELTASONE) 5 MG tablet   Oral   Take 5 mg by mouth every morning.         . sertraline (ZOLOFT) 25 MG tablet   Oral   Take 25 mg by mouth daily.           BP 151/69  Pulse 54  Temp(Src) 98 F (36.7 C) (Oral)  Resp 20  Wt 160 lb (72.576 kg)  BMI 27.45 kg/m2  SpO2 96%  Physical Exam  Nursing note and vitals reviewed. Constitutional: She is oriented to person, place, and time. She appears well-developed and well-nourished.  HENT:  Head:  Normocephalic and atraumatic.  Neck: Neck supple.  Pulmonary/Chest: Effort normal.  Musculoskeletal: She exhibits tenderness.  Decreased ROM due to pain. Tender over the L radial wrist where there is moderate swelling but no deformity.     Neurological: She is alert and oriented to person, place, and time. No cranial nerve deficit.  Psychiatric: She has a normal mood and affect. Her behavior is normal.    ED Course  Procedures (including critical care time)  DIAGNOSTIC STUDIES: Oxygen Saturation is 96% on room air, adequate by my interpretation.    COORDINATION OF CARE: 6:28 PM Discussed treatment plan with pt at bedside and pt agreed to plan.   Labs Reviewed - No data to display Dg Wrist Complete Left  06/08/2012   *RADIOLOGY REPORT*  Clinical Data: Left-sided wrist pain  LEFT WRIST - COMPLETE 3+ VIEW  Comparison: None.  Findings: No distal radius fracture.  Radiocarpal joint intact.  No carpal fracture.  Degenerative change at the first carpal metacarpal joint.  Osteopenia.  IMPRESSION: No fracture.  Osteopenia.   Original Report Authenticated By: Genevive Bi, M.D.     1. Wrist sprain and strain, left, initial encounter       MDM  Wrist negative for fracture. Pain from sprain, also some component of her baseline PMR. Placed in splint for comfort. Has pain medications at home. PCP and ortho followup.       I personally performed the services described in this documentation, which was scribed in my presence. The recorded information has been reviewed and is accurate.     Charles B. Bernette Mayers, MD 06/08/12 [id number]

## 2012-06-09 ENCOUNTER — Encounter: Payer: Self-pay | Admitting: Family Medicine

## 2012-06-09 ENCOUNTER — Ambulatory Visit (HOSPITAL_BASED_OUTPATIENT_CLINIC_OR_DEPARTMENT_OTHER): Payer: Medicare Other | Admitting: Family Medicine

## 2012-06-09 VITALS — BP 131/68 | HR 53 | Ht 64.0 in | Wt 160.0 lb

## 2012-06-09 DIAGNOSIS — S6992XA Unspecified injury of left wrist, hand and finger(s), initial encounter: Secondary | ICD-10-CM

## 2012-06-09 DIAGNOSIS — S59919A Unspecified injury of unspecified forearm, initial encounter: Secondary | ICD-10-CM | POA: Diagnosis not present

## 2012-06-09 DIAGNOSIS — M25561 Pain in right knee: Secondary | ICD-10-CM

## 2012-06-09 DIAGNOSIS — S59909A Unspecified injury of unspecified elbow, initial encounter: Secondary | ICD-10-CM | POA: Diagnosis not present

## 2012-06-09 DIAGNOSIS — M25569 Pain in unspecified knee: Secondary | ICD-10-CM | POA: Diagnosis not present

## 2012-06-09 DIAGNOSIS — S6990XA Unspecified injury of unspecified wrist, hand and finger(s), initial encounter: Secondary | ICD-10-CM | POA: Diagnosis not present

## 2012-06-09 NOTE — Patient Instructions (Addendum)
Your knee pain is due to arthritis. Take tylenol 500mg  1-2 tabs three times a day for pain (do NOT take if you're taking hydrocodone though). Glucosamine sulfate 750mg  twice a day is a supplement that may help moderate to severe arthritis. Capsaicin topically up to four times a day may also help with pain. Cortisone injections are an option - you were given these today. If cortisone injections do not help, there are different types of shots that may help but they take longer to take effect. Consider physical therapy to strengthen muscles around the joint that hurts to take pressure off of the joint itself. Heat or ice 15 minutes at a time 3-4 times a day as needed to help with pain.  Wear the wrist brace regularly for the next 2 weeks.  We will look into home health evaluation at [facility] to see how we can get this done and evaluate needs. Follow up with me in 2 weeks for reevaluation.

## 2012-06-10 ENCOUNTER — Encounter: Payer: Self-pay | Admitting: Family Medicine

## 2012-06-10 DIAGNOSIS — S6992XA Unspecified injury of left wrist, hand and finger(s), initial encounter: Secondary | ICD-10-CM | POA: Insufficient documentation

## 2012-06-10 DIAGNOSIS — M25562 Pain in left knee: Secondary | ICD-10-CM | POA: Insufficient documentation

## 2012-06-10 DIAGNOSIS — M25561 Pain in right knee: Secondary | ICD-10-CM

## 2012-06-10 HISTORY — DX: Pain in right knee: M25.561

## 2012-06-10 NOTE — Assessment & Plan Note (Signed)
2/2 DJD.  Deferred radiographs as likely to just confirm diagnosis.  Discussed tylenol, glucosamine, capsaicin.  She would like to try intraarticular cortisone injections.  After informed written consent, patient was seated on exam table. Right knee was prepped with alcohol swab and utilizing anteromedial approach, patient's right knee was injected intraarticularly with 3:1 marcaine: depomedrol. Patient tolerated the procedure well without immediate complications.  After informed written consent, patient was seated on exam table. Left knee was prepped with alcohol swab and utilizing anteromedial approach, patient's left knee was injected intraarticularly with 3:1 marcaine: depomedrol. Patient tolerated the procedure well without immediate complications.

## 2012-06-10 NOTE — Progress Notes (Signed)
Subjective:    Patient ID: Christina Cabrera, female    DOB: 08/29/1917, 79 y.o.   MRN: [id number]  PCP: Dr. Catha Gosselin  HPI 79 yo F here for left wrist injury, knee pain.  Patient here with her daughter. They state about 2-3 weeks ago she went to push up with her left wrist and rolled it (not sure which way). Had pain, some swelling. Seemed to be getting better until yesterday when she did the same thing and caused same pain in wrist more on radial side. Went to ED - had x-rays negative for fracture and placed in a thumb spica. States the main reason she pushes up is because her knees bother her all over anteriorly - difficult getting up without pushing with her wrists. Lives in an independent living facility ([facility]) but currently staying with her daughter [name]. Both knees crunch and crack - has never had treatment or workup for these in past.  Past Medical History  Diagnosis Date  . Hypertension   . Arthritis   . Diverticulitis   . Irritable bowel   . PMR (polymyalgia rheumatica)   . Spinal stenosis   . Thyroid disease     Current Outpatient Prescriptions on File Prior to Visit  Medication Sig Dispense Refill  . ALPRAZolam (XANAX) 0.5 MG tablet Take 0.25 mg by mouth 3 (three) times daily.       Marland Kitchen amLODipine (NORVASC) 2.5 MG tablet Take 2.5 mg by mouth daily.      . Cholecalciferol (VITAMIN D) 2000 UNITS CAPS Take 2,000 Units by mouth daily.      . cloNIDine (CATAPRES) 0.1 MG tablet Take 0.05 mg by mouth at bedtime. Pt takes 1/2 tab for 0.05mg  dose      . furosemide (LASIX) 40 MG tablet Take 40 mg by mouth every morning.       . hydrALAZINE (APRESOLINE) 50 MG tablet Take 50 mg by mouth 2 (two) times daily.      Marland Kitchen HYDROcodone-acetaminophen (NORCO) 10-325 MG per tablet Take 1 tablet by mouth every 8 (eight) hours as needed. pain      . Ketotifen Fumarate (ALLERGY EYE DROPS OP) Apply 1 drop to eye 2 (two) times daily.      Marland Kitchen levofloxacin (LEVAQUIN) 500 MG  tablet Take 1 tablet (500 mg total) by mouth daily.  7 tablet  0  . levothyroxine (SYNTHROID, LEVOTHROID) 50 MCG tablet Take 25 mcg by mouth daily. Pt takes 1/2 tab for dose      . methylcellulose (CITRUCEL) oral powder Take 1 packet by mouth daily.      . nebivolol (BYSTOLIC) 10 MG tablet Take 10 mg by mouth every morning.      Marland Kitchen omeprazole (PRILOSEC) 20 MG capsule Take 20 mg by mouth daily.      . Potassium Chloride CR (MICRO-K) 8 MEQ CPCR Take 8 mEq by mouth daily.      . potassium chloride SA (K-DUR,KLOR-CON) 20 MEQ tablet Take 1 tablet (20 mEq total) by mouth daily.  5 tablet  0  . predniSONE (DELTASONE) 5 MG tablet Take 5 mg by mouth every morning.      . sertraline (ZOLOFT) 25 MG tablet Take 25 mg by mouth daily.       No current facility-administered medications on file prior to visit.    Past Surgical History  Procedure Laterality Date  . Back surgery      Allergies  Allergen Reactions  . Quinolones Other (  See Comments)    platelets    History   Social History  . Marital Status: Widowed    Spouse Name: N/A    Number of Children: N/A  . Years of Education: N/A   Occupational History  . Not on file.   Social History Main Topics  . Smoking status: Never Smoker   . Smokeless tobacco: Never Used  . Alcohol Use: No  . Drug Use: No  . Sexually Active: No   Other Topics Concern  . Not on file   Social History Narrative  . No narrative on file    Family History  Problem Relation Age of Onset  . Hyperlipidemia Mother   . Hypertension Mother   . Hypertension Father   . Hyperlipidemia Father   . Hyperlipidemia Sister   . Hypertension Sister   . Diabetes Neg Hx   . Heart attack Neg Hx   . Sudden death Neg Hx     BP 131/68  Pulse 53  Ht 5\' 4"  (1.626 m)  Wt 160 lb (72.576 kg)  BMI 27.45 kg/m2  Review of Systems See HPI above.    Objective:   Physical Exam Gen: NAD  L wrist: No gross deformity, swelling, bruising. Mild TTP distal radius,  minimal at scaphoid, 2nd, 3rd metacarpals, dorsal wrist. FROM wrist with mild pain all directions. NVI distally.  Bilateral knees: No gross deformity, ecchymoses, swelling.  2+ crepitation bilaterally - worse on right with grinding post patella laterally. TTP medial, lateral joint lines and post patellar facets. ROM 0 - 90 degrees. Negative ant/post drawers. Negative valgus/varus testing. Negative lachmanns. Negative mcmurrays, apleys, patellar apprehension. NV intact distally.    Assessment & Plan:  1. Left wrist injury - 2/2 contusion, sprain.  Initial radiographs negative and only minimal pain at snuffbox/scaphoid.  Doubt a fracture but will treat conservatively with thumb spica brace, tylenol (or hydrocodone), icing as needed.    2. Bilateral knee pain - 2/2 DJD.  Deferred radiographs as likely to just confirm diagnosis.  Discussed tylenol, glucosamine, capsaicin.  She would like to try intraarticular cortisone injections.  After informed written consent, patient was seated on exam table. Right knee was prepped with alcohol swab and utilizing anteromedial approach, patient's right knee was injected intraarticularly with 3:1 marcaine: depomedrol. Patient tolerated the procedure well without immediate complications.  After informed written consent, patient was seated on exam table. Left knee was prepped with alcohol swab and utilizing anteromedial approach, patient's left knee was injected intraarticularly with 3:1 marcaine: depomedrol. Patient tolerated the procedure well without immediate complications.

## 2012-06-10 NOTE — Assessment & Plan Note (Signed)
2/2 contusion, sprain.  Initial radiographs negative and only minimal pain at snuffbox/scaphoid.  Doubt a fracture but will treat conservatively with thumb spica brace, tylenol (or hydrocodone), icing as needed.

## 2012-06-15 DIAGNOSIS — M6281 Muscle weakness (generalized): Secondary | ICD-10-CM | POA: Diagnosis not present

## 2012-06-15 DIAGNOSIS — S63509A Unspecified sprain of unspecified wrist, initial encounter: Secondary | ICD-10-CM | POA: Diagnosis not present

## 2012-06-18 DIAGNOSIS — M6281 Muscle weakness (generalized): Secondary | ICD-10-CM | POA: Diagnosis not present

## 2012-06-18 DIAGNOSIS — S63509A Unspecified sprain of unspecified wrist, initial encounter: Secondary | ICD-10-CM | POA: Diagnosis not present

## 2012-06-22 DIAGNOSIS — Z79899 Other long term (current) drug therapy: Secondary | ICD-10-CM | POA: Diagnosis not present

## 2012-06-24 ENCOUNTER — Encounter: Payer: Self-pay | Admitting: Family Medicine

## 2012-06-24 ENCOUNTER — Ambulatory Visit (INDEPENDENT_AMBULATORY_CARE_PROVIDER_SITE_OTHER): Payer: Medicare Other | Admitting: Family Medicine

## 2012-06-24 VITALS — BP 151/64 | HR 48 | Ht 64.0 in | Wt 160.0 lb

## 2012-06-24 DIAGNOSIS — Z5189 Encounter for other specified aftercare: Secondary | ICD-10-CM | POA: Diagnosis not present

## 2012-06-24 DIAGNOSIS — M25569 Pain in unspecified knee: Secondary | ICD-10-CM

## 2012-06-24 DIAGNOSIS — M6281 Muscle weakness (generalized): Secondary | ICD-10-CM | POA: Diagnosis not present

## 2012-06-24 DIAGNOSIS — S63509A Unspecified sprain of unspecified wrist, initial encounter: Secondary | ICD-10-CM | POA: Diagnosis not present

## 2012-06-24 DIAGNOSIS — M25561 Pain in right knee: Secondary | ICD-10-CM

## 2012-06-24 DIAGNOSIS — S59909A Unspecified injury of unspecified elbow, initial encounter: Secondary | ICD-10-CM | POA: Diagnosis not present

## 2012-06-24 DIAGNOSIS — S6992XD Unspecified injury of left wrist, hand and finger(s), subsequent encounter: Secondary | ICD-10-CM

## 2012-06-25 ENCOUNTER — Encounter: Payer: Self-pay | Admitting: Family Medicine

## 2012-06-25 NOTE — Patient Instructions (Addendum)
Advised to follow up in about 2-3 months if knees bothering her and wants repeat injections.

## 2012-06-25 NOTE — Assessment & Plan Note (Signed)
2/2 DJD.  Improved with injections.  Again discussed tylenol, glucosamine, capsaicin.  Can repeat these in about 2 months - discussed viscosupplementation as an option as well.

## 2012-06-25 NOTE — Assessment & Plan Note (Signed)
2/2 contusion, sprain.  No longer with tenderness at snuffbox/scaphoid.  Reassured.  Advised to use brace essentially when she uses wrist/hands to push up out of a chair to minimize risk of reinjury.  Otherwise tylenol, icing as needed.

## 2012-06-25 NOTE — Progress Notes (Signed)
Subjective:    Patient ID: Christina Cabrera, female    DOB: 30-Jun-1917, 79 y.o.   MRN: [id number]  PCP: Dr. Catha Gosselin  Knee Pain    79 yo F here for f/u left wrist injury, knee pain.  5/27: Patient here with her daughter. They state about 2-3 weeks ago she went to push up with her left wrist and rolled it (not sure which way). Had pain, some swelling. Seemed to be getting better until yesterday when she did the same thing and caused same pain in wrist more on radial side. Went to ED - had x-rays negative for fracture and placed in a thumb spica. States the main reason she pushes up is because her knees bother her all over anteriorly - difficult getting up without pushing with her wrists. Lives in an independent living facility ([facility]) but currently staying with her daughter [name]. Both knees crunch and crack - has never had treatment or workup for these in past.  6/11: Patient returns stating wrist, knees all improved since last visit. Still gets some pain palmar aspect of left wrist with extension that goes up forearm.  No numbness/tingling. Wearing wrist brace regularly. Knees have had mild improvement since injections - can tell a difference.  Past Medical History  Diagnosis Date  . Hypertension   . Arthritis   . Diverticulitis   . Irritable bowel   . PMR (polymyalgia rheumatica)   . Spinal stenosis   . Thyroid disease     Current Outpatient Prescriptions on File Prior to Visit  Medication Sig Dispense Refill  . ALPRAZolam (XANAX) 0.5 MG tablet Take 0.25 mg by mouth 3 (three) times daily.       Marland Kitchen amLODipine (NORVASC) 2.5 MG tablet Take 2.5 mg by mouth daily.      . Cholecalciferol (VITAMIN D) 2000 UNITS CAPS Take 2,000 Units by mouth daily.      . cloNIDine (CATAPRES) 0.1 MG tablet Take 0.05 mg by mouth at bedtime. Pt takes 1/2 tab for 0.05mg  dose      . furosemide (LASIX) 40 MG tablet Take 40 mg by mouth every morning.       . hydrALAZINE  (APRESOLINE) 50 MG tablet Take 50 mg by mouth 2 (two) times daily.      Marland Kitchen HYDROcodone-acetaminophen (NORCO) 10-325 MG per tablet Take 1 tablet by mouth every 8 (eight) hours as needed. pain      . Ketotifen Fumarate (ALLERGY EYE DROPS OP) Apply 1 drop to eye 2 (two) times daily.      Marland Kitchen levofloxacin (LEVAQUIN) 500 MG tablet Take 1 tablet (500 mg total) by mouth daily.  7 tablet  0  . levothyroxine (SYNTHROID, LEVOTHROID) 50 MCG tablet Take 25 mcg by mouth daily. Pt takes 1/2 tab for dose      . methylcellulose (CITRUCEL) oral powder Take 1 packet by mouth daily.      . nebivolol (BYSTOLIC) 10 MG tablet Take 10 mg by mouth every morning.      Marland Kitchen omeprazole (PRILOSEC) 20 MG capsule Take 20 mg by mouth daily.      . Potassium Chloride CR (MICRO-K) 8 MEQ CPCR Take 8 mEq by mouth daily.      . potassium chloride SA (K-DUR,KLOR-CON) 20 MEQ tablet Take 1 tablet (20 mEq total) by mouth daily.  5 tablet  0  . predniSONE (DELTASONE) 5 MG tablet Take 5 mg by mouth every morning.      . sertraline (  ZOLOFT) 25 MG tablet Take 25 mg by mouth daily.       No current facility-administered medications on file prior to visit.    Past Surgical History  Procedure Laterality Date  . Back surgery      Allergies  Allergen Reactions  . Quinolones Other (See Comments)    platelets    History   Social History  . Marital Status: Widowed    Spouse Name: N/A    Number of Children: N/A  . Years of Education: N/A   Occupational History  . Not on file.   Social History Main Topics  . Smoking status: Never Smoker   . Smokeless tobacco: Never Used  . Alcohol Use: No  . Drug Use: No  . Sexually Active: No   Other Topics Concern  . Not on file   Social History Narrative  . No narrative on file    Family History  Problem Relation Age of Onset  . Hyperlipidemia Mother   . Hypertension Mother   . Hypertension Father   . Hyperlipidemia Father   . Hyperlipidemia Sister   . Hypertension Sister    . Diabetes Neg Hx   . Heart attack Neg Hx   . Sudden death Neg Hx     BP 151/64  Pulse 48  Ht 5\' 4"  (1.626 m)  Wt 160 lb (72.576 kg)  BMI 27.45 kg/m2  Review of Systems  See HPI above.    Objective:   Physical Exam  Gen: NAD  L wrist: No gross deformity, swelling, bruising. Minimal TTP volar aspect of hand but not over bony prominences.  No longer with TTP distal radius, scaphoid, 2nd, 3rd metacarpals, dorsal wrist. FROM. NVI distally.  Bilateral knees: No gross deformity, ecchymoses, swelling.  2+ crepitation bilaterally - worse on right with grinding post patella laterally. No TTP medial, lateral joint lines and post patellar facets. ROM 0 - 90 degrees. Negative ant/post drawers. Negative valgus/varus testing. Negative lachmanns.    Assessment & Plan:  1. Left wrist injury - 2/2 contusion, sprain.  No longer with tenderness at snuffbox/scaphoid.  Reassured.  Advised to use brace essentially when she uses wrist/hands to push up out of a chair to minimize risk of reinjury.  Otherwise tylenol, icing as needed.  2. Bilateral knee pain - 2/2 DJD.  Improved with injections.  Again discussed tylenol, glucosamine, capsaicin.  Can repeat these in about 2 months - discussed viscosupplementation as an option as well.

## 2012-07-02 DIAGNOSIS — H3530 Unspecified macular degeneration: Secondary | ICD-10-CM | POA: Diagnosis not present

## 2012-07-02 DIAGNOSIS — H04129 Dry eye syndrome of unspecified lacrimal gland: Secondary | ICD-10-CM | POA: Diagnosis not present

## 2012-07-02 DIAGNOSIS — Z961 Presence of intraocular lens: Secondary | ICD-10-CM | POA: Diagnosis not present

## 2012-07-02 DIAGNOSIS — H43819 Vitreous degeneration, unspecified eye: Secondary | ICD-10-CM | POA: Diagnosis not present

## 2012-08-10 DIAGNOSIS — R1310 Dysphagia, unspecified: Secondary | ICD-10-CM | POA: Diagnosis not present

## 2012-08-10 DIAGNOSIS — R1084 Generalized abdominal pain: Secondary | ICD-10-CM | POA: Diagnosis not present

## 2012-08-10 DIAGNOSIS — K219 Gastro-esophageal reflux disease without esophagitis: Secondary | ICD-10-CM | POA: Diagnosis not present

## 2012-08-10 DIAGNOSIS — K5900 Constipation, unspecified: Secondary | ICD-10-CM | POA: Diagnosis not present

## 2012-08-24 ENCOUNTER — Ambulatory Visit: Payer: Medicare Other

## 2012-08-24 ENCOUNTER — Ambulatory Visit (INDEPENDENT_AMBULATORY_CARE_PROVIDER_SITE_OTHER): Payer: Medicare Other | Admitting: Family Medicine

## 2012-08-24 VITALS — BP 160/90 | HR 88 | Temp 98.1°F | Resp 18

## 2012-08-24 DIAGNOSIS — R071 Chest pain on breathing: Secondary | ICD-10-CM | POA: Diagnosis not present

## 2012-08-24 DIAGNOSIS — R0789 Other chest pain: Secondary | ICD-10-CM

## 2012-08-24 NOTE — Progress Notes (Signed)
  Subjective:    Patient ID: Christina Cabrera, female    DOB: [birth year], 79 y.o.   MRN: [id number]  HPI 79 y.o female presents to clinic today after a fall last night in her bath tub when she stepped on a bar of soap. Has some rib pain on the right side and is feeling short of breath.   Patient also hit head when she slipped . Took hydrocodone she had which was prescribed for chronic back pain  Pateint has colitis and had some diarrhea this morning   Review of Systems No LOC or dyspnea    Objective:   Physical Exam Elderly woman in obvious pain, especially when she takes deep breath Head exam: No lacerations or swelling Neurological: Intact mental status cranial nerves and motor Chest: Right-sided crepitus with deep breath, good breath sounds bilaterally, tender with chest compression or palpation of posterior right ribs. Skin: no ecchymosis Extremities: 1+ edema [facility] reading (PRIMARY) by  Dr. Milus Glazier rib films. Suspicious for rib #9, bilateral effusions (small)     Assessment & Plan:  Chest wall contusion with possible fx  Plan:  Continue the norco 10-325 q4h prn Recheck Wed or Thursday for repeat film  Signed, Mosie Epstein, MD

## 2012-08-24 NOTE — Patient Instructions (Addendum)
Follow up Wednesday afternoon (1-5) or Thursday at [address] at 12:30 to 1:00 pm

## 2012-08-26 ENCOUNTER — Ambulatory Visit: Payer: Medicare Other

## 2012-08-26 ENCOUNTER — Ambulatory Visit (INDEPENDENT_AMBULATORY_CARE_PROVIDER_SITE_OTHER): Payer: Medicare Other | Admitting: Family Medicine

## 2012-08-26 VITALS — BP 140/68 | HR 88 | Temp 98.0°F | Resp 16

## 2012-08-26 DIAGNOSIS — R0789 Other chest pain: Secondary | ICD-10-CM

## 2012-08-26 DIAGNOSIS — R071 Chest pain on breathing: Secondary | ICD-10-CM | POA: Diagnosis not present

## 2012-08-26 NOTE — Progress Notes (Signed)
  Subjective:    Patient ID: Christina Cabrera, female    DOB: 22-Nov-1917, 79 y.o.   MRN: [id number]  HPI  79 YO female patient here today to follow up on right side rib pain and diarrhea. Pt reports she is still in pain. The pain increases with movement. Pt reports the diarrhea has gotten better but has not resolved.    Review of Systems     Objective:   Physical Exam Chest:  Bilateral rales, worse on right base, good BS bilaterally, very tender left posterior ribs Heart:  Reg, I/VI systolic murmur  [facility] reading (PRIMARY) by  Dr. Johny Chess appears unchanged.       Assessment & Plan:  No significant change for the worse  Plan:  Continue current pain meds Follow up 1 week  Signed, Elvina Sidle, MD

## 2012-09-03 DIAGNOSIS — E876 Hypokalemia: Secondary | ICD-10-CM | POA: Diagnosis not present

## 2012-10-21 DIAGNOSIS — M1710 Unilateral primary osteoarthritis, unspecified knee: Secondary | ICD-10-CM | POA: Diagnosis not present

## 2012-10-21 DIAGNOSIS — M25569 Pain in unspecified knee: Secondary | ICD-10-CM | POA: Diagnosis not present

## 2012-10-26 DIAGNOSIS — N390 Urinary tract infection, site not specified: Secondary | ICD-10-CM | POA: Diagnosis not present

## 2012-10-26 DIAGNOSIS — R300 Dysuria: Secondary | ICD-10-CM | POA: Diagnosis not present

## 2012-10-28 DIAGNOSIS — Z23 Encounter for immunization: Secondary | ICD-10-CM | POA: Diagnosis not present

## 2012-12-24 DIAGNOSIS — K219 Gastro-esophageal reflux disease without esophagitis: Secondary | ICD-10-CM | POA: Diagnosis not present

## 2012-12-24 DIAGNOSIS — M353 Polymyalgia rheumatica: Secondary | ICD-10-CM | POA: Diagnosis not present

## 2012-12-24 DIAGNOSIS — E559 Vitamin D deficiency, unspecified: Secondary | ICD-10-CM | POA: Diagnosis not present

## 2012-12-24 DIAGNOSIS — N184 Chronic kidney disease, stage 4 (severe): Secondary | ICD-10-CM | POA: Diagnosis not present

## 2012-12-24 DIAGNOSIS — E039 Hypothyroidism, unspecified: Secondary | ICD-10-CM | POA: Diagnosis not present

## 2012-12-24 DIAGNOSIS — K559 Vascular disorder of intestine, unspecified: Secondary | ICD-10-CM | POA: Diagnosis not present

## 2012-12-24 DIAGNOSIS — Z0000 Encounter for general adult medical examination without abnormal findings: Secondary | ICD-10-CM | POA: Diagnosis not present

## 2012-12-24 DIAGNOSIS — I10 Essential (primary) hypertension: Secondary | ICD-10-CM | POA: Diagnosis not present

## 2012-12-24 DIAGNOSIS — F411 Generalized anxiety disorder: Secondary | ICD-10-CM | POA: Diagnosis not present

## 2013-02-18 DIAGNOSIS — E039 Hypothyroidism, unspecified: Secondary | ICD-10-CM | POA: Diagnosis not present

## 2013-03-29 DIAGNOSIS — M1710 Unilateral primary osteoarthritis, unspecified knee: Secondary | ICD-10-CM | POA: Diagnosis not present

## 2013-04-02 ENCOUNTER — Inpatient Hospital Stay (HOSPITAL_COMMUNITY)
Admission: EM | Admit: 2013-04-02 | Discharge: 2013-04-04 | DRG: 309 | Disposition: A | Discharge status: Home / Self Care | Payer: Medicare Other | Attending: Cardiology | Admitting: Cardiology

## 2013-04-02 ENCOUNTER — Emergency Department (HOSPITAL_COMMUNITY): Payer: Medicare Other

## 2013-04-02 ENCOUNTER — Encounter (HOSPITAL_COMMUNITY): Payer: Self-pay | Admitting: Emergency Medicine

## 2013-04-02 DIAGNOSIS — F411 Generalized anxiety disorder: Secondary | ICD-10-CM | POA: Diagnosis present

## 2013-04-02 DIAGNOSIS — I359 Nonrheumatic aortic valve disorder, unspecified: Secondary | ICD-10-CM | POA: Diagnosis present

## 2013-04-02 DIAGNOSIS — I498 Other specified cardiac arrhythmias: Principal | ICD-10-CM | POA: Diagnosis present

## 2013-04-02 DIAGNOSIS — E785 Hyperlipidemia, unspecified: Secondary | ICD-10-CM | POA: Diagnosis present

## 2013-04-02 DIAGNOSIS — I350 Nonrheumatic aortic (valve) stenosis: Secondary | ICD-10-CM | POA: Diagnosis present

## 2013-04-02 DIAGNOSIS — R002 Palpitations: Secondary | ICD-10-CM | POA: Diagnosis not present

## 2013-04-02 DIAGNOSIS — Z8249 Family history of ischemic heart disease and other diseases of the circulatory system: Secondary | ICD-10-CM

## 2013-04-02 DIAGNOSIS — I10 Essential (primary) hypertension: Secondary | ICD-10-CM

## 2013-04-02 DIAGNOSIS — M129 Arthropathy, unspecified: Secondary | ICD-10-CM | POA: Diagnosis present

## 2013-04-02 DIAGNOSIS — M353 Polymyalgia rheumatica: Secondary | ICD-10-CM | POA: Diagnosis present

## 2013-04-02 DIAGNOSIS — K5900 Constipation, unspecified: Secondary | ICD-10-CM | POA: Diagnosis not present

## 2013-04-02 DIAGNOSIS — Z79899 Other long term (current) drug therapy: Secondary | ICD-10-CM | POA: Diagnosis not present

## 2013-04-02 DIAGNOSIS — IMO0002 Reserved for concepts with insufficient information to code with codable children: Secondary | ICD-10-CM

## 2013-04-02 DIAGNOSIS — T448X5A Adverse effect of centrally-acting and adrenergic-neuron-blocking agents, initial encounter: Secondary | ICD-10-CM | POA: Diagnosis present

## 2013-04-02 DIAGNOSIS — N189 Chronic kidney disease, unspecified: Secondary | ICD-10-CM

## 2013-04-02 DIAGNOSIS — E876 Hypokalemia: Secondary | ICD-10-CM | POA: Diagnosis not present

## 2013-04-02 DIAGNOSIS — E039 Hypothyroidism, unspecified: Secondary | ICD-10-CM | POA: Diagnosis not present

## 2013-04-02 DIAGNOSIS — M810 Age-related osteoporosis without current pathological fracture: Secondary | ICD-10-CM | POA: Diagnosis present

## 2013-04-02 DIAGNOSIS — R0602 Shortness of breath: Secondary | ICD-10-CM | POA: Diagnosis not present

## 2013-04-02 DIAGNOSIS — I129 Hypertensive chronic kidney disease with stage 1 through stage 4 chronic kidney disease, or unspecified chronic kidney disease: Secondary | ICD-10-CM | POA: Diagnosis present

## 2013-04-02 DIAGNOSIS — R001 Bradycardia, unspecified: Secondary | ICD-10-CM

## 2013-04-02 DIAGNOSIS — I491 Atrial premature depolarization: Secondary | ICD-10-CM | POA: Diagnosis present

## 2013-04-02 DIAGNOSIS — N184 Chronic kidney disease, stage 4 (severe): Secondary | ICD-10-CM | POA: Diagnosis not present

## 2013-04-02 DIAGNOSIS — R079 Chest pain, unspecified: Secondary | ICD-10-CM | POA: Diagnosis not present

## 2013-04-02 DIAGNOSIS — I495 Sick sinus syndrome: Secondary | ICD-10-CM | POA: Diagnosis not present

## 2013-04-02 DIAGNOSIS — T465X5A Adverse effect of other antihypertensive drugs, initial encounter: Secondary | ICD-10-CM | POA: Diagnosis present

## 2013-04-02 HISTORY — DX: Bradycardia, unspecified: R00.1

## 2013-04-02 HISTORY — DX: Hypothyroidism, unspecified: E03.9

## 2013-04-02 HISTORY — DX: Hypokalemia: E87.6

## 2013-04-02 HISTORY — DX: Atrial premature depolarization: I49.1

## 2013-04-02 HISTORY — DX: Nonrheumatic aortic (valve) stenosis: I35.0

## 2013-04-02 LAB — TROPONIN I
Troponin I: <0.3 ng/mL (ref <0.30)
Troponin I: <0.3 ng/mL (ref <0.30)

## 2013-04-02 LAB — COMPREHENSIVE METABOLIC PANEL
ALBUMIN: 4.5 g/dL (ref 3.5–5.2)
ALT: 15 U/L (ref 0–35)
AST: 24 U/L (ref 0–37)
Alkaline Phosphatase: 58 U/L (ref 39–117)
BILIRUBIN TOTAL: 0.7 mg/dL (ref 0.3–1.2)
BUN: 49 mg/dL — AB (ref 6–23)
CHLORIDE: 98 meq/L (ref 96–112)
CO2: 24 mEq/L (ref 19–32)
CREATININE: 1.64 mg/dL — AB (ref 0.50–1.10)
Calcium: 9.5 mg/dL (ref 8.4–10.5)
GFR calc Af Amer: 30 mL/min — ABNORMAL LOW (ref >90)
GFR, EST NON AFRICAN AMERICAN: 26 mL/min — AB (ref >90)
Glucose, Bld: 100 mg/dL — ABNORMAL HIGH (ref 70–99)
Potassium: 3.9 mEq/L (ref 3.7–5.3)
Sodium: 141 mEq/L (ref 137–147)
Total Protein: 7.9 g/dL (ref 6.0–8.3)

## 2013-04-02 LAB — CBC WITH DIFFERENTIAL/PLATELET
BASOS PCT: 0 % (ref 0–1)
Basophils Absolute: 0 10*3/uL (ref 0.0–0.1)
EOS PCT: 0 % (ref 0–5)
Eosinophils Absolute: 0 10*3/uL (ref 0.0–0.7)
HEMATOCRIT: 43.5 % (ref 36.0–46.0)
HEMOGLOBIN: 14.5 g/dL (ref 12.0–15.0)
Lymphocytes Relative: 15 % (ref 12–46)
Lymphs Abs: 1.5 10*3/uL (ref 0.7–4.0)
MCH: 28.9 pg (ref 26.0–34.0)
MCHC: 33.3 g/dL (ref 30.0–36.0)
MCV: 86.7 fL (ref 78.0–100.0)
MONO ABS: 1.6 10*3/uL — AB (ref 0.1–1.0)
MONOS PCT: 16 % — AB (ref 3–12)
NEUTROS ABS: 7 10*3/uL (ref 1.7–7.7)
Neutrophils Relative %: 70 % (ref 43–77)
Platelets: 194 10*3/uL (ref 150–400)
RBC: 5.02 MIL/uL (ref 3.87–5.11)
RDW: 14.1 % (ref 11.5–15.5)
WBC: 10 10*3/uL (ref 4.0–10.5)

## 2013-04-02 LAB — MRSA PCR SCREENING: MRSA BY PCR: NEGATIVE

## 2013-04-02 LAB — PROTIME-INR
INR: 1.03 (ref 0.00–1.49)
PROTHROMBIN TIME: 13.3 s (ref 11.6–15.2)

## 2013-04-02 LAB — I-STAT TROPONIN, ED: TROPONIN I, POC: 0.02 ng/mL (ref 0.00–0.08)

## 2013-04-02 MED ORDER — VITAMIN D 1000 UNITS PO TABS
2000.0000 [IU] | ORAL_TABLET | Freq: Every day | ORAL | Status: DC
  Administered 2013-04-03 – 2013-04-04 (×2): 2000 [IU] via ORAL
  Filled 2013-04-02 (×2): qty 2

## 2013-04-02 MED ORDER — PREDNISONE 5 MG PO TABS
5.0000 mg | ORAL_TABLET | Freq: Every day | ORAL | Status: DC
  Administered 2013-04-03 – 2013-04-04 (×2): 5 mg via ORAL
  Filled 2013-04-02 (×3): qty 1

## 2013-04-02 MED ORDER — ALPRAZOLAM 0.25 MG PO TABS
0.2500 mg | ORAL_TABLET | Freq: Two times a day (BID) | ORAL | Status: DC | PRN
  Filled 2013-04-02: qty 1

## 2013-04-02 MED ORDER — FUROSEMIDE 40 MG PO TABS
40.0000 mg | ORAL_TABLET | Freq: Every morning | ORAL | Status: DC
  Administered 2013-04-03 – 2013-04-04 (×2): 40 mg via ORAL
  Filled 2013-04-02 (×2): qty 1

## 2013-04-02 MED ORDER — SERTRALINE HCL 25 MG PO TABS
25.0000 mg | ORAL_TABLET | Freq: Every day | ORAL | Status: DC
  Administered 2013-04-02 – 2013-04-03 (×2): 25 mg via ORAL
  Filled 2013-04-02 (×3): qty 1

## 2013-04-02 MED ORDER — POTASSIUM CHLORIDE CRYS ER 20 MEQ PO TBCR
20.0000 meq | EXTENDED_RELEASE_TABLET | Freq: Every day | ORAL | Status: DC
  Administered 2013-04-03 – 2013-04-04 (×2): 20 meq via ORAL
  Filled 2013-04-02 (×3): qty 1

## 2013-04-02 MED ORDER — ZOLPIDEM TARTRATE 5 MG PO TABS
5.0000 mg | ORAL_TABLET | Freq: Every evening | ORAL | Status: DC | PRN
  Administered 2013-04-02: 5 mg via ORAL
  Filled 2013-04-02: qty 1

## 2013-04-02 MED ORDER — ONDANSETRON HCL 4 MG/2ML IJ SOLN: 4.0000 mg | Freq: Four times a day (QID) | INTRAMUSCULAR | Status: DC | PRN

## 2013-04-02 MED ORDER — NITROGLYCERIN 0.4 MG SL SUBL: 0.4000 mg | SUBLINGUAL_TABLET | SUBLINGUAL | Status: DC | PRN

## 2013-04-02 MED ORDER — PSYLLIUM 95 % PO PACK
1.0000 | PACK | Freq: Every day | ORAL | Status: DC
  Administered 2013-04-02 – 2013-04-03 (×2): 1 via ORAL
  Filled 2013-04-02 (×4): qty 1

## 2013-04-02 MED ORDER — KETOTIFEN FUMARATE 0.025 % OP SOLN
1.0000 [drp] | Freq: Two times a day (BID) | OPHTHALMIC | Status: DC
  Administered 2013-04-03 – 2013-04-04 (×3): 1 [drp] via OPHTHALMIC
  Filled 2013-04-02 (×2): qty 5

## 2013-04-02 MED ORDER — SODIUM CHLORIDE 0.9 % IV SOLN: INTRAVENOUS | Status: DC

## 2013-04-02 MED ORDER — METHYLCELLULOSE (LAXATIVE) PO POWD: 1.0000 | Freq: Every day | ORAL | Status: DC

## 2013-04-02 MED ORDER — HYDROCODONE-ACETAMINOPHEN 10-325 MG PO TABS
1.0000 | ORAL_TABLET | Freq: Four times a day (QID) | ORAL | Status: DC | PRN
  Administered 2013-04-02 – 2013-04-04 (×6): 1 via ORAL
  Filled 2013-04-02 (×7): qty 1

## 2013-04-02 MED ORDER — ALPRAZOLAM 0.25 MG PO TABS
0.2500 mg | ORAL_TABLET | Freq: Three times a day (TID) | ORAL | Status: DC
  Administered 2013-04-02 – 2013-04-04 (×5): 0.25 mg via ORAL
  Filled 2013-04-02 (×5): qty 1

## 2013-04-02 MED ORDER — PANTOPRAZOLE SODIUM 40 MG PO TBEC
40.0000 mg | DELAYED_RELEASE_TABLET | Freq: Every day | ORAL | Status: DC
  Administered 2013-04-03 – 2013-04-04 (×2): 40 mg via ORAL
  Filled 2013-04-02 (×3): qty 1

## 2013-04-02 MED ORDER — LEVOTHYROXINE SODIUM 25 MCG PO TABS
25.0000 ug | ORAL_TABLET | Freq: Every day | ORAL | Status: DC
  Administered 2013-04-03 – 2013-04-04 (×2): 25 ug via ORAL
  Filled 2013-04-02 (×4): qty 1

## 2013-04-02 MED ORDER — ENOXAPARIN SODIUM 40 MG/0.4ML ~~LOC~~ SOLN: 40.0000 mg | SUBCUTANEOUS | Status: DC

## 2013-04-02 MED ORDER — HYDRALAZINE HCL 50 MG PO TABS
50.0000 mg | ORAL_TABLET | Freq: Two times a day (BID) | ORAL | Status: DC
  Administered 2013-04-02 – 2013-04-03 (×2): 50 mg via ORAL
  Filled 2013-04-02 (×4): qty 1

## 2013-04-02 MED ORDER — ENOXAPARIN SODIUM 30 MG/0.3ML ~~LOC~~ SOLN
30.0000 mg | SUBCUTANEOUS | Status: DC
  Administered 2013-04-02 – 2013-04-03 (×2): 30 mg via SUBCUTANEOUS
  Filled 2013-04-02 (×4): qty 0.3

## 2013-04-02 MED ORDER — ACETAMINOPHEN 325 MG PO TABS: 650.0000 mg | ORAL_TABLET | ORAL | Status: DC | PRN

## 2013-04-02 NOTE — ED Notes (Signed)
Cardiologist at bedside talking to pt's daughter

## 2013-04-02 NOTE — ED Notes (Addendum)
Pt from [facility], sent to ED due to HR b/w 30-40s. Pt denies pain just states she feels funny inside. Pt states she feels her heart is skipping beats

## 2013-04-02 NOTE — Progress Notes (Signed)
Pt arrived from [facility] ED via [facility]link on stretcher. Pt awake, alert, oriented and in no apparent discomfort or distress. Pt denies discomfort or distress. Pt moved over to hospital bed, placed on monitor, CHG bath completed, nares swabbed per protocol. Vitals obtained. Admission orders in computer from Dr. Ellyn Hack already. E Link and [facility] notified of patient's arrival. Bed low, brakes locked, side rails up x2, call light in reach. Pt oriented to bed and the room. Will continue to monitor.  - Soyla Dryer, RN

## 2013-04-02 NOTE — Progress Notes (Signed)
CSW met with patient and daughter at bedside.  Patient appears alert, calm, oriented x3, and cooperative.  Patient reports she will return to [facility] once medically stable.      Chesley Noon, MSW, [place], 04/02/2013 Evening Clinical Social Worker [phone]

## 2013-04-02 NOTE — ED Provider Notes (Signed)
CSN: [id number]     Arrival date & time 04/02/13  1304 History   First MD Initiated Contact with Patient 04/02/13 1318     Chief Complaint  Patient presents with  . Bradycardia     (Consider location/radiation/quality/duration/timing/severity/associated sxs/prior Treatment) The history is provided by the patient.  Christina Cabrera is a 80 y.o. female hx of HTN, diverticulitis, CKD here with bradycardia. Noticed low heart rate at home. She felt that her heart was missing some beats. Denies chest pain or shortness of breath. Was noted to be bradycardic to 30s at her facility. Doesn't have pacemaker.    Past Medical History  Diagnosis Date  . Hypertension   . Arthritis   . Diverticulitis   . Irritable bowel   . PMR (polymyalgia rheumatica)   . Spinal stenosis   . Thyroid disease   . Chronic kidney disease     Stage III/IV  . Hyperlipidemia   . Cataract   . Anxiety   . Osteoporosis    Past Surgical History  Procedure Laterality Date  . Back surgery    . Appendectomy    . Cholecystectomy    . Eye surgery    . Abdominal hysterectomy     Family History  Problem Relation Age of Onset  . Hyperlipidemia Mother   . Hypertension Mother   . Hypertension Father   . Hyperlipidemia Father   . Hyperlipidemia Sister   . Hypertension Sister   . Diabetes Neg Hx   . Heart attack Neg Hx   . Sudden death Neg Hx    History  Substance Use Topics  . Smoking status: Never Smoker   . Smokeless tobacco: Never Used  . Alcohol Use: No   OB History   Grav Para Term Preterm Abortions TAB SAB Ect Mult Living                 Review of Systems  Cardiovascular:       Bradycardia   All other systems reviewed and are negative.      Allergies  Quinolones  Home Medications   Current Outpatient Rx  Name  Route  Sig  Dispense  Refill  . ALPRAZolam (XANAX) 0.5 MG tablet   Oral   Take 0.25 mg by mouth 3 (three) times daily.          Marland Kitchen amLODipine (NORVASC) 2.5 MG tablet   Oral    Take 2.5 mg by mouth daily.         . Cholecalciferol (VITAMIN D) 2000 UNITS CAPS   Oral   Take 2,000 Units by mouth daily.         . cloNIDine (CATAPRES) 0.1 MG tablet   Oral   Take 0.05 mg by mouth at bedtime. Pt takes 1/2 tab for 0.05mg  dose         . furosemide (LASIX) 40 MG tablet   Oral   Take 40 mg by mouth every morning.          . hydrALAZINE (APRESOLINE) 50 MG tablet   Oral   Take 50 mg by mouth 2 (two) times daily.         Marland Kitchen HYDROcodone-acetaminophen (NORCO) 10-325 MG per tablet   Oral   Take 1 tablet by mouth every 6 (six) hours as needed. pain         . Ketotifen Fumarate (ALLERGY EYE DROPS OP)   Ophthalmic   Apply 1 drop to eye 2 (two) times daily.         Marland Kitchen  levothyroxine (SYNTHROID, LEVOTHROID) 50 MCG tablet   Oral   Take 25 mcg by mouth daily. Pt takes 1/2 tab for 78mcg dose         . methylcellulose (CITRUCEL) oral powder   Oral   Take 1 packet by mouth daily.         . nebivolol (BYSTOLIC) 10 MG tablet   Oral   Take 10 mg by mouth every morning.         Marland Kitchen omeprazole (PRILOSEC) 20 MG capsule   Oral   Take 20 mg by mouth daily.         . Potassium Chloride CR (MICRO-K) 8 MEQ CPCR   Oral   Take 16 mEq by mouth daily.          . predniSONE (DELTASONE) 5 MG tablet   Oral   Take 5 mg by mouth every morning.         . sertraline (ZOLOFT) 25 MG tablet   Oral   Take 25 mg by mouth at bedtime.           BP 181/60  Pulse 49  Temp(Src) 97.5 F (36.4 C) (Oral)  Resp 21  SpO2 97% Physical Exam  Nursing note and vitals reviewed. Constitutional: She is oriented to person, place, and time. She appears well-nourished.  Chronically ill, mentating well   HENT:  Head: Normocephalic.  Mouth/Throat: Oropharynx is clear and moist.  Eyes: Conjunctivae are normal. Pupils are equal, round, and reactive to light.  Neck: Normal range of motion. Neck supple.  Cardiovascular: Normal heart sounds.   Bradycardic, regular    Pulmonary/Chest: Effort normal and breath sounds normal. No respiratory distress. She has no wheezes. She has no rales.  Abdominal: Soft. Bowel sounds are normal. She exhibits no distension. There is no tenderness. There is no rebound and no guarding.  Musculoskeletal: Normal range of motion.  Neurological: She is alert and oriented to person, place, and time. No cranial nerve deficit. Coordination normal.  Skin: Skin is warm and dry.  Psychiatric: She has a normal mood and affect. Her behavior is normal. Judgment and thought content normal.    ED Course  Procedures (including critical care time) Labs Review Labs Reviewed  CBC WITH DIFFERENTIAL - Abnormal; Notable for the following:    Monocytes Relative 16 (*)    Monocytes Absolute 1.6 (*)    All other components within normal limits  COMPREHENSIVE METABOLIC PANEL - Abnormal; Notable for the following:    Glucose, Bld 100 (*)    BUN 49 (*)    Creatinine, Ser 1.64 (*)    GFR calc non Af Amer 26 (*)    GFR calc Af Amer 30 (*)    All other components within normal limits  Randolm Idol, ED   Imaging Review Dg Chest Port 1 View  04/02/2013   CLINICAL DATA:  Shortness of breath, chest pain, hypertension  EXAM: PORTABLE CHEST - 1 VIEW  COMPARISON:  08/26/2012  FINDINGS: Cardiomegaly. No confluent airspace opacities or effusions. No edema. No acute bony abnormality.  IMPRESSION: Cardiomegaly.  No active disease.   Electronically Signed   By: Rolm Baptise M.D.   On: 04/02/2013 13:53     EKG Interpretation None      MDM   Final diagnoses:  Symptomatic bradycardia   Christina Cabrera is a 80 y.o. female here with bradycardia. BP stable mentating well. HR in the low 40s. Cardiology consulted and will admit. Patient placed on pacer pads  but doesn't require pacing or IV meds.     Wandra Arthurs, MD 04/02/13 [phone]

## 2013-04-02 NOTE — H&P (Addendum)
History and Physical   Patient ID: Christina Cabrera MRN: [id number], DOB/AGE: 03/19/1917 80 y.o. Date of Encounter: 04/02/2013  Primary Physician: Gennette Pac, MD Primary Cardiologist: Saundra Shelling   Chief Complaint:  Bradycardia  HPI: Christina Cabrera is a 80 y.o. female with no history of CAD. She has worn an event monitor in the past, results not available, but no medication changes were made and no PPM recommended. She last saw Dr. Irish Lack about 2 years ago.  She has been feeling palpitations at times all week. She will feel her heart beat normally several times and then an abnormal beat. She has been tired and generally not feeling well. Today, she took her usual morning medications, including Bystolic, clonidine and amlodipine. She got her hair done, but knew something was not right. She went to the nurse at [facility] and had a low heart rate. She came to the ER as recommended. She is in sinus bradycardia, with a heart rate in the 40s. She has not had any skipped beats on telemetry or long pauses. She has not had chest pain or SOB.   Past Medical History  Diagnosis Date  . Hypertension   . Arthritis   . Diverticulitis   . Irritable bowel   . PMR (polymyalgia rheumatica)   . Spinal stenosis   . Thyroid disease   . Chronic kidney disease     Stage III/IV  . Hyperlipidemia   . Cataract   . Anxiety   . Osteoporosis     Surgical History:  Past Surgical History  Procedure Laterality Date  . Back surgery    . Appendectomy    . Cholecystectomy    . Eye surgery    . Abdominal hysterectomy       I have reviewed the patient's current medications. Prior to Admission medications   Medication Sig Start Date End Date Taking? Authorizing Provider  ALPRAZolam Duanne Moron) 0.5 MG tablet Take 0.25 mg by mouth 3 (three) times daily.    Yes Historical Provider, MD  amLODipine (NORVASC) 2.5 MG tablet Take 2.5 mg by mouth daily.   Yes Historical Provider, MD  Cholecalciferol  (VITAMIN D) 2000 UNITS CAPS Take 2,000 Units by mouth daily.   Yes Historical Provider, MD  cloNIDine (CATAPRES) 0.1 MG tablet Take 0.05 mg by mouth at bedtime. Pt takes 1/2 tab for 0.05mg  dose   Yes Historical Provider, MD  furosemide (LASIX) 40 MG tablet Take 40 mg by mouth every morning.    Yes Historical Provider, MD  hydrALAZINE (APRESOLINE) 50 MG tablet Take 50 mg by mouth 2 (two) times daily.   Yes Historical Provider, MD  HYDROcodone-acetaminophen (NORCO) 10-325 MG per tablet Take 1 tablet by mouth every 6 (six) hours as needed. pain 07/13/11  Yes Adeline C Viyuoh, MD  Ketotifen Fumarate (ALLERGY EYE DROPS OP) Apply 1 drop to eye 2 (two) times daily.   Yes Historical Provider, MD  levothyroxine (SYNTHROID, LEVOTHROID) 50 MCG tablet Take 25 mcg by mouth daily. Pt takes 1/2 tab for 37mcg dose   Yes Historical Provider, MD  methylcellulose (CITRUCEL) oral powder Take 1 packet by mouth daily.   Yes Historical Provider, MD  nebivolol (BYSTOLIC) 10 MG tablet Take 10 mg by mouth every morning.   Yes Historical Provider, MD  omeprazole (PRILOSEC) 20 MG capsule Take 20 mg by mouth daily.   Yes Historical Provider, MD  Potassium Chloride CR (MICRO-K) 8 MEQ CPCR Take 16 mEq by mouth daily.  Yes Historical Provider, MD  predniSONE (DELTASONE) 5 MG tablet Take 5 mg by mouth every morning.   Yes Historical Provider, MD  sertraline (ZOLOFT) 25 MG tablet Take 25 mg by mouth at bedtime.    Yes Historical Provider, MD   Scheduled Meds: Continuous Infusions: PRN Meds:.    Allergies:  Allergies  Allergen Reactions  . Quinolones Other (See Comments)    platelets    History   Social History  . Marital Status: Widowed    Spouse Name: N/A    Number of Children: N/A  . Years of Education: N/A   Occupational History  . Retired Therapist, sports    Social History Main Topics  . Smoking status: Never Smoker   . Smokeless tobacco: Never Used  . Alcohol Use: No  . Drug Use: No  . Sexual Activity: No   Other  Topics Concern  . Not on file   Social History Narrative   She is in [place] independent living.    Family History  Problem Relation Age of Onset  . Hyperlipidemia Mother   . Hypertension Mother   . Hypertension Father   . Hyperlipidemia Father   . Hyperlipidemia Sister   . Hypertension Sister   . Diabetes Neg Hx   . Heart attack Neg Hx   . Sudden death Neg Hx    Family Status  Relation Status Death Age  . Mother Deceased   . Father Deceased     Review of Systems:   Full 14-point review of systems otherwise negative except as noted above.  Physical Exam: Blood pressure 181/60, pulse 49, temperature 97.5 F (36.4 C), temperature source Oral, resp. rate 21, SpO2 97.00%. General: Well developed, well nourished,female in no acute distress. Head: Normocephalic, atraumatic, sclera non-icteric, no xanthomas, nares are without discharge. Dentition:  Neck: No carotid bruits. JVD not elevated. No thyromegally Lungs: Good expansion bilaterally. without wheezes or rhonchi.  Heart: IRRegular, slow rate and rhythm with S1 S2.  No S3 or S4.  No rubs, or gallops appreciated.  Grade II/VI murmur noted best heard at L 2nd ICS.   Abdomen: Soft, non-tender, non-distended with normoactive bowel sounds. No hepatomegaly. No rebound/guarding. No obvious abdominal masses. Msk:  Strength and tone appear normal for age. No joint deformities or effusions, no spine or costo-vertebral angle tenderness. Extremities: No clubbing or cyanosis. No edema.  Distal pedal pulses are 2+ in 4 extrem Neuro: Alert and oriented X 3. Moves all extremities spontaneously. No focal deficits noted. Psych:  Responds to questions appropriately with a normal affect. Skin: No rashes or lesions noted  Labs:   Lab Results  Component Value Date   WBC 10.0 04/02/2013   HGB 14.5 04/02/2013   HCT 43.5 04/02/2013   MCV 86.7 04/02/2013   PLT 194 04/02/2013     Recent Labs Lab 04/02/13 1336  NA 141  K 3.9  CL 98  CO2  24  BUN 49*  CREATININE 1.64*  CALCIUM 9.5  PROT 7.9  BILITOT 0.7  ALKPHOS 58  ALT 15  AST 24  GLUCOSE 100*    Recent Labs  04/02/13 1337  TROPIPOC 0.02    Radiology/Studies: Dg Chest Port 1 View 04/02/2013   CLINICAL DATA:  Shortness of breath, chest pain, hypertension  EXAM: PORTABLE CHEST - 1 VIEW  COMPARISON:  08/26/2012  FINDINGS: Cardiomegaly. No confluent airspace opacities or effusions. No edema. No acute bony abnormality.  IMPRESSION: Cardiomegaly.  No active disease.   Electronically Signed  By: Rolm Baptise M.D.   On: 04/02/2013 13:53    ECG:  02-Apr-2013 13:25:49    Sinus Rhythm Low voltage, extremity leads Prolonged QT interval Vent. rate 49 BPM PR interval * ms QRS duration 105 ms QT/QTc 564/509 ms P-R-T axes -1 -7 26  ASSESSMENT AND PLAN:  Principal Problem:   Symptomatic bradycardia - admit, ck TSH, hold rate-lowering medications, follow HR on telemetry  Active Problems:   CKD (chronic kidney disease), stage IV - follow labs, she is at baseline.     Hypothyroidism - ck TSH    HTN (hypertension) - Will need adjustment of medications, follow closely  Signed, Rosaria Ferries, PA-C 04/02/2013 3:32 PM Beeper [phone]  I have seen and evaluated the patient this PM along with Rosaria Ferries, PA-C. I agree with her findings, examination as well as impression recommendations.  Very pleasant, spry 80 y/o (almost 69) woman with clearly difficult to control HTN & hypothyroidism who saw Dr. Lendell Caprice yrs ago for palpitaions and wore a monitor that did not reveal anything. She usual state of health until the last few weeks when she started noting frequent irregular heartbeats and a sensation of pounding in ears. He is also been feeling somewhat tired and ill his last few days. This morning she took her pills and went to the hair salon in [place]. Her medications include Bystolic and clonidine as well as amlodipine and hydralazine. Unfortunately when she  started walking back to her apartment, she really started feeling poorly. She thought her heart rate noted that her pulses were probably in the 30s to low 40s. She had her pulses checked there and they were in the low 40s to high 30s. He is also noted that it was more the irregular beating that bothered her then the slow rate. She denies any other cardiac symptoms his chest tightness or pressure with rest or exertion besides his fatigue. No PND, orthopnea or edema.  She is not truly felt near syncopal, but has felt quite weak and just "off ". No syncopal episodes. No TIA or amaurosis fugax symptoms.  No abnormal GI symptoms of nausea or vomiting that would suggest a possible vagal condition.  Her exam is relatively benign besides bradycardia in the low 40s. When we sat her up her heart rate did get up temporarily into the 60s. She is not toxic appearing but doesn't look like herself according to her daughter. She knows to be in no distress but just feels ill and tired. She has a soft murmur.  Overall my impression is that she has symptomatic bradycardia it may very well be iatrogenic in the setting of beta blocker and clonidine.  She does not have a prolonged PR interval, but does have a slightly prolonged QT interval.  For now the plan will be just expectant management while we're waiting her a minimally just to wear off. Because she is noted irregular heart beating I am concerned of a possible tachybradycardia situation. I am also concerned that she may be having some junctional escape beats. On QT concern that she could potentially have bradycardia mediated ventricular tachycardia.  At this point I will likely admit her to step down unit to monitor her while her heart rate recovered. She does not require temporary pacing at this time reaches history hypertension. We can use amlodipine and hydralazine for blood pressure as well as needed. We can take him to the rebound tachycardia effects from his  medications. My dictation will be that  her heart rate recovers off of AV nodal agents, however if not she would likely be a candidate for pacemaker placement by Electrophysiology.  Will check TSH levels as well as a.m. cortisol since she is on steroids. I don't think these are related.  Due to the concern for possible worsening of the bradycardia or Bradycardia Mediated VT,  we will admit her to the [facility] cardiac step down unit either 2H or 2C - to ensure that she is closely monitored in the same facility as the on-call physician.   Leonie Man, M.D., M.S. Interventional Cardiologist  [place] Pager # [phone] 04/02/2013

## 2013-04-03 DIAGNOSIS — I10 Essential (primary) hypertension: Secondary | ICD-10-CM | POA: Diagnosis not present

## 2013-04-03 DIAGNOSIS — I495 Sick sinus syndrome: Secondary | ICD-10-CM

## 2013-04-03 DIAGNOSIS — I498 Other specified cardiac arrhythmias: Secondary | ICD-10-CM | POA: Diagnosis not present

## 2013-04-03 DIAGNOSIS — N189 Chronic kidney disease, unspecified: Secondary | ICD-10-CM | POA: Diagnosis not present

## 2013-04-03 DIAGNOSIS — N184 Chronic kidney disease, stage 4 (severe): Secondary | ICD-10-CM | POA: Diagnosis not present

## 2013-04-03 DIAGNOSIS — I129 Hypertensive chronic kidney disease with stage 1 through stage 4 chronic kidney disease, or unspecified chronic kidney disease: Secondary | ICD-10-CM | POA: Diagnosis not present

## 2013-04-03 DIAGNOSIS — E039 Hypothyroidism, unspecified: Secondary | ICD-10-CM | POA: Diagnosis not present

## 2013-04-03 LAB — COMPREHENSIVE METABOLIC PANEL
ALT: 12 U/L (ref 0–35)
AST: 20 U/L (ref 0–37)
Albumin: 3.6 g/dL (ref 3.5–5.2)
Alkaline Phosphatase: 49 U/L (ref 39–117)
BUN: 42 mg/dL — AB (ref 6–23)
CO2: 26 mEq/L (ref 19–32)
CREATININE: 1.46 mg/dL — AB (ref 0.50–1.10)
Calcium: 8.7 mg/dL (ref 8.4–10.5)
Chloride: 101 mEq/L (ref 96–112)
GFR calc non Af Amer: 30 mL/min — ABNORMAL LOW (ref >90)
GFR, EST AFRICAN AMERICAN: 35 mL/min — AB (ref >90)
Glucose, Bld: 77 mg/dL (ref 70–99)
Potassium: 3.3 mEq/L — ABNORMAL LOW (ref 3.7–5.3)
Sodium: 143 mEq/L (ref 137–147)
TOTAL PROTEIN: 6.2 g/dL (ref 6.0–8.3)
Total Bilirubin: 0.6 mg/dL (ref 0.3–1.2)

## 2013-04-03 LAB — T4, FREE: Free T4: 1.62 ng/dL (ref 0.80–1.80)

## 2013-04-03 LAB — TSH: TSH: 1.186 u[IU]/mL (ref 0.350–4.500)

## 2013-04-03 LAB — CORTISOL-AM, BLOOD: Cortisol - AM: 8.7 ug/dL (ref 4.3–22.4)

## 2013-04-03 LAB — TROPONIN I

## 2013-04-03 MED ORDER — AMLODIPINE BESYLATE 2.5 MG PO TABS
2.5000 mg | ORAL_TABLET | Freq: Every day | ORAL | Status: DC
  Administered 2013-04-03 – 2013-04-04 (×2): 2.5 mg via ORAL
  Filled 2013-04-03 (×2): qty 1

## 2013-04-03 MED ORDER — HYDRALAZINE HCL 50 MG PO TABS
50.0000 mg | ORAL_TABLET | Freq: Three times a day (TID) | ORAL | Status: DC
  Administered 2013-04-03 – 2013-04-04 (×3): 50 mg via ORAL
  Filled 2013-04-03 (×6): qty 1

## 2013-04-03 MED ORDER — SORBITOL 70 % PO SOLN
30.0000 mL | Freq: Every day | ORAL | Status: DC | PRN
  Administered 2013-04-03: 30 mL via ORAL
  Filled 2013-04-03 (×2): qty 30

## 2013-04-03 MED ORDER — FLEET ENEMA 7-19 GM/118ML RE ENEM
1.0000 | ENEMA | Freq: Every day | RECTAL | Status: DC | PRN
  Administered 2013-04-03: 1 via RECTAL
  Filled 2013-04-03: qty 1

## 2013-04-03 NOTE — Progress Notes (Signed)
  Echocardiogram 2D Echocardiogram has been performed.  Corby Villasenor FRANCES 04/03/2013, 1:01 PM

## 2013-04-03 NOTE — Progress Notes (Signed)
Subjective:   80 y/o woman with severe HTN and CKD III admitted with symptomatic bradycardia with HR in 40s.   Bystolic and clonidine held. HR in 50s now SBP 140-197.   C/o constipation  Echo done this am  EF normal mild AS  Intake/Output Summary (Last 24 hours) at 04/03/13 1244 Last data filed at 04/03/13 0800  Gross per 24 hour  Intake    360 ml  Output   1300 ml  Net   -940 ml    Current meds: . ALPRAZolam  0.25 mg Oral TID  . cholecalciferol  2,000 Units Oral Daily  . enoxaparin (LOVENOX) injection  30 mg Subcutaneous Q24H  . furosemide  40 mg Oral q morning - 10a  . hydrALAZINE  50 mg Oral BID  . ketotifen  1 drop Both Eyes BID  . levothyroxine  25 mcg Oral QAC breakfast  . pantoprazole  40 mg Oral Daily  . potassium chloride  20 mEq Oral Daily  . predniSONE  5 mg Oral Q breakfast  . psyllium  1 packet Oral Daily  . sertraline  25 mg Oral QHS   Infusions: . sodium chloride       Objective:  Blood pressure 142/45, pulse 51, temperature 98.4 F (36.9 C), temperature source Oral, resp. rate 15, height 5\' 4"  (1.626 m), weight 69.1 kg (152 lb 5.4 oz), SpO2 97.00%. Weight change:   Physical Exam: General:  Elderly Well appearing. No resp difficulty HEENT: normal Neck: supple. JVP 7. Carotids 2+ bilat; no bruits. No lymphadenopathy or thryomegaly appreciated. Cor: PMI nondisplaced. Regular rate & rhythm. 2/6 SEM RSB Lungs: clear Abdomen: soft, nontender, nondistended. No hepatosplenomegaly. No bruits or masses. Good bowel sounds. Extremities: no cyanosis, clubbing, rash, edema Neuro: alert & orientedx3, cranial nerves grossly intact. moves all 4 extremities w/o difficulty. Affect pleasant  Telemetry: SB 50-60s  Lab Results: Basic Metabolic Panel:  Recent Labs Lab 04/02/13 1336 04/03/13 0315  NA 141 143  K 3.9 3.3*  CL 98 101  CO2 24 26  GLUCOSE 100* 77  BUN 49* 42*  CREATININE 1.64* 1.46*  CALCIUM 9.5 8.7   Liver Function Tests:  Recent  Labs Lab 04/02/13 1336 04/03/13 0315  AST 24 20  ALT 15 12  ALKPHOS 58 49  BILITOT 0.7 0.6  PROT 7.9 6.2  ALBUMIN 4.5 3.6   No results found for this basename: LIPASE, AMYLASE,  in the last 168 hours No results found for this basename: AMMONIA,  in the last 168 hours CBC:  Recent Labs Lab 04/02/13 1336  WBC 10.0  NEUTROABS 7.0  HGB 14.5  HCT 43.5  MCV 86.7  PLT 194   Cardiac Enzymes:  Recent Labs Lab 04/02/13 1712 04/02/13 2246 04/03/13 0315  TROPONINI <0.30 <0.30 <0.30   BNP: No components found with this basename: POCBNP,  CBG: No results found for this basename: GLUCAP,  in the last 168 hours Microbiology: Lab Results  Component Value Date   CULT NO GROWTH 06/02/2012   CULT NO GROWTH 5 DAYS 06/02/2012   CULT NO GROWTH 5 DAYS 06/02/2012   No results found for this basename: CULT, SDES,  in the last 168 hours  Imaging: Dg Chest Port 1 View  04/02/2013   CLINICAL DATA:  Shortness of breath, chest pain, hypertension  EXAM: PORTABLE CHEST - 1 VIEW  COMPARISON:  08/26/2012  FINDINGS: Cardiomegaly. No confluent airspace opacities or effusions. No edema. No acute bony abnormality.  IMPRESSION: Cardiomegaly.  No active disease.  Electronically Signed   By: Rolm Baptise M.D.   On: 04/02/2013 13:53     ASSESSMENT:  1. Symptomatic bradycardia 2. Severe HTN 3. CKD III  PLAN/DISCUSSION:  She is improved. Doubt she will need PPM.  Will titrate hydralazine to 50 tid. Amlodipine 2.5 daily.   Hopefully she can go home tomorrow. Sorbitol for constipation.   Long d/w patient and family.    LOS: 1 day    Glori Bickers, MD 04/03/2013, 12:44 PM

## 2013-04-04 ENCOUNTER — Other Ambulatory Visit: Payer: Self-pay | Admitting: Physician Assistant

## 2013-04-04 ENCOUNTER — Encounter (HOSPITAL_COMMUNITY): Payer: Self-pay | Admitting: Physician Assistant

## 2013-04-04 DIAGNOSIS — I491 Atrial premature depolarization: Secondary | ICD-10-CM | POA: Diagnosis present

## 2013-04-04 DIAGNOSIS — I498 Other specified cardiac arrhythmias: Secondary | ICD-10-CM | POA: Diagnosis not present

## 2013-04-04 DIAGNOSIS — E876 Hypokalemia: Secondary | ICD-10-CM

## 2013-04-04 DIAGNOSIS — I129 Hypertensive chronic kidney disease with stage 1 through stage 4 chronic kidney disease, or unspecified chronic kidney disease: Secondary | ICD-10-CM | POA: Diagnosis not present

## 2013-04-04 DIAGNOSIS — I350 Nonrheumatic aortic (valve) stenosis: Secondary | ICD-10-CM | POA: Diagnosis present

## 2013-04-04 DIAGNOSIS — E039 Hypothyroidism, unspecified: Secondary | ICD-10-CM | POA: Diagnosis not present

## 2013-04-04 DIAGNOSIS — I10 Essential (primary) hypertension: Secondary | ICD-10-CM | POA: Diagnosis not present

## 2013-04-04 DIAGNOSIS — N184 Chronic kidney disease, stage 4 (severe): Secondary | ICD-10-CM | POA: Diagnosis not present

## 2013-04-04 LAB — BASIC METABOLIC PANEL
BUN: 39 mg/dL — AB (ref 6–23)
CALCIUM: 8.5 mg/dL (ref 8.4–10.5)
CO2: 23 mEq/L (ref 19–32)
Chloride: 97 mEq/L (ref 96–112)
Creatinine, Ser: 1.55 mg/dL — ABNORMAL HIGH (ref 0.50–1.10)
GFR calc Af Amer: 32 mL/min — ABNORMAL LOW (ref >90)
GFR, EST NON AFRICAN AMERICAN: 28 mL/min — AB (ref >90)
Glucose, Bld: 90 mg/dL (ref 70–99)
Potassium: 3.2 mEq/L — ABNORMAL LOW (ref 3.7–5.3)
Sodium: 136 mEq/L — ABNORMAL LOW (ref 137–147)

## 2013-04-04 MED ORDER — POTASSIUM CHLORIDE CRYS ER 20 MEQ PO TBCR
20.0000 meq | EXTENDED_RELEASE_TABLET | Freq: Once | ORAL | Status: AC
  Administered 2013-04-04: 20 meq via ORAL
  Filled 2013-04-04: qty 1

## 2013-04-04 MED ORDER — HYDRALAZINE HCL 50 MG PO TABS: 50.0000 mg | ORAL_TABLET | Freq: Three times a day (TID) | ORAL | Status: AC

## 2013-04-04 MED ORDER — POTASSIUM CHLORIDE CRYS ER 20 MEQ PO TBCR: 20.0000 meq | EXTENDED_RELEASE_TABLET | Freq: Two times a day (BID) | ORAL | Status: DC

## 2013-04-04 NOTE — Discharge Summary (Signed)
Discharge Summary   [name] MRN: [id number], DOB/AGE: [birth year] 80 y.o. Admit date: 04/02/2013 D/C date:     04/04/2013  Primary Care Provider: Gennette Pac, MD Primary Cardiologist: Irish Lack  Primary Discharge Diagnoses:  1. Symptomatic bradycardia, improved with med discontinuation 2. HTN 3. CKD stage III-IV 4. Mild AS by echo this admission 5. Hypertension 6. Hypothyroidism - euthyroid this admission 7. Hypokalemia  Secondary Discharge Diagnoses:  1. Arthritis   2. Diverticulitis   3. Irritable bowel   4. PMR (polymyalgia rheumatica), on chronic prednisone 5. Spinal stenosis   6. Hyperlipidemia   7. Cataract   8. Anxiety   9. Osteoporosis   Hospital Course: Christina Cabrera is a 80 y/o F with HTN, arthritis, hyperlipidemia and no prior CAD who presented to [facility] on 03/16/13 with complaints of palpitations. She has worn an event monitor in Christina past, results not available, but no medication changes were made and no PPM recommended. She has been feeling palpitations all week. She will feel her beat normally several times and then an abnormal beat. She has been feeling tired. She got her hair done and knew something was not right. She went to a nurse at [facility] and had a low HR and was instructed to come to Christina ER. There she was found to be in SB in Christina 40's without CP or SOB.  EKG showed SB without prolonged PR interval and slightly prolonged QT interval. She was admitted to Christina hospital for further observation. Her Bystolic and clonidine were discontinued. Amlodipine was also initially held. Troponins were negative. Cr was felt to be at baseline. Thyroid function was normal and AM cortisol level was normal. Echocardiogram showed normal LV function EF 65-70, no RWMA, and mild AS. Her BP did go up after discontinuation of aforementioned meds, so amlodipine was restarted and hydralazine was added. Further monitoring on revealed SB 50-60s with  PACs likely accounting for Christina irregular heartbeat sensation she was feeling. She felt better Christina remainder of her hospitalization. Her potassium was noted to be slightly low during hospitalization so was increased to 21meq twice a day. (Was on 47meq daily at home but K fell to 3.4->3.2 while in hospital on KCl 49meq daily). She received additional supplementation prior to dc. Today she is feeling well. She is aware that she may require pacemaker implantation in Christina future but at this time it is not warranted. She was instructed to call/return for recurrent symptoms. Dr. Haroldine Laws has seen and examined Christina Cabrera today and feels she is stable for discharge.  I have left a message on our office's scheduling voicemail requesting a follow-up appointment, and [name] with this appointment. Have requested BMET in about 5 days. Have written 30 day rx's for local pharmacy pickup and sent in mail order rx electronically.     Discharge Vitals: Blood pressure 155/52, pulse 49, temperature 98.4 F (36.9 C), temperature source Oral, resp. rate 16, height 5\' 4"  (1.626 m), weight 153 lb 10.6 oz (69.7 kg), SpO2 93-97 RA%.  General: Well developed, well nourished elderly WF in no acute distress. Head: Normocephalic, atraumatic, sclera non-icteric, no xanthomas, nares are without discharge. Neck:  JVP not elevated. Lungs: Clear bilaterally to auscultation without wheezes, rales, or rhonchi. Breathing is unlabored. Heart: RRR S1 S2, mild SEM, no rubs or gallops.  Abdomen: Soft, non-tender, non-distended with normoactive bowel sounds. No rebound/guarding. Extremities: No clubbing or cyanosis. No edema. Distal pedal  pulses are 2+ and equal bilaterally. Neuro: Alert and oriented X 3. Moves all extremities spontaneously. Psych:  Responds to questions appropriately with a normal affect.   Telemetry: SB 50s-60s with occ PACs, rare dips to Christina upper 40s   Labs: Lab Results  Component Value  Date   WBC 10.0 04/02/2013   HGB 14.5 04/02/2013   HCT 43.5 04/02/2013   MCV 86.7 04/02/2013   PLT 194 04/02/2013    Recent Labs Lab 04/03/13 0315 04/04/13 0329  NA 143 136*  K 3.3* 3.2*  CL 101 97  CO2 26 23  BUN 42* 39*  CREATININE 1.46* 1.55*  CALCIUM 8.7 8.5  PROT 6.2  --   BILITOT 0.6  --   ALKPHOS 49  --   ALT 12  --   AST 20  --   GLUCOSE 77 90    Recent Labs  04/02/13 1712 04/02/13 2246 04/03/13 0315  TROPONINI <0.30 <0.30 <0.30    Diagnostic Studies/Procedures   Dg Chest Port 1 View 04/02/2013   CLINICAL DATA:  Shortness of breath, chest pain, hypertension  EXAM: PORTABLE CHEST - 1 VIEW  COMPARISON:  08/26/2012  FINDINGS: Cardiomegaly. No confluent airspace opacities or effusions. No edema. No acute bony abnormality.  IMPRESSION: Cardiomegaly.  No active disease.   Electronically Signed   By: Rolm Baptise M.D.   On: 04/02/2013 13:53   2D Echo 04/03/13 Left ventricle: Christina cavity size was normal. Systolic function was vigorous. Christina estimated ejection fraction was in Christina range of 65% to 70%. Wall motion was normal; there were no regional wall motion abnormalities.   Discharge Medications   Current Discharge Medication List    START taking these medications   Details  potassium chloride SA (K-DUR,KLOR-CON) 20 MEQ tablet Take 1 tablet (20 mEq total) by mouth 2 (two) times daily. Qty: 60 tablet, Refills: 2      CONTINUE these medications which have CHANGED   Details  hydrALAZINE (APRESOLINE) 50 MG tablet Take 1 tablet (50 mg total) by mouth 3 (three) times daily. (Every 8 hours) Qty: 90 tablet, Refills: 2      CONTINUE these medications which have NOT CHANGED   Details  ALPRAZolam (XANAX) 0.5 MG tablet Take 0.25 mg by mouth 3 (three) times daily.     amLODipine (NORVASC) 2.5 MG tablet Take 2.5 mg by mouth daily.    Cholecalciferol (VITAMIN D) 2000 UNITS CAPS Take 2,000 Units by mouth daily.    furosemide (LASIX) 40 MG tablet Take 40 mg by mouth every  morning.     HYDROcodone-acetaminophen (NORCO) 10-325 MG per tablet Take 1 tablet by mouth every 6 (six) hours as needed. pain    Ketotifen Fumarate (ALLERGY EYE DROPS OP) Apply 1 drop to eye 2 (two) times daily.    levothyroxine (SYNTHROID, LEVOTHROID) 50 MCG tablet Take 25 mcg by mouth daily. Pt takes 1/2 tab for 44mcg dose    methylcellulose (CITRUCEL) oral powder Take 1 packet by mouth daily.    omeprazole (PRILOSEC) 20 MG capsule Take 20 mg by mouth daily.    predniSONE (DELTASONE) 5 MG tablet Take 5 mg by mouth every morning.    sertraline (ZOLOFT) 25 MG tablet Take 25 mg by mouth at bedtime.       STOP taking these medications     cloNIDine (CATAPRES) 0.1 MG tablet      nebivolol (BYSTOLIC) 10 MG tablet      Potassium Chloride CR (MICRO-K) 8 MEQ CPCR  Disposition   Christina Cabrera will be discharged in stable condition to home. Discharge Orders   Future Orders Complete By Expires   Diet - low sodium heart healthy  As directed    Discharge instructions  As directed    Comments:     Call/return for dizziness, lightheadedness, shortness of breath, chest pain or any other symptoms concerning to you. If heart rate stays in Christina 40's, call your doctor (or proceed to ER if you are feeling poorly).   Increase activity slowly  As directed      Follow-up Information   Follow up with Jettie Booze., MD. (Our office will call you for a follow-up appointment and labwork to recheck your potassium. Please call Christina office if you have not heard from [place] within 3 days.)    Specialty:  Cardiology   Contact information:   [address] [place] [address] [phone]         Duration of Discharge Encounter: Greater than 30 minutes including physician and PA time.  Signed, Melina Copa PA-C 04/04/2013, 8:46 AM  Cabrera seen and examined with Melina Copa, PA-C. We discussed all aspects of Christina encounter. I agree with Christina assessment and plan as stated  above. She is improved. HR remains in 50-60s off AV nodal agents.BP stable. OK for d/c today. Explained to her that she may need PPM at some point in future.   Eloy Fehl,MD 10:04 PM

## 2013-04-11 ENCOUNTER — Encounter: Payer: Self-pay | Admitting: *Deleted

## 2013-04-12 ENCOUNTER — Emergency Department (HOSPITAL_COMMUNITY)
Admission: EM | Admit: 2013-04-12 | Discharge: 2013-04-12 | Disposition: A | Discharge status: Home / Self Care | Payer: Medicare Other | Attending: Emergency Medicine | Admitting: Emergency Medicine

## 2013-04-12 ENCOUNTER — Emergency Department (HOSPITAL_COMMUNITY): Payer: Medicare Other

## 2013-04-12 ENCOUNTER — Encounter (HOSPITAL_COMMUNITY): Payer: Self-pay | Admitting: Emergency Medicine

## 2013-04-12 DIAGNOSIS — Z9889 Other specified postprocedural states: Secondary | ICD-10-CM | POA: Insufficient documentation

## 2013-04-12 DIAGNOSIS — Y9301 Activity, walking, marching and hiking: Secondary | ICD-10-CM | POA: Insufficient documentation

## 2013-04-12 DIAGNOSIS — S139XXA Sprain of joints and ligaments of unspecified parts of neck, initial encounter: Secondary | ICD-10-CM | POA: Insufficient documentation

## 2013-04-12 DIAGNOSIS — S76011A Strain of muscle, fascia and tendon of right hip, initial encounter: Secondary | ICD-10-CM

## 2013-04-12 DIAGNOSIS — M25569 Pain in unspecified knee: Secondary | ICD-10-CM | POA: Insufficient documentation

## 2013-04-12 DIAGNOSIS — S0993XA Unspecified injury of face, initial encounter: Secondary | ICD-10-CM | POA: Diagnosis not present

## 2013-04-12 DIAGNOSIS — Z79899 Other long term (current) drug therapy: Secondary | ICD-10-CM | POA: Diagnosis not present

## 2013-04-12 DIAGNOSIS — Y92009 Unspecified place in unspecified non-institutional (private) residence as the place of occurrence of the external cause: Secondary | ICD-10-CM | POA: Insufficient documentation

## 2013-04-12 DIAGNOSIS — G8929 Other chronic pain: Secondary | ICD-10-CM | POA: Diagnosis not present

## 2013-04-12 DIAGNOSIS — S79919A Unspecified injury of unspecified hip, initial encounter: Secondary | ICD-10-CM | POA: Diagnosis not present

## 2013-04-12 DIAGNOSIS — N184 Chronic kidney disease, stage 4 (severe): Secondary | ICD-10-CM | POA: Insufficient documentation

## 2013-04-12 DIAGNOSIS — M129 Arthropathy, unspecified: Secondary | ICD-10-CM | POA: Insufficient documentation

## 2013-04-12 DIAGNOSIS — F411 Generalized anxiety disorder: Secondary | ICD-10-CM | POA: Diagnosis not present

## 2013-04-12 DIAGNOSIS — M533 Sacrococcygeal disorders, not elsewhere classified: Secondary | ICD-10-CM | POA: Diagnosis not present

## 2013-04-12 DIAGNOSIS — S79929A Unspecified injury of unspecified thigh, initial encounter: Secondary | ICD-10-CM | POA: Diagnosis not present

## 2013-04-12 DIAGNOSIS — Z8669 Personal history of other diseases of the nervous system and sense organs: Secondary | ICD-10-CM | POA: Diagnosis not present

## 2013-04-12 DIAGNOSIS — IMO0002 Reserved for concepts with insufficient information to code with codable children: Secondary | ICD-10-CM | POA: Insufficient documentation

## 2013-04-12 DIAGNOSIS — M545 Low back pain, unspecified: Secondary | ICD-10-CM | POA: Diagnosis not present

## 2013-04-12 DIAGNOSIS — S0990XA Unspecified injury of head, initial encounter: Secondary | ICD-10-CM | POA: Diagnosis not present

## 2013-04-12 DIAGNOSIS — I10 Essential (primary) hypertension: Secondary | ICD-10-CM | POA: Diagnosis not present

## 2013-04-12 DIAGNOSIS — Z8719 Personal history of other diseases of the digestive system: Secondary | ICD-10-CM | POA: Diagnosis not present

## 2013-04-12 DIAGNOSIS — S300XXA Contusion of lower back and pelvis, initial encounter: Secondary | ICD-10-CM

## 2013-04-12 DIAGNOSIS — Z862 Personal history of diseases of the blood and blood-forming organs and certain disorders involving the immune mechanism: Secondary | ICD-10-CM | POA: Insufficient documentation

## 2013-04-12 DIAGNOSIS — Z8639 Personal history of other endocrine, nutritional and metabolic disease: Secondary | ICD-10-CM | POA: Diagnosis not present

## 2013-04-12 DIAGNOSIS — S20229A Contusion of unspecified back wall of thorax, initial encounter: Secondary | ICD-10-CM | POA: Diagnosis not present

## 2013-04-12 DIAGNOSIS — S161XXA Strain of muscle, fascia and tendon at neck level, initial encounter: Secondary | ICD-10-CM

## 2013-04-12 DIAGNOSIS — W1809XA Striking against other object with subsequent fall, initial encounter: Secondary | ICD-10-CM | POA: Insufficient documentation

## 2013-04-12 DIAGNOSIS — S199XXA Unspecified injury of neck, initial encounter: Secondary | ICD-10-CM | POA: Diagnosis not present

## 2013-04-12 DIAGNOSIS — E039 Hypothyroidism, unspecified: Secondary | ICD-10-CM | POA: Diagnosis not present

## 2013-04-12 DIAGNOSIS — M25559 Pain in unspecified hip: Secondary | ICD-10-CM | POA: Diagnosis not present

## 2013-04-12 DIAGNOSIS — W19XXXA Unspecified fall, initial encounter: Secondary | ICD-10-CM

## 2013-04-12 MED ORDER — OXYCODONE-ACETAMINOPHEN 5-325 MG PO TABS
1.0000 | ORAL_TABLET | Freq: Once | ORAL | Status: AC
  Administered 2013-04-12: 1 via ORAL
  Filled 2013-04-12: qty 1

## 2013-04-12 NOTE — Progress Notes (Signed)
   CARE MANAGEMENT ED NOTE 04/12/2013  Patient:  PARTHENIA, TELLEFSEN   Account Number:  [id number]  Date Initiated:  04/12/2013  Documentation initiated by:  Livia Snellen  Subjective/Objective Assessment:   Patient presents to Ed post fall with pain in right hip     Subjective/Objective Assessment Detail:     Action/Plan:   Action/Plan Detail:   Anticipated DC Date:       Status Recommendation to Physician:   Result of Recommendation:    Other ED Services  Consult Working [place]  Other    Choice offered to / List presented to:            Status of service:  Completed, signed off  ED Comments:   ED Comments Detail:  EDCM spoke to patient and patient's daughter at bedside. patient reports she has been a resident at [facility] for almost [facility] years.  Patient reports she is in the independent Living section of the facility.  Patient has a walker, eleveated toilet seat, and a shower chair at home.  Paient lives alone.  Patient's daughter lives very close by and checks in on patient frequently.  Patient reports her facility has a resident Therapist, sports.  EDCM provided patient's daughter with a list of home health agencies in [facility].  EDCM explained with home health, patient may receive a visiting RN, PT, OT, aide and social worker if needed.  Patient reports the facility has their own agency called [facility]  [facility] called [facility] [phone] and spoke to Donavan Foil who was unsure of which home health agency they use.  EDCM asked Loe ellen if it was possible to send home health orders with the patient from the ED having the nursing staff at the facility arrange home health with their in house staff. Donavan Foil reported, "Yes, that would be the best thing to do."  [facility] discussed above with patient, patient's family and EDPA who are agreeable with this plan.  Patient and patient's daughter thankful for services.  Awaiting home health orders if  patient is discharged.  No further EDCM needs at this time.

## 2013-04-12 NOTE — ED Provider Notes (Signed)
CSN: [id number]     Arrival date & time 04/12/13  1845 History   First MD Initiated Contact with Patient 04/12/13 1918     Chief Complaint  Patient presents with  . Fall     (Consider location/radiation/quality/duration/timing/severity/associated sxs/prior Treatment) HPI Christina Cabrera is a 80 y.o. female who presents to emergency department after a fall. Patient states she was walk with her walker, reach for the door, lost balance and fell backwards. States hit her head on the ground. He reports pain in the lower back, coccyx, and right hip. Denies loss of consciousness. States is having a headache. No dizziness, no nausea, vomiting. No visual changes. Not anticoagulated. States has chronic bilateral knee pain, but did not injure her knees. States she crawled to the phone and called for help. Unable to ambulated on right hip. Did not take any pain medications.    Past Medical History  Diagnosis Date  . Hypertension   . Arthritis   . Diverticulitis   . Irritable bowel   . PMR (polymyalgia rheumatica)     a. On chronic prednisone.  Marland Kitchen Spinal stenosis   . Hypothyroidism   . Chronic kidney disease     Stage III/IV  . Hyperlipidemia   . Cataract   . Anxiety   . Osteoporosis   . Symptomatic bradycardia     a. Adm 03/2013 - taken off clonidine and BB.  Marland Kitchen Premature atrial contractions     a. Noted during adm 03/2013, corresponding with patient's palpitations.  . Aortic stenosis     a. Mild AS by echo 03/2013.  Marland Kitchen Hypokalemia    Past Surgical History  Procedure Laterality Date  . Back surgery    . Appendectomy    . Cholecystectomy    . Eye surgery    . Abdominal hysterectomy     Family History  Problem Relation Age of Onset  . Hyperlipidemia Mother   . Hypertension Mother   . Hypertension Father   . Hyperlipidemia Father   . Hyperlipidemia Sister   . Hypertension Sister   . Diabetes Neg Hx   . Heart attack Neg Hx   . Sudden death Neg Hx    History  Substance Use Topics   . Smoking status: Never Smoker   . Smokeless tobacco: Never Used  . Alcohol Use: No   OB History   Grav Para Term Preterm Abortions TAB SAB Ect Mult Living                 Review of Systems  Constitutional: Negative for fever and chills.  Respiratory: Negative for cough, chest tightness and shortness of breath.   Cardiovascular: Negative for chest pain, palpitations and leg swelling.  Gastrointestinal: Negative for nausea, vomiting, abdominal pain and diarrhea.  Genitourinary: Negative for dysuria, flank pain, vaginal bleeding, vaginal discharge, vaginal pain and pelvic pain.  Musculoskeletal: Positive for arthralgias, back pain, joint swelling and neck pain. Negative for myalgias and neck stiffness.  Skin: Negative for rash.  Neurological: Positive for headaches. Negative for dizziness and weakness.  All other systems reviewed and are negative.      Allergies  Quinolones  Home Medications   Current Outpatient Rx  Name  Route  Sig  Dispense  Refill  . ALPRAZolam (XANAX) 0.5 MG tablet   Oral   Take 0.25 mg by mouth 3 (three) times daily.          Marland Kitchen amLODipine (NORVASC) 2.5 MG tablet   Oral   Take  2.5 mg by mouth daily.         . Cholecalciferol (VITAMIN D) 2000 UNITS CAPS   Oral   Take 2,000 Units by mouth daily.         . furosemide (LASIX) 40 MG tablet   Oral   Take 40 mg by mouth every morning.          . hydrALAZINE (APRESOLINE) 50 MG tablet   Oral   Take 1 tablet (50 mg total) by mouth 3 (three) times daily. (Every 8 hours)   90 tablet   2   . HYDROcodone-acetaminophen (NORCO) 10-325 MG per tablet   Oral   Take 1 tablet by mouth every 6 (six) hours as needed. pain         . Ketotifen Fumarate (ALLERGY EYE DROPS OP)   Ophthalmic   Apply 1 drop to eye 2 (two) times daily.         Marland Kitchen levothyroxine (SYNTHROID, LEVOTHROID) 50 MCG tablet   Oral   Take 25 mcg by mouth daily. Pt takes 1/2 tab for 42mcg dose         . methylcellulose  (CITRUCEL) oral powder   Oral   Take 1 packet by mouth daily.         Marland Kitchen omeprazole (PRILOSEC) 20 MG capsule   Oral   Take 20 mg by mouth daily.         . potassium chloride SA (K-DUR,KLOR-CON) 20 MEQ tablet   Oral   Take 1 tablet (20 mEq total) by mouth 2 (two) times daily.   60 tablet   2   . predniSONE (DELTASONE) 5 MG tablet   Oral   Take 5 mg by mouth every morning.         . sertraline (ZOLOFT) 25 MG tablet   Oral   Take 25 mg by mouth at bedtime.           BP 139/56  Pulse 71  Temp(Src) 98.1 F (36.7 C) (Oral)  Resp 18  SpO2 97% Physical Exam  Nursing note and vitals reviewed. Constitutional: She is oriented to person, place, and time. She appears well-developed and well-nourished. No distress.  HENT:  Head: Normocephalic.  Eyes: Conjunctivae and EOM are normal. Pupils are equal, round, and reactive to light.  Neck: Neck supple.  Cardiovascular: Normal rate, regular rhythm and normal heart sounds.   Pulmonary/Chest: Effort normal and breath sounds normal. No respiratory distress. She has no wheezes. She has no rales.  Abdominal: Soft. Bowel sounds are normal. She exhibits no distension. There is no tenderness. There is no rebound.  Musculoskeletal: She exhibits no edema.  Tenderness over midline lumbar spine, right SI joint, sacrum midline. Tenderness over coccyx. No tenderness over the greater trochanter of the right hip. Pain with external rotation and flexion. No pain with internal rotation. Normal knees bilaterally. Normal left hip.  Neurological: She is alert and oriented to person, place, and time. No cranial nerve deficit. Coordination normal.  Skin: Skin is warm and dry.  Psychiatric: She has a normal mood and affect. Her behavior is normal.    ED Course  Procedures (including critical care time) Labs Review Labs Reviewed - No data to display Imaging Review Dg Lumbar Spine Complete  04/12/2013   CLINICAL DATA:  Fall.  Pain.  EXAM: LUMBAR SPINE  - COMPLETE 4+ VIEW  COMPARISON:  DG SACRUM/COCCYX dated 04/12/2013; DG ABD ACUTE W/CHEST dated 07/11/2011; DG LUMBAR SPINE 2-3V dated 09/04/2010; MR L SPINE  W/O dated 09/12/2010; CT ABD/PELV WO CM dated 07/11/2011  FINDINGS: Four views study shows convex leftward scoliosis of the upper lumbar spine. There is multilevel loss of disc height. Superimposition of the endplates around the K0-2 interspace on the lateral film has a similar appearance to the previous x-ray of 09/04/2010. Intervening lumbar spine MRI and abdomen/pelvis CT since that plain film exam show no fracture at this level and appearance is likely secondary to the scoliosis. Compression deformity of the T2-11 vertebral body is also stable since 09/04/2010.  IMPRESSION: Scoliosis with multilevel degenerative disc disease. Exam appears stable comparing back to 8/20 1/12.   Electronically Signed   By: Misty Stanley M.D.   On: 04/12/2013 20:20   Dg Sacrum/coccyx  04/12/2013   CLINICAL DATA:  Fall with pain in tailbone  EXAM: SACRUM AND COCCYX - 2+ VIEW  COMPARISON:  None.  FINDINGS: Three-view exam of the sacrum shows no evidence for sacral fracture. The arcuate lines are preserved bilaterally. SI joints are unremarkable. Lateral film shows no evidence for a displaced sacral fracture.  On the lateral projection, the L5 vertebral body has a trapezoidal shape and there is spondylolisthesis at L5-S1. Imaging features suggest L5 pars defects although they are not clearly visualized.  IMPRESSION: No evidence for sacral fracture.  Spondylolisthesis at L5-S1.   Electronically Signed   By: Misty Stanley M.D.   On: 04/12/2013 20:18   Dg Hip Complete Right  04/12/2013   CLINICAL DATA:  Fall with right hip pain.  EXAM: RIGHT HIP - COMPLETE 2+ VIEW  COMPARISON:  07/10/2005  FINDINGS: Frontal pelvis shows diffuse bony demineralization. No evidence for pelvic fracture. SI joints and symphysis pubis are unremarkable. Frontal and frog-leg lateral views of the right hip  show no evidence for femoral neck fracture. No worrisome lytic or sclerotic osseous abnormality. No substantial degenerative changes in the hip.  IMPRESSION: No acute bony findings.   Electronically Signed   By: Misty Stanley M.D.   On: 04/12/2013 20:16   Ct Head Wo Contrast  04/12/2013   CLINICAL DATA:  Fall with trauma to the head and neck.  EXAM: CT HEAD WITHOUT CONTRAST  CT CERVICAL SPINE WITHOUT CONTRAST  TECHNIQUE: Multidetector CT imaging of the head and cervical spine was performed following the standard protocol without intravenous contrast. Multiplanar CT image reconstructions of the cervical spine were also generated.  COMPARISON:  None.  FINDINGS: CT HEAD FINDINGS  The brain shows generalized atrophy. There is chronic small vessel change of the deep white matter. There is no sign of acute infarction, mass lesion, hemorrhage, hydrocephalus or extra-axial collection. No skull fracture. No fluid in the sinuses, middle ears or mastoids.  CT CERVICAL SPINE FINDINGS  Alignment is normal. No fracture. There is ordinary degenerative spondylosis at C5-6 and C6-7 with osteophytic encroachment upon the ventral canal in the foramina. Minimal facet degeneration. No soft tissue swelling. No soft tissue injury.  IMPRESSION: Head CT: No acute or traumatic finding. Mild age related atrophy and chronic small vessel change.  Cervical spine CT: No acute or traumatic finding. Ordinary age related spondylosis.   Electronically Signed   By: Nelson Chimes M.D.   On: 04/12/2013 20:07   Ct Cervical Spine Wo Contrast  04/12/2013   CLINICAL DATA:  Fall with trauma to the head and neck.  EXAM: CT HEAD WITHOUT CONTRAST  CT CERVICAL SPINE WITHOUT CONTRAST  TECHNIQUE: Multidetector CT imaging of the head and cervical spine was performed following the standard protocol without  intravenous contrast. Multiplanar CT image reconstructions of the cervical spine were also generated.  COMPARISON:  None.  FINDINGS: CT HEAD FINDINGS  The  brain shows generalized atrophy. There is chronic small vessel change of the deep white matter. There is no sign of acute infarction, mass lesion, hemorrhage, hydrocephalus or extra-axial collection. No skull fracture. No fluid in the sinuses, middle ears or mastoids.  CT CERVICAL SPINE FINDINGS  Alignment is normal. No fracture. There is ordinary degenerative spondylosis at C5-6 and C6-7 with osteophytic encroachment upon the ventral canal in the foramina. Minimal facet degeneration. No soft tissue swelling. No soft tissue injury.  IMPRESSION: Head CT: No acute or traumatic finding. Mild age related atrophy and chronic small vessel change.  Cervical spine CT: No acute or traumatic finding. Ordinary age related spondylosis.   Electronically Signed   By: Nelson Chimes M.D.   On: 04/12/2013 20:07     EKG Interpretation None      MDM   Final diagnoses:  Fall  Contusion of coccyx  Strain of right hip  Cervical strain  Minor head injury    Patient here after a mechanical fall at home. Complaining of a headache, neck pain, lower back pain, right hip pain. She is neurovascularly intact, appears to be in some pain. We'll get x-rays, CT head and cervical spine, pain medications ordered.   9:10 PM  Patient's x-rays or CTs are negative. She was given Percocet for her pain in emergency department. Her pain subsided. She is able to ambulate with a walker in the hallway.  Will d/c home, continue pain medications, follow up with pcp.   Filed Vitals:   04/12/13 1852  BP: 139/56  Pulse: 71  Temp: 98.1 F (36.7 C)  TempSrc: Oral  Resp: 18  SpO2: 97%       Renold Genta, PA-C 04/12/13 2111

## 2013-04-12 NOTE — ED Notes (Signed)
Per pt, states she fell backwards-lost footing with walker-hit back of head, no LOC-right hip pain

## 2013-04-12 NOTE — Discharge Instructions (Signed)
Continue pain medications at home. Rest. Careful with ambulating. Follow up with primary care doctor for recheck in 2-3 days.   Fall Prevention and Home Safety Falls cause injuries and can affect all age groups. It is possible to use preventive measures to significantly decrease the likelihood of falls. There are many simple measures which can make your home safer and prevent falls. OUTDOORS  Repair cracks and edges of walkways and driveways.  Remove high doorway thresholds.  Trim shrubbery on the main path into your home.  Have good outside lighting.  Clear walkways of tools, rocks, debris, and clutter.  Check that handrails are not broken and are securely fastened. Both sides of steps should have handrails.  Have leaves, snow, and ice cleared regularly.  Use sand or salt on walkways during winter months.  In the garage, clean up grease or oil spills. BATHROOM  Install night lights.  Install grab bars by the toilet and in the tub and shower.  Use non-skid mats or decals in the tub or shower.  Place a plastic non-slip stool in the shower to sit on, if needed.  Keep floors dry and clean up all water on the floor immediately.  Remove soap buildup in the tub or shower on a regular basis.  Secure bath mats with non-slip, double-sided rug tape.  Remove throw rugs and tripping hazards from the floors. BEDROOMS  Install night lights.  Make sure a bedside light is easy to reach.  Do not use oversized bedding.  Keep a telephone by your bedside.  Have a firm chair with side arms to use for getting dressed.  Remove throw rugs and tripping hazards from the floor. KITCHEN  Keep handles on pots and pans turned toward the center of the stove. Use back burners when possible.  Clean up spills quickly and allow time for drying.  Avoid walking on wet floors.  Avoid hot utensils and knives.  Position shelves so they are not too high or low.  Place commonly used objects  within easy reach.  If necessary, use a sturdy step stool with a grab bar when reaching.  Keep electrical cables out of the way.  Do not use floor polish or wax that makes floors slippery. If you must use wax, use non-skid floor wax.  Remove throw rugs and tripping hazards from the floor. STAIRWAYS  Never leave objects on stairs.  Place handrails on both sides of stairways and use them. Fix any loose handrails. Make sure handrails on both sides of the stairways are as long as the stairs.  Check carpeting to make sure it is firmly attached along stairs. Make repairs to worn or loose carpet promptly.  Avoid placing throw rugs at the top or bottom of stairways, or properly secure the rug with carpet tape to prevent slippage. Get rid of throw rugs, if possible.  Have an electrician put in a light switch at the top and bottom of the stairs. OTHER FALL PREVENTION TIPS  Wear low-heel or rubber-soled shoes that are supportive and fit well. Wear closed toe shoes.  When using a stepladder, make sure it is fully opened and both spreaders are firmly locked. Do not climb a closed stepladder.  Add color or contrast paint or tape to grab bars and handrails in your home. Place contrasting color strips on first and last steps.  Learn and use mobility aids as needed. Install an electrical emergency response system.  Turn on lights to avoid dark areas. Replace light bulbs  that burn out immediately. Get light switches that glow.  Arrange furniture to create clear pathways. Keep furniture in the same place.  Firmly attach carpet with non-skid or double-sided tape.  Eliminate uneven floor surfaces.  Select a carpet pattern that does not visually hide the edge of steps.  Be aware of all pets. OTHER HOME SAFETY TIPS  Set the water temperature for 120 F (48.8 C).  Keep emergency numbers on or near the telephone.  Keep smoke detectors on every level of the home and near sleeping  areas. Document Released: 12/21/2001 Document Revised: 07/02/2011 Document Reviewed: 03/22/2011 [facility] Patient Information 2014 [place].

## 2013-04-12 NOTE — ED Notes (Addendum)
Pt was able to ambulate with two assistance and a walker.  Pt sts that the pain meds helped her feel better.  Pt gait is steady.  Writer notified PA

## 2013-04-12 NOTE — ED Provider Notes (Signed)
Medical screening examination/treatment/procedure(s) were conducted as a shared visit with non-physician practitioner(s) and myself.  I personally evaluated the patient during the encounter.   EKG Interpretation None      Pt  Is a 80 y.o. F angulate with a walker who presents emergency Department mechanical fall today. She reports that she was walking with her walker and was reaching towards the door to open and lost her footing and balance and fell backward striking her head. Denies any chest pain, palpitations, dizziness, shortness of breath that led to her fall. She is complaining of lower back pain and coccyx pain. No loss of consciousness. She is on anticoagulation. No chest pain or abdominal pain. No shortness of breath. No new neurologic deficits. We'll obtain imaging to evaluate for acute injury.  [place], DO 04/12/13 2030

## 2013-04-12 NOTE — ED Notes (Signed)
Walked Pt in and out of her room with a walker. Pt tolerated weight on her legs well

## 2013-04-12 NOTE — Progress Notes (Signed)
1917/12/19 A. Christen Bame RNCM 2129pm [facility] provided patient's daughter with home health orders and face to face to be given to resident RN at patient's facility.  Patient's daughter thankful for services.  No further EDCM needs at this time.

## 2013-04-13 ENCOUNTER — Ambulatory Visit: Payer: Medicare Other | Admitting: Interventional Cardiology

## 2013-04-16 ENCOUNTER — Telehealth: Payer: Self-pay | Admitting: Interventional Cardiology

## 2013-04-16 NOTE — Telephone Encounter (Signed)
New Message:  Pt's daughter called to reschedule her mom's previous Dr. Irish Lack appt. She would like for her mom to be worked in as soon as possible for an upcoming afternoon on Dr. Hassell Done schedule. I offered her an appt on 4/8 but the daughter states it was too early in the morning. She is requesting a call back from the nurse. The soonest PA appt was on 4/15.Marland Kitchen Daughter stated that was too far away.

## 2013-04-16 NOTE — Telephone Encounter (Signed)
Spoke with pt's daughter, she states pt was in the hospital recently and she needs to be seen asap. An appointment was offered  for 04/21/13 in the morning daughter said that it was too early in the morning, pt has a hard time to get up early; daughter  would like to have an appointment same day in the afternoon. Daughter is aware that we can't overbook her without being approved by MD. She is aware that this message will be send to Md and his CMA for recommendations.

## 2013-04-19 ENCOUNTER — Telehealth: Payer: Self-pay | Admitting: Interventional Cardiology

## 2013-04-19 NOTE — Telephone Encounter (Signed)
New message     Patient daughter calling  Need to speak with the CMA/ nurse . Has some questions concerns .    appt is made on  4/8 . Daughter has concerns regarding K+.

## 2013-04-19 NOTE — Telephone Encounter (Signed)
Pt is scheduled for 4.8.15

## 2013-04-19 NOTE — Telephone Encounter (Signed)
Spoke with pts daughter and pt needs an appt this week in the afternoon.  She is willing to see any doctor or PA either here or at the [place] [facility]. Can you please try and get patient an appt this week in the afternoon!

## 2013-04-20 NOTE — Telephone Encounter (Signed)
Spoke with pts daugther and she will try and bring pt for appt tomorrow. She will call if she can not make it.

## 2013-04-21 ENCOUNTER — Ambulatory Visit (INDEPENDENT_AMBULATORY_CARE_PROVIDER_SITE_OTHER): Payer: Medicare Other | Admitting: Interventional Cardiology

## 2013-04-21 ENCOUNTER — Encounter: Payer: Self-pay | Admitting: Interventional Cardiology

## 2013-04-21 VITALS — BP 124/72 | HR 64 | Ht 64.0 in | Wt 152.0 lb

## 2013-04-21 DIAGNOSIS — I10 Essential (primary) hypertension: Secondary | ICD-10-CM | POA: Diagnosis not present

## 2013-04-21 DIAGNOSIS — I491 Atrial premature depolarization: Secondary | ICD-10-CM

## 2013-04-21 DIAGNOSIS — E876 Hypokalemia: Secondary | ICD-10-CM

## 2013-04-21 DIAGNOSIS — I350 Nonrheumatic aortic (valve) stenosis: Secondary | ICD-10-CM

## 2013-04-21 DIAGNOSIS — I359 Nonrheumatic aortic valve disorder, unspecified: Secondary | ICD-10-CM | POA: Diagnosis not present

## 2013-04-21 DIAGNOSIS — R0602 Shortness of breath: Secondary | ICD-10-CM | POA: Diagnosis not present

## 2013-04-21 LAB — BASIC METABOLIC PANEL
BUN: 36 mg/dL — ABNORMAL HIGH (ref 6–23)
CHLORIDE: 102 meq/L (ref 96–112)
CO2: 26 mEq/L (ref 19–32)
CREATININE: 1.8 mg/dL — AB (ref 0.4–1.2)
Calcium: 9.4 mg/dL (ref 8.4–10.5)
GFR: 27.5 mL/min — AB (ref >60.00)
Glucose, Bld: 96 mg/dL (ref 70–99)
Potassium: 3.5 mEq/L (ref 3.5–5.1)
Sodium: 142 mEq/L (ref 135–145)

## 2013-04-21 LAB — BRAIN NATRIURETIC PEPTIDE: PRO B NATRI PEPTIDE: 169 pg/mL — AB (ref 0.0–100.0)

## 2013-04-21 NOTE — Progress Notes (Signed)
Patient ID: Christina Cabrera, female   DOB: 17-Jun-1917, 80 y.o.   MRN: [id number]    Christina Cabrera, Christina Cabrera, Christina Cabrera  [id number] Phone: [phone] Fax:  Cabrera  Date:  04/21/2013   ID:  Christina Cabrera, DOB 1917/08/01, MRN [id number]  PCP:  Christina Pac, MD      History of Present Illness: Christina Cabrera is a 80 y.o. female who has had HTN.  She was admitted for exertional fatigue.  She was found to have symptomatic bradycardia in the hospital.  She had palpitations after the walking.  She had her rate slowing drugs stopped, Bystolic and clonidine.  BP has been ok.  SHe had a fall.  She did not hit her head.  She did not pass out.  She remember the episode. Head CT was negaitve.   Event monitor in 6 13 showed sinus bradycardia, PACs, PVCs.  HR at discharge was 50, but PPM was not indicated.      Wt Readings from Last 3 Encounters:  04/21/13 152 lb (68.947 kg)  04/04/13 153 lb 10.6 oz (69.7 kg)  06/24/12 160 lb (72.576 kg)     Past Medical History  Diagnosis Date  . Hypertension   . Arthritis   . Diverticulitis   . Irritable bowel   . PMR (polymyalgia rheumatica)     a. On chronic prednisone.  Marland Kitchen Spinal stenosis   . Hypothyroidism   . Chronic kidney disease     Stage III/IV  . Hyperlipidemia   . Cataract   . Anxiety   . Osteoporosis   . Symptomatic bradycardia     a. Adm 03/2013 - taken off clonidine and BB.  Marland Kitchen Premature atrial contractions     a. Noted during adm 03/2013, corresponding with patient's palpitations.  . Aortic stenosis     a. Mild AS by echo 03/2013.  Marland Kitchen Hypokalemia     Current Outpatient Prescriptions  Medication Sig Dispense Refill  . ALPRAZolam (XANAX) 0.5 MG tablet Take 0.25 mg by mouth 3 (three) times daily.       Marland Kitchen amLODipine (NORVASC) 2.5 MG tablet Take 2.5 mg by mouth daily.      . Cholecalciferol (VITAMIN D) 2000 UNITS CAPS Take 2,000 Units by mouth daily.      . furosemide (LASIX) 40 MG tablet Take 40 mg by mouth every  morning.       . hydrALAZINE (APRESOLINE) 50 MG tablet Take 1 tablet (50 mg total) by mouth 3 (three) times daily. (Every 8 hours)  90 tablet  2  . HYDROcodone-acetaminophen (NORCO) 10-325 MG per tablet Take 1 tablet by mouth every 6 (six) hours as needed. pain      . Ketotifen Fumarate (ALLERGY EYE DROPS OP) Apply 1 drop to eye 2 (two) times daily.      Marland Kitchen levothyroxine (SYNTHROID, LEVOTHROID) 50 MCG tablet Take 25 mcg by mouth daily. Pt takes 1/2 tab for 82mcg dose      . methylcellulose (CITRUCEL) oral powder Take 1 packet by mouth daily.      Marland Kitchen omeprazole (PRILOSEC) 20 MG capsule Take 20 mg by mouth daily.      . potassium chloride SA (K-DUR,KLOR-CON) 20 MEQ tablet Take 1 tablet (20 mEq total) by mouth 2 (two) times daily.  60 tablet  2  . predniSONE (DELTASONE) 5 MG tablet Take 5 mg by mouth every morning.      . sertraline (ZOLOFT) 25 MG tablet Take 25 mg  by mouth at bedtime.        No current facility-administered medications for this visit.    Allergies:    Allergies  Allergen Reactions  . Quinolones Other (See Comments)    platelets    Social History:  The patient  reports that she has never smoked. She has never used smokeless tobacco. She reports that she does not drink alcohol or use illicit drugs.   Family History:  The patient's family history includes Hyperlipidemia in her father, mother, and sister; Hypertension in her father, mother, and sister. There is no history of Diabetes, Heart attack, or Sudden death.   ROS:  Please see the history of present illness.  No nausea, vomiting.  No fevers, chills.  No focal weakness.  No dysuria.    All other systems reviewed and negative.   PHYSICAL EXAM: VS:  BP 124/72  Pulse 64  Ht 5\' 4"  (1.626 m)  Wt 152 lb (68.947 kg)  BMI 26.08 kg/m2 Well nourished, well developed, in no acute distress HEENT: normal Neck: no JVD, no carotid bruits Cardiac:  normal S1, S2; RRR; systolic murmur Lungs:  clear to auscultation bilaterally, no  wheezing, rhonchi or rales Abd: soft, nontender, no hepatomegaly Ext: no edema Skin: warm and dry Neuro:   no focal abnormalities noted  EKG:   Sinus bradycardia in the hospital    ASSESSMENT AND PLAN:  1. DOE:  This has been getting worse.  Unclear if this is related to diastolic dysfunction or fluid overload.  Will check BNP.   2. Aortic stenosis: Mild by recent echo.  Not causing sx. 3. HTN: BP controlled off of rate slowing meds.  4. Rx given for wheelchair.  5. Hypokalemia: Checking potassium today. She would like future potassium checks to be done with her primary care doctor, Dr. Rex Cabrera. will adjust potassium result today.     Signed, Mina Marble, MD, [facility] 04/21/2013 10:58 AM

## 2013-04-21 NOTE — Patient Instructions (Addendum)
Your physician recommends that you return for lab work today for Bmet and BNP.  Your physician recommends that you schedule a follow-up appointment as needed. Continue regular follow up with Dr. Rex Kras.

## 2013-04-23 DIAGNOSIS — M25559 Pain in unspecified hip: Secondary | ICD-10-CM | POA: Diagnosis not present

## 2013-04-23 DIAGNOSIS — R262 Difficulty in walking, not elsewhere classified: Secondary | ICD-10-CM | POA: Diagnosis not present

## 2013-04-27 DIAGNOSIS — R300 Dysuria: Secondary | ICD-10-CM | POA: Diagnosis not present

## 2013-05-03 DIAGNOSIS — R262 Difficulty in walking, not elsewhere classified: Secondary | ICD-10-CM | POA: Diagnosis not present

## 2013-05-03 DIAGNOSIS — M25559 Pain in unspecified hip: Secondary | ICD-10-CM | POA: Diagnosis not present

## 2013-05-04 ENCOUNTER — Ambulatory Visit: Payer: Medicare Other | Admitting: Physician Assistant

## 2013-05-05 DIAGNOSIS — M25559 Pain in unspecified hip: Secondary | ICD-10-CM | POA: Diagnosis not present

## 2013-05-05 DIAGNOSIS — R262 Difficulty in walking, not elsewhere classified: Secondary | ICD-10-CM | POA: Diagnosis not present

## 2013-05-06 DIAGNOSIS — M25559 Pain in unspecified hip: Secondary | ICD-10-CM | POA: Diagnosis not present

## 2013-05-06 DIAGNOSIS — I10 Essential (primary) hypertension: Secondary | ICD-10-CM | POA: Diagnosis not present

## 2013-05-06 DIAGNOSIS — F411 Generalized anxiety disorder: Secondary | ICD-10-CM | POA: Diagnosis not present

## 2013-05-06 DIAGNOSIS — I498 Other specified cardiac arrhythmias: Secondary | ICD-10-CM | POA: Diagnosis not present

## 2013-05-10 DIAGNOSIS — R262 Difficulty in walking, not elsewhere classified: Secondary | ICD-10-CM | POA: Diagnosis not present

## 2013-05-10 DIAGNOSIS — M25559 Pain in unspecified hip: Secondary | ICD-10-CM | POA: Diagnosis not present

## 2013-05-10 DIAGNOSIS — M5137 Other intervertebral disc degeneration, lumbosacral region: Secondary | ICD-10-CM | POA: Diagnosis not present

## 2013-05-11 DIAGNOSIS — M25559 Pain in unspecified hip: Secondary | ICD-10-CM | POA: Diagnosis not present

## 2013-05-11 DIAGNOSIS — R262 Difficulty in walking, not elsewhere classified: Secondary | ICD-10-CM | POA: Diagnosis not present

## 2013-05-17 DIAGNOSIS — M25559 Pain in unspecified hip: Secondary | ICD-10-CM | POA: Diagnosis not present

## 2013-05-17 DIAGNOSIS — R262 Difficulty in walking, not elsewhere classified: Secondary | ICD-10-CM | POA: Diagnosis not present

## 2013-05-18 ENCOUNTER — Other Ambulatory Visit: Payer: Self-pay | Admitting: Cardiology

## 2013-05-18 ENCOUNTER — Telehealth: Payer: Self-pay | Admitting: Interventional Cardiology

## 2013-05-18 ENCOUNTER — Other Ambulatory Visit (INDEPENDENT_AMBULATORY_CARE_PROVIDER_SITE_OTHER): Payer: Medicare Other

## 2013-05-18 DIAGNOSIS — R0602 Shortness of breath: Secondary | ICD-10-CM

## 2013-05-18 DIAGNOSIS — Z79899 Other long term (current) drug therapy: Secondary | ICD-10-CM

## 2013-05-18 LAB — BASIC METABOLIC PANEL
BUN: 41 mg/dL — ABNORMAL HIGH (ref 6–23)
CO2: 25 mEq/L (ref 19–32)
Calcium: 9.1 mg/dL (ref 8.4–10.5)
Chloride: 105 mEq/L (ref 96–112)
Creatinine, Ser: 1.9 mg/dL — ABNORMAL HIGH (ref 0.4–1.2)
GFR: 25.85 mL/min — AB (ref >60.00)
Glucose, Bld: 102 mg/dL — ABNORMAL HIGH (ref 70–99)
Potassium: 3.6 mEq/L (ref 3.5–5.1)
Sodium: 140 mEq/L (ref 135–145)

## 2013-05-18 LAB — BRAIN NATRIURETIC PEPTIDE: Pro B Natriuretic peptide (BNP): 259 pg/mL — ABNORMAL HIGH (ref 0.0–100.0)

## 2013-05-18 NOTE — Telephone Encounter (Signed)
New Message  Pt daughter called requests a call back to discuss if lab work is needed anytime soon. Please call.

## 2013-05-18 NOTE — Telephone Encounter (Signed)
Spoke with pts daughter and she wants pt to come here for 1 month f/u bnp and bmet. She doesn't want to have labs drawn at Dr. Eddie Dibbles office. Labs ordered. FYI to Dr. Irish Lack.

## 2013-05-20 DIAGNOSIS — M25559 Pain in unspecified hip: Secondary | ICD-10-CM | POA: Diagnosis not present

## 2013-05-20 DIAGNOSIS — R262 Difficulty in walking, not elsewhere classified: Secondary | ICD-10-CM | POA: Diagnosis not present

## 2013-05-25 ENCOUNTER — Other Ambulatory Visit (INDEPENDENT_AMBULATORY_CARE_PROVIDER_SITE_OTHER): Payer: Medicare Other

## 2013-05-25 DIAGNOSIS — M25559 Pain in unspecified hip: Secondary | ICD-10-CM | POA: Diagnosis not present

## 2013-05-25 DIAGNOSIS — Z79899 Other long term (current) drug therapy: Secondary | ICD-10-CM | POA: Diagnosis not present

## 2013-05-25 DIAGNOSIS — E876 Hypokalemia: Secondary | ICD-10-CM

## 2013-05-25 DIAGNOSIS — R262 Difficulty in walking, not elsewhere classified: Secondary | ICD-10-CM | POA: Diagnosis not present

## 2013-05-25 LAB — BASIC METABOLIC PANEL
BUN: 34 mg/dL — ABNORMAL HIGH (ref 6–23)
CALCIUM: 9 mg/dL (ref 8.4–10.5)
CO2: 28 mEq/L (ref 19–32)
Chloride: 104 mEq/L (ref 96–112)
Creatinine, Ser: 1.7 mg/dL — ABNORMAL HIGH (ref 0.4–1.2)
GFR: 29.54 mL/min — AB (ref >60.00)
Glucose, Bld: 79 mg/dL (ref 70–99)
POTASSIUM: 3.6 meq/L (ref 3.5–5.1)
SODIUM: 140 meq/L (ref 135–145)

## 2013-05-27 DIAGNOSIS — R262 Difficulty in walking, not elsewhere classified: Secondary | ICD-10-CM | POA: Diagnosis not present

## 2013-05-27 DIAGNOSIS — M25559 Pain in unspecified hip: Secondary | ICD-10-CM | POA: Diagnosis not present

## 2013-05-28 ENCOUNTER — Telehealth: Payer: Self-pay | Admitting: Interventional Cardiology

## 2013-05-28 DIAGNOSIS — R262 Difficulty in walking, not elsewhere classified: Secondary | ICD-10-CM | POA: Diagnosis not present

## 2013-05-28 DIAGNOSIS — M25559 Pain in unspecified hip: Secondary | ICD-10-CM | POA: Diagnosis not present

## 2013-05-28 NOTE — Telephone Encounter (Signed)
Returned call

## 2013-05-28 NOTE — Telephone Encounter (Signed)
New message ° ° ° ° °Want lab results °

## 2013-07-02 DIAGNOSIS — E039 Hypothyroidism, unspecified: Secondary | ICD-10-CM | POA: Diagnosis not present

## 2013-07-02 DIAGNOSIS — R300 Dysuria: Secondary | ICD-10-CM | POA: Diagnosis not present

## 2013-07-07 DIAGNOSIS — H52209 Unspecified astigmatism, unspecified eye: Secondary | ICD-10-CM | POA: Diagnosis not present

## 2013-07-07 DIAGNOSIS — H3530 Unspecified macular degeneration: Secondary | ICD-10-CM | POA: Diagnosis not present

## 2013-07-07 DIAGNOSIS — Z961 Presence of intraocular lens: Secondary | ICD-10-CM | POA: Diagnosis not present

## 2013-07-07 DIAGNOSIS — H04129 Dry eye syndrome of unspecified lacrimal gland: Secondary | ICD-10-CM | POA: Diagnosis not present

## 2013-07-14 NOTE — Telephone Encounter (Signed)
Please close encounter, Thanks! SR

## 2013-08-02 DIAGNOSIS — R1084 Generalized abdominal pain: Secondary | ICD-10-CM | POA: Diagnosis not present

## 2013-08-18 ENCOUNTER — Other Ambulatory Visit: Payer: Self-pay

## 2013-08-18 ENCOUNTER — Emergency Department (HOSPITAL_COMMUNITY)
Admission: EM | Admit: 2013-08-18 | Discharge: 2013-08-18 | Disposition: A | Discharge status: Home / Self Care | Payer: Medicare Other | Attending: Emergency Medicine | Admitting: Emergency Medicine

## 2013-08-18 ENCOUNTER — Emergency Department (HOSPITAL_COMMUNITY): Payer: Medicare Other

## 2013-08-18 ENCOUNTER — Encounter (HOSPITAL_COMMUNITY): Payer: Self-pay | Admitting: Emergency Medicine

## 2013-08-18 DIAGNOSIS — R5383 Other fatigue: Secondary | ICD-10-CM | POA: Diagnosis not present

## 2013-08-18 DIAGNOSIS — M810 Age-related osteoporosis without current pathological fracture: Secondary | ICD-10-CM | POA: Insufficient documentation

## 2013-08-18 DIAGNOSIS — M129 Arthropathy, unspecified: Secondary | ICD-10-CM | POA: Insufficient documentation

## 2013-08-18 DIAGNOSIS — R109 Unspecified abdominal pain: Secondary | ICD-10-CM | POA: Diagnosis not present

## 2013-08-18 DIAGNOSIS — R5381 Other malaise: Secondary | ICD-10-CM | POA: Insufficient documentation

## 2013-08-18 DIAGNOSIS — R1031 Right lower quadrant pain: Secondary | ICD-10-CM | POA: Insufficient documentation

## 2013-08-18 DIAGNOSIS — Z8639 Personal history of other endocrine, nutritional and metabolic disease: Secondary | ICD-10-CM | POA: Insufficient documentation

## 2013-08-18 DIAGNOSIS — F411 Generalized anxiety disorder: Secondary | ICD-10-CM | POA: Insufficient documentation

## 2013-08-18 DIAGNOSIS — R1032 Left lower quadrant pain: Secondary | ICD-10-CM | POA: Diagnosis not present

## 2013-08-18 DIAGNOSIS — IMO0002 Reserved for concepts with insufficient information to code with codable children: Secondary | ICD-10-CM | POA: Diagnosis not present

## 2013-08-18 DIAGNOSIS — K461 Unspecified abdominal hernia with gangrene: Secondary | ICD-10-CM | POA: Diagnosis not present

## 2013-08-18 DIAGNOSIS — I959 Hypotension, unspecified: Secondary | ICD-10-CM | POA: Diagnosis not present

## 2013-08-18 DIAGNOSIS — M353 Polymyalgia rheumatica: Secondary | ICD-10-CM | POA: Diagnosis not present

## 2013-08-18 DIAGNOSIS — Z90710 Acquired absence of both cervix and uterus: Secondary | ICD-10-CM | POA: Diagnosis not present

## 2013-08-18 DIAGNOSIS — K219 Gastro-esophageal reflux disease without esophagitis: Secondary | ICD-10-CM | POA: Insufficient documentation

## 2013-08-18 DIAGNOSIS — Z862 Personal history of diseases of the blood and blood-forming organs and certain disorders involving the immune mechanism: Secondary | ICD-10-CM | POA: Diagnosis not present

## 2013-08-18 DIAGNOSIS — I129 Hypertensive chronic kidney disease with stage 1 through stage 4 chronic kidney disease, or unspecified chronic kidney disease: Secondary | ICD-10-CM | POA: Insufficient documentation

## 2013-08-18 DIAGNOSIS — Z79899 Other long term (current) drug therapy: Secondary | ICD-10-CM | POA: Diagnosis not present

## 2013-08-18 DIAGNOSIS — N189 Chronic kidney disease, unspecified: Secondary | ICD-10-CM | POA: Diagnosis not present

## 2013-08-18 DIAGNOSIS — I10 Essential (primary) hypertension: Secondary | ICD-10-CM | POA: Diagnosis not present

## 2013-08-18 LAB — HEPATIC FUNCTION PANEL
ALBUMIN: 3.9 g/dL (ref 3.5–5.2)
ALK PHOS: 72 U/L (ref 39–117)
ALT: 13 U/L (ref 0–35)
AST: 25 U/L (ref 0–37)
Bilirubin, Direct: <0.2 mg/dL (ref 0.0–0.3)
TOTAL PROTEIN: 7.1 g/dL (ref 6.0–8.3)
Total Bilirubin: 0.5 mg/dL (ref 0.3–1.2)

## 2013-08-18 LAB — URINALYSIS, ROUTINE W REFLEX MICROSCOPIC
BILIRUBIN URINE: NEGATIVE
GLUCOSE, UA: NEGATIVE mg/dL
HGB URINE DIPSTICK: NEGATIVE
Ketones, ur: NEGATIVE mg/dL
Leukocytes, UA: NEGATIVE
Nitrite: NEGATIVE
Protein, ur: NEGATIVE mg/dL
SPECIFIC GRAVITY, URINE: 1.01 (ref 1.005–1.030)
Urobilinogen, UA: 0.2 mg/dL (ref 0.0–1.0)
pH: 7.5 (ref 5.0–8.0)

## 2013-08-18 LAB — CBC
HEMATOCRIT: 39.1 % (ref 36.0–46.0)
HEMOGLOBIN: 12.8 g/dL (ref 12.0–15.0)
MCH: 27.4 pg (ref 26.0–34.0)
MCHC: 32.7 g/dL (ref 30.0–36.0)
MCV: 83.5 fL (ref 78.0–100.0)
Platelets: 186 10*3/uL (ref 150–400)
RBC: 4.68 MIL/uL (ref 3.87–5.11)
RDW: 13.8 % (ref 11.5–15.5)
WBC: 6.5 10*3/uL (ref 4.0–10.5)

## 2013-08-18 LAB — BASIC METABOLIC PANEL
Anion gap: 17 — ABNORMAL HIGH (ref 5–15)
BUN: 35 mg/dL — ABNORMAL HIGH (ref 6–23)
CALCIUM: 9.9 mg/dL (ref 8.4–10.5)
CO2: 23 meq/L (ref 19–32)
Chloride: 101 mEq/L (ref 96–112)
Creatinine, Ser: 1.65 mg/dL — ABNORMAL HIGH (ref 0.50–1.10)
GFR calc Af Amer: 30 mL/min — ABNORMAL LOW (ref >90)
GFR calc non Af Amer: 25 mL/min — ABNORMAL LOW (ref >90)
GLUCOSE: 112 mg/dL — AB (ref 70–99)
Potassium: 3.6 mEq/L — ABNORMAL LOW (ref 3.7–5.3)
SODIUM: 141 meq/L (ref 137–147)

## 2013-08-18 LAB — I-STAT CHEM 8, ED
BUN: 32 mg/dL — AB (ref 6–23)
Calcium, Ion: 1.05 mmol/L — ABNORMAL LOW (ref 1.13–1.30)
Chloride: 106 mEq/L (ref 96–112)
Creatinine, Ser: 1.6 mg/dL — ABNORMAL HIGH (ref 0.50–1.10)
Glucose, Bld: 108 mg/dL — ABNORMAL HIGH (ref 70–99)
HCT: 39 % (ref 36.0–46.0)
Hemoglobin: 13.3 g/dL (ref 12.0–15.0)
POTASSIUM: 3.5 meq/L — AB (ref 3.7–5.3)
Sodium: 139 mEq/L (ref 137–147)
TCO2: 22 mmol/L (ref 0–100)

## 2013-08-18 LAB — I-STAT TROPONIN, ED: Troponin i, poc: 0.01 ng/mL (ref 0.00–0.08)

## 2013-08-18 LAB — CBG MONITORING, ED: Glucose-Capillary: 105 mg/dL — ABNORMAL HIGH (ref 70–99)

## 2013-08-18 LAB — LIPASE, BLOOD: Lipase: 29 U/L (ref 11–59)

## 2013-08-18 LAB — I-STAT CG4 LACTIC ACID, ED: Lactic Acid, Venous: 2.71 mmol/L — ABNORMAL HIGH (ref 0.5–2.2)

## 2013-08-18 MED ORDER — IOHEXOL 300 MG/ML  SOLN
50.0000 mL | Freq: Once | INTRAMUSCULAR | Status: AC | PRN
  Administered 2013-08-18: 50 mL via ORAL

## 2013-08-18 MED ORDER — OXYCODONE-ACETAMINOPHEN 5-325 MG PO TABS
1.0000 | ORAL_TABLET | Freq: Four times a day (QID) | ORAL | Status: DC | PRN
Start: 2013-08-18 — End: 2014-12-17

## 2013-08-18 MED ORDER — SODIUM CHLORIDE 0.9 % IV BOLUS (SEPSIS)
500.0000 mL | Freq: Once | INTRAVENOUS | Status: AC
  Administered 2013-08-18: 500 mL via INTRAVENOUS

## 2013-08-18 NOTE — Discharge Instructions (Signed)
Your GI specialist for further evaluation of your abdominal pain. Call for a follow up appointment with a Family or Primary Care Provider.  Return if Symptoms worsen.   Take medication as prescribed.

## 2013-08-18 NOTE — ED Notes (Signed)
PA at bedside.

## 2013-08-18 NOTE — ED Provider Notes (Signed)
CSN: [id number]     Arrival date & time 08/18/13  1303 History   First MD Initiated Contact with Patient 08/18/13 1359     Chief Complaint  Patient presents with  . Hypotension  . Weakness     (Consider location/radiation/quality/duration/timing/severity/associated sxs/prior Treatment) HPI Christina Cabrera is a 80 y.o. female who presents emergency department complaining of generalized weakness, abdominal pain. Patient with long-standing history of abdominal pain, followed by Dr. Oletta Lamas with gastroenterology Dr. Rex Kras primary care Dr., with multiple studies done but no exact diagnosis. Patient states yesterday she had a "attack of my colitis." Patient states that she gets these intermittent attacks, most of the time after having a bowel movement, which resolved within a few hours, however they're usually followed by "abdominal soreness and weakness." Patient states she had one of these attacks last night, and now she is feeling weak, and continues to have abdominal pain. She went to her primary care Dr. today, seen there, was found to have a blood pressure of 90 systolic, was found to be generally weak, sent here for further evaluation. Patient reports diffuse abdominal pain. States she had associated nausea but suggests is most likely because of her GERD. She denies any vomiting. She denies any changes in her bowels. She denies any fever or chills. There are no urinary symptoms. She admits to poor appetite. She denies any other complaints. She states "I'm just weak cannot think of dying."  Past Medical History  Diagnosis Date  . Hypertension   . Arthritis   . Diverticulitis   . Irritable bowel   . PMR (polymyalgia rheumatica)     a. On chronic prednisone.  Marland Kitchen Spinal stenosis   . Hypothyroidism   . Chronic kidney disease     Stage III/IV  . Hyperlipidemia   . Cataract   . Anxiety   . Osteoporosis   . Symptomatic bradycardia     a. Adm 03/2013 - taken off clonidine and BB.  Marland Kitchen Premature  atrial contractions     a. Noted during adm 03/2013, corresponding with patient's palpitations.  . Aortic stenosis     a. Mild AS by echo 03/2013.  Marland Kitchen Hypokalemia    Past Surgical History  Procedure Laterality Date  . Back surgery    . Appendectomy    . Cholecystectomy    . Eye surgery    . Abdominal hysterectomy     Family History  Problem Relation Age of Onset  . Hyperlipidemia Mother   . Hypertension Mother   . Hypertension Father   . Hyperlipidemia Father   . Hyperlipidemia Sister   . Hypertension Sister   . Diabetes Neg Hx   . Heart attack Neg Hx   . Sudden death Neg Hx    History  Substance Use Topics  . Smoking status: Never Smoker   . Smokeless tobacco: Never Used  . Alcohol Use: No   OB History   Grav Para Term Preterm Abortions TAB SAB Ect Mult Living                 Review of Systems  Constitutional: Positive for fatigue. Negative for fever and chills.  Respiratory: Negative for cough, chest tightness and shortness of breath.   Cardiovascular: Negative for chest pain, palpitations and leg swelling.  Gastrointestinal: Positive for nausea and abdominal pain. Negative for vomiting and diarrhea.  Genitourinary: Negative for dysuria, hematuria, flank pain, vaginal bleeding, vaginal discharge, vaginal pain and pelvic pain.  Musculoskeletal: Negative for arthralgias, myalgias,  neck pain and neck stiffness.  Skin: Negative for rash.  Neurological: Positive for weakness. Negative for dizziness and headaches.  All other systems reviewed and are negative.     Allergies  Codeine; Fosamax; Miacalcin; Nsaids; Quinolones; and Sulfa antibiotics  Home Medications   Prior to Admission medications   Medication Sig Start Date End Date Taking? Authorizing Provider  ALPRAZolam Duanne Moron) 0.5 MG tablet Take 0.25 mg by mouth 4 (four) times daily.    Yes Historical Provider, MD  amLODipine (NORVASC) 5 MG tablet Take 5 mg by mouth daily.   Yes Historical Provider, MD   Cholecalciferol (VITAMIN D) 2000 UNITS CAPS Take 2,000 Units by mouth daily.   Yes Historical Provider, MD  furosemide (LASIX) 40 MG tablet Take 40 mg by mouth every morning.    Yes Historical Provider, MD  hydrALAZINE (APRESOLINE) 50 MG tablet Take 1 tablet (50 mg total) by mouth 3 (three) times daily. (Every 8 hours) 04/04/13  Yes Dayna N Dunn, PA-C  HYDROcodone-acetaminophen (NORCO) 10-325 MG per tablet Take 1 tablet by mouth every 6 (six) hours as needed. pain 07/13/11  Yes Adeline C Viyuoh, MD  Ketotifen Fumarate (ALLERGY EYE DROPS OP) Apply 1 drop to eye 2 (two) times daily.   Yes Historical Provider, MD  levothyroxine (SYNTHROID, LEVOTHROID) 50 MCG tablet Take 50 mcg by mouth daily. Pt takes 1/2 tab for 11mcg dose   Yes Historical Provider, MD  omeprazole (PRILOSEC) 20 MG capsule Take 20 mg by mouth daily.   Yes Historical Provider, MD  polyethylene glycol (MIRALAX / GLYCOLAX) packet Take 17 g by mouth at bedtime.   Yes Historical Provider, MD  potassium chloride SA (K-DUR,KLOR-CON) 20 MEQ tablet Take 1 tablet (20 mEq total) by mouth 2 (two) times daily. 04/04/13  Yes Dayna N Dunn, PA-C  predniSONE (DELTASONE) 5 MG tablet Take 5 mg by mouth every morning.   Yes Historical Provider, MD  sertraline (ZOLOFT) 25 MG tablet Take 25 mg by mouth at bedtime.    Yes Historical Provider, MD   BP 156/67  Pulse 65  Temp(Src) 97.6 F (36.4 C) (Oral)  Resp 14  SpO2 89% Physical Exam  Nursing note and vitals reviewed. Constitutional: She is oriented to person, place, and time. She appears well-developed and well-nourished. No distress.  HENT:  Head: Normocephalic.  Eyes: Conjunctivae are normal.  Neck: Neck supple.  Cardiovascular: Normal rate, regular rhythm and normal heart sounds.   Pulmonary/Chest: Effort normal and breath sounds normal. No respiratory distress. She has no wheezes. She has no rales.  Abdominal: Soft. Bowel sounds are normal. She exhibits no distension. There is tenderness. There  is no rebound and no guarding.  RLQ and LLQ tenderness  Musculoskeletal: She exhibits no edema.  Neurological: She is alert and oriented to person, place, and time.  Skin: Skin is warm and dry.  Psychiatric: She has a normal mood and affect. Her behavior is normal.    ED Course  Procedures (including critical care time) Labs Review Labs Reviewed  BASIC METABOLIC PANEL - Abnormal; Notable for the following:    Potassium 3.6 (*)    Glucose, Bld 112 (*)    BUN 35 (*)    Creatinine, Ser 1.65 (*)    GFR calc non Af Amer 25 (*)    GFR calc Af Amer 30 (*)    Anion gap 17 (*)    All other components within normal limits  CBG MONITORING, ED - Abnormal; Notable for the following:  Glucose-Capillary 105 (*)    All other components within normal limits  I-STAT CHEM 8, ED - Abnormal; Notable for the following:    Potassium 3.5 (*)    BUN 32 (*)    Creatinine, Ser 1.60 (*)    Glucose, Bld 108 (*)    Calcium, Ion 1.05 (*)    All other components within normal limits  CBC  HEPATIC FUNCTION PANEL  LIPASE, BLOOD  URINALYSIS, ROUTINE W REFLEX MICROSCOPIC  I-STAT CG4 LACTIC ACID, ED  I-STAT TROPOININ, ED    Imaging Review No results found.   EKG Interpretation None      MDM   Final diagnoses:  None   Pt with abdominal pain  Yesterday, weakness since. Prior work up by GI and PCP, all unremarkable. Hypotensive in PCP's office, here normal/hypertensive. Will get labs and CT abd pelvis.  Pt signed out at shfit change pending CT.     Renold Genta, PA-C 08/19/13 2007

## 2013-08-18 NOTE — ED Notes (Signed)
Pt states that yesterday morning she had "an attack of colitis".  States that after so many hours, her pain did not go away.  She saw a gastroenterologist 2 wks ago.  States that her pain continued into last night and this morning pt is dizzy but pain has gotten better.  Denies rectal bleeding, denies NVD.

## 2013-08-18 NOTE — ED Provider Notes (Signed)
  Face-to-face evaluation   History: She has daily abdominal pain, for many months. She has been told that she has bowel ischemia causing her pain. She takes Norco 4 times a day. She feels that the Norco is not helping.  Physical exam: Alert, elderly, female, in mild pain. Abdomen normal bowel sounds, soft. Mild, diffuse tenderness     Date: 08/18/13- MUSE hyperlink inactive  Rate: 58  Rhythm: indeterminate  QRS Axis: left  PR and QT Intervals: QT prolonged  ST/T Wave abnormalities: normal  PR and QRS Conduction Disutrbances:short PR  Narrative Interpretation:   Old EKG Reviewed: none available   Medical screening examination/treatment/procedure(s) were conducted as a shared visit with non-physician practitioner(s) and myself.  I personally evaluated the patient during the encounter   Richarda Blade, MD 08/20/13 2114

## 2013-08-18 NOTE — ED Provider Notes (Signed)
Pt care assumed at shift change from [place], [place]. The patient is a 80 year old female presents emergency room chief complaint of abdominal pain no vomiting, diarrhea, constipation. Also had a hypotensive BP reading, all BP readings have been normotensive in the ED. Followed by Dr. Oletta Lamas, gastroenterology. Plan to followup on CT likely discharge home if no acute findings on CT. Previous workup with labs at baseline.  1740: Pt resting comfortably in room.  Denies abdominal pain at this time.Discussed CT results and plan with the patient and pt's daughter at length.  Advised to follow up with PCP and Dr. Oletta Lamas.  Discussed lab results, imaging results, and treatment plan with the patient. Return precautions given. Reports understanding and no other concerns at this time.  Patient is stable for discharge at this time.  Results for orders placed during the hospital encounter of 08/18/13  CBC      Result Value Ref Range   WBC 6.5  4.0 - 10.5 K/uL   RBC 4.68  3.87 - 5.11 MIL/uL   Hemoglobin 12.8  12.0 - 15.0 g/dL   HCT 39.1  36.0 - 46.0 %   MCV 83.5  78.0 - 100.0 fL   MCH 27.4  26.0 - 34.0 pg   MCHC 32.7  30.0 - 36.0 g/dL   RDW 13.8  11.5 - 15.5 %   Platelets 186  150 - 400 K/uL  BASIC METABOLIC PANEL      Result Value Ref Range   Sodium 141  137 - 147 mEq/L   Potassium 3.6 (*) 3.7 - 5.3 mEq/L   Chloride 101  96 - 112 mEq/L   CO2 23  19 - 32 mEq/L   Glucose, Bld 112 (*) 70 - 99 mg/dL   BUN 35 (*) 6 - 23 mg/dL   Creatinine, Ser 1.65 (*) 0.50 - 1.10 mg/dL   Calcium 9.9  8.4 - 10.5 mg/dL   GFR calc non Af Amer 25 (*) >90 mL/min   GFR calc Af Amer 30 (*) >90 mL/min   Anion gap 17 (*) 5 - 15  HEPATIC FUNCTION PANEL      Result Value Ref Range   Total Protein 7.1  6.0 - 8.3 g/dL   Albumin 3.9  3.5 - 5.2 g/dL   AST 25  0 - 37 U/L   ALT 13  0 - 35 U/L   Alkaline Phosphatase 72  39 - 117 U/L   Total Bilirubin 0.5  0.3 - 1.2 mg/dL   Bilirubin, Direct <0.2  0.0 - 0.3 mg/dL   Indirect  Bilirubin NOT CALCULATED  0.3 - 0.9 mg/dL  LIPASE, BLOOD      Result Value Ref Range   Lipase 29  11 - 59 U/L  URINALYSIS, ROUTINE W REFLEX MICROSCOPIC      Result Value Ref Range   Color, Urine YELLOW  YELLOW   APPearance CLEAR  CLEAR   Specific Gravity, Urine 1.010  1.005 - 1.030   pH 7.5  5.0 - 8.0   Glucose, UA NEGATIVE  NEGATIVE mg/dL   Hgb urine dipstick NEGATIVE  NEGATIVE   Bilirubin Urine NEGATIVE  NEGATIVE   Ketones, ur NEGATIVE  NEGATIVE mg/dL   Protein, ur NEGATIVE  NEGATIVE mg/dL   Urobilinogen, UA 0.2  0.0 - 1.0 mg/dL   Nitrite NEGATIVE  NEGATIVE   Leukocytes, UA NEGATIVE  NEGATIVE  CBG MONITORING, ED      Result Value Ref Range   Glucose-Capillary 105 (*) 70 - 99 mg/dL  I-STAT CHEM 8, ED      Result Value Ref Range   Sodium 139  137 - 147 mEq/L   Potassium 3.5 (*) 3.7 - 5.3 mEq/L   Chloride 106  96 - 112 mEq/L   BUN 32 (*) 6 - 23 mg/dL   Creatinine, Ser 1.60 (*) 0.50 - 1.10 mg/dL   Glucose, Bld 108 (*) 70 - 99 mg/dL   Calcium, Ion 1.05 (*) 1.13 - 1.30 mmol/L   TCO2 22  0 - 100 mmol/L   Hemoglobin 13.3  12.0 - 15.0 g/dL   HCT 39.0  36.0 - 46.0 %  I-STAT CG4 LACTIC ACID, ED      Result Value Ref Range   Lactic Acid, Venous 2.71 (*) 0.5 - 2.2 mmol/L  I-STAT TROPOININ, ED      Result Value Ref Range   Troponin i, poc 0.01  0.00 - 0.08 ng/mL   Comment 3            Ct Abdomen Pelvis Wo Contrast  08/18/2013   CLINICAL DATA:  Abdominal pain.  Weakness.  EXAM: CT ABDOMEN AND PELVIS WITHOUT CONTRAST  TECHNIQUE: Multidetector CT imaging of the abdomen and pelvis was performed following the standard protocol without IV contrast.  COMPARISON:  07/11/2011  FINDINGS: Small bilateral pleural effusions, similar to prior study. There is a large hiatal hernia, stable. Heart is borderline in size. Minimal dependent and bibasilar atelectasis.  Prior cholecystectomy. Liver, spleen, pancreas, adrenals and kidneys have an unremarkable unenhanced appearance. Sigmoid diverticulosis. No  active diverticulitis. Stomach, large and small bowel are unremarkable.  Small umbilical hernia containing fat. Urinary bladder is grossly unremarkable. Prior cholecystectomy. No adnexal masses. No free fluid, free air or adenopathy.  Aorta is calcified and tortuous, non aneurysmal.  Probable old healing left sacral insufficiency fracture. Area of sclerosis within the left side of the sacrum. Leftward scoliosis in the lumbar spine. Diffuse degenerative disc disease throughout the visualized thoracolumbar spine. Facet disease with grade 1 anterolisthesis of L5 on S1.  IMPRESSION: Small bilateral pleural effusions, similar to 2013 study.  Sigmoid diverticulosis.  No active diverticulitis.  Stable large hiatal hernia.  No acute findings in the abdomen or pelvis.  Old healing left sacral insufficiency fracture.   Electronically Signed   By: Rolm Baptise M.D.   On: 08/18/2013 16:52    Meds given in ED:  Medications  sodium chloride 0.9 % bolus 500 mL (500 mLs Intravenous New Bag/Given 08/18/13 1428)  iohexol (OMNIPAQUE) 300 MG/ML solution 50 mL (50 mLs Oral Contrast Given 08/18/13 1439)    Discharge Medication List as of 08/18/2013  5:37 PM    START taking these medications   Details  oxyCODONE-acetaminophen (PERCOCET) 5-325 MG per tablet Take 1 tablet by mouth every 6 (six) hours as needed., Starting 08/18/2013, Until Discontinued, Print            Harvie Heck, PA-C 08/19/13 1315

## 2013-08-25 NOTE — ED Provider Notes (Signed)
Medical screening examination/treatment/procedure(s) were performed by non-physician practitioner and as supervising physician I was immediately available for consultation/collaboration.   EKG Interpretation   Date/Time:  Wednesday August 18 2013 13:20:10 EDT Ventricular Rate:  58 PR Interval:  66 QRS Duration: 92 QT Interval:  505 QTC Calculation: 496 R Axis:   -54 Text Interpretation:  Ectopic atrial rhythm Short PR interval Left axis  deviation Low voltage, extremity leads Nonspecific T abnormalities,  lateral leads Borderline prolonged QT interval ED PHYSICIAN INTERPRETATION  AVAILABLE IN [facility] Confirmed by TEST, Record ([id number]) on 08/20/2013  7:18:12 AM      Rolland Porter, MD, Abram Sander   Janice Norrie, MD 08/25/13 1459

## 2013-09-01 DIAGNOSIS — R3989 Other symptoms and signs involving the genitourinary system: Secondary | ICD-10-CM | POA: Diagnosis not present

## 2013-11-01 DIAGNOSIS — Z23 Encounter for immunization: Secondary | ICD-10-CM | POA: Diagnosis not present

## 2013-12-28 DIAGNOSIS — M545 Low back pain: Secondary | ICD-10-CM | POA: Diagnosis not present

## 2013-12-28 DIAGNOSIS — I10 Essential (primary) hypertension: Secondary | ICD-10-CM | POA: Diagnosis not present

## 2013-12-28 DIAGNOSIS — F419 Anxiety disorder, unspecified: Secondary | ICD-10-CM | POA: Diagnosis not present

## 2013-12-28 DIAGNOSIS — M353 Polymyalgia rheumatica: Secondary | ICD-10-CM | POA: Diagnosis not present

## 2013-12-28 DIAGNOSIS — E559 Vitamin D deficiency, unspecified: Secondary | ICD-10-CM | POA: Diagnosis not present

## 2013-12-28 DIAGNOSIS — N184 Chronic kidney disease, stage 4 (severe): Secondary | ICD-10-CM | POA: Diagnosis not present

## 2013-12-28 DIAGNOSIS — K558 Other vascular disorders of intestine: Secondary | ICD-10-CM | POA: Diagnosis not present

## 2013-12-28 DIAGNOSIS — K5289 Other specified noninfective gastroenteritis and colitis: Secondary | ICD-10-CM | POA: Diagnosis not present

## 2013-12-28 DIAGNOSIS — E782 Mixed hyperlipidemia: Secondary | ICD-10-CM | POA: Diagnosis not present

## 2013-12-31 DIAGNOSIS — I10 Essential (primary) hypertension: Secondary | ICD-10-CM | POA: Diagnosis not present

## 2013-12-31 DIAGNOSIS — E782 Mixed hyperlipidemia: Secondary | ICD-10-CM | POA: Diagnosis not present

## 2013-12-31 DIAGNOSIS — M545 Low back pain: Secondary | ICD-10-CM | POA: Diagnosis not present

## 2013-12-31 DIAGNOSIS — N184 Chronic kidney disease, stage 4 (severe): Secondary | ICD-10-CM | POA: Diagnosis not present

## 2013-12-31 DIAGNOSIS — R001 Bradycardia, unspecified: Secondary | ICD-10-CM | POA: Diagnosis not present

## 2013-12-31 DIAGNOSIS — Z23 Encounter for immunization: Secondary | ICD-10-CM | POA: Diagnosis not present

## 2013-12-31 DIAGNOSIS — E559 Vitamin D deficiency, unspecified: Secondary | ICD-10-CM | POA: Diagnosis not present

## 2013-12-31 DIAGNOSIS — M353 Polymyalgia rheumatica: Secondary | ICD-10-CM | POA: Diagnosis not present

## 2013-12-31 DIAGNOSIS — Z0000 Encounter for general adult medical examination without abnormal findings: Secondary | ICD-10-CM | POA: Diagnosis not present

## 2014-06-20 DIAGNOSIS — H6980 Other specified disorders of Eustachian tube, unspecified ear: Secondary | ICD-10-CM | POA: Diagnosis not present

## 2014-07-13 DIAGNOSIS — E782 Mixed hyperlipidemia: Secondary | ICD-10-CM | POA: Diagnosis not present

## 2014-07-13 DIAGNOSIS — F419 Anxiety disorder, unspecified: Secondary | ICD-10-CM | POA: Diagnosis not present

## 2014-07-13 DIAGNOSIS — I10 Essential (primary) hypertension: Secondary | ICD-10-CM | POA: Diagnosis not present

## 2014-07-13 DIAGNOSIS — E559 Vitamin D deficiency, unspecified: Secondary | ICD-10-CM | POA: Diagnosis not present

## 2014-07-13 DIAGNOSIS — N184 Chronic kidney disease, stage 4 (severe): Secondary | ICD-10-CM | POA: Diagnosis not present

## 2014-07-13 DIAGNOSIS — R300 Dysuria: Secondary | ICD-10-CM | POA: Diagnosis not present

## 2014-07-13 DIAGNOSIS — M353 Polymyalgia rheumatica: Secondary | ICD-10-CM | POA: Diagnosis not present

## 2014-07-14 DIAGNOSIS — Z0100 Encounter for examination of eyes and vision without abnormal findings: Secondary | ICD-10-CM | POA: Diagnosis not present

## 2014-07-14 DIAGNOSIS — H5315 Visual distortions of shape and size: Secondary | ICD-10-CM | POA: Diagnosis not present

## 2014-07-14 DIAGNOSIS — H04123 Dry eye syndrome of bilateral lacrimal glands: Secondary | ICD-10-CM | POA: Diagnosis not present

## 2014-07-14 DIAGNOSIS — H3531 Nonexudative age-related macular degeneration: Secondary | ICD-10-CM | POA: Diagnosis not present

## 2014-07-28 DIAGNOSIS — H43813 Vitreous degeneration, bilateral: Secondary | ICD-10-CM | POA: Diagnosis not present

## 2014-07-28 DIAGNOSIS — H3531 Nonexudative age-related macular degeneration: Secondary | ICD-10-CM | POA: Diagnosis not present

## 2014-08-15 DIAGNOSIS — Z85828 Personal history of other malignant neoplasm of skin: Secondary | ICD-10-CM | POA: Diagnosis not present

## 2014-08-15 DIAGNOSIS — L570 Actinic keratosis: Secondary | ICD-10-CM | POA: Diagnosis not present

## 2014-09-07 DIAGNOSIS — R002 Palpitations: Secondary | ICD-10-CM | POA: Diagnosis not present

## 2014-09-07 DIAGNOSIS — F419 Anxiety disorder, unspecified: Secondary | ICD-10-CM | POA: Diagnosis not present

## 2014-09-07 DIAGNOSIS — I10 Essential (primary) hypertension: Secondary | ICD-10-CM | POA: Diagnosis not present

## 2014-10-13 DIAGNOSIS — Z23 Encounter for immunization: Secondary | ICD-10-CM | POA: Diagnosis not present

## 2014-11-03 DIAGNOSIS — H353122 Nonexudative age-related macular degeneration, left eye, intermediate dry stage: Secondary | ICD-10-CM | POA: Diagnosis not present

## 2014-12-16 ENCOUNTER — Inpatient Hospital Stay (HOSPITAL_COMMUNITY)
Admission: EM | Admit: 2014-12-16 | Discharge: 2014-12-21 | DRG: 872 | Disposition: A | Discharge status: Skilled Nursing Facility | Payer: Medicare Other | Attending: Internal Medicine | Admitting: Internal Medicine

## 2014-12-16 ENCOUNTER — Emergency Department (HOSPITAL_COMMUNITY): Payer: Medicare Other

## 2014-12-16 ENCOUNTER — Encounter (HOSPITAL_COMMUNITY): Payer: Self-pay | Admitting: Emergency Medicine

## 2014-12-16 DIAGNOSIS — E860 Dehydration: Secondary | ICD-10-CM | POA: Diagnosis present

## 2014-12-16 DIAGNOSIS — Z7952 Long term (current) use of systemic steroids: Secondary | ICD-10-CM

## 2014-12-16 DIAGNOSIS — A047 Enterocolitis due to Clostridium difficile: Secondary | ICD-10-CM | POA: Diagnosis not present

## 2014-12-16 DIAGNOSIS — N179 Acute kidney failure, unspecified: Secondary | ICD-10-CM | POA: Diagnosis not present

## 2014-12-16 DIAGNOSIS — N184 Chronic kidney disease, stage 4 (severe): Secondary | ICD-10-CM | POA: Diagnosis not present

## 2014-12-16 DIAGNOSIS — K2970 Gastritis, unspecified, without bleeding: Secondary | ICD-10-CM | POA: Diagnosis not present

## 2014-12-16 DIAGNOSIS — R1110 Vomiting, unspecified: Secondary | ICD-10-CM | POA: Diagnosis not present

## 2014-12-16 DIAGNOSIS — R05 Cough: Secondary | ICD-10-CM | POA: Diagnosis not present

## 2014-12-16 DIAGNOSIS — F1920 Other psychoactive substance dependence, uncomplicated: Secondary | ICD-10-CM | POA: Diagnosis present

## 2014-12-16 DIAGNOSIS — R6520 Severe sepsis without septic shock: Secondary | ICD-10-CM | POA: Diagnosis not present

## 2014-12-16 DIAGNOSIS — K529 Noninfective gastroenteritis and colitis, unspecified: Secondary | ICD-10-CM | POA: Diagnosis present

## 2014-12-16 DIAGNOSIS — M353 Polymyalgia rheumatica: Secondary | ICD-10-CM | POA: Diagnosis present

## 2014-12-16 DIAGNOSIS — R112 Nausea with vomiting, unspecified: Secondary | ICD-10-CM | POA: Diagnosis not present

## 2014-12-16 DIAGNOSIS — E876 Hypokalemia: Secondary | ICD-10-CM | POA: Diagnosis present

## 2014-12-16 DIAGNOSIS — F419 Anxiety disorder, unspecified: Secondary | ICD-10-CM | POA: Diagnosis present

## 2014-12-16 DIAGNOSIS — Z221 Carrier of other intestinal infectious diseases: Secondary | ICD-10-CM

## 2014-12-16 DIAGNOSIS — I129 Hypertensive chronic kidney disease with stage 1 through stage 4 chronic kidney disease, or unspecified chronic kidney disease: Secondary | ICD-10-CM | POA: Diagnosis present

## 2014-12-16 DIAGNOSIS — M810 Age-related osteoporosis without current pathological fracture: Secondary | ICD-10-CM | POA: Diagnosis present

## 2014-12-16 DIAGNOSIS — R600 Localized edema: Secondary | ICD-10-CM | POA: Diagnosis present

## 2014-12-16 DIAGNOSIS — N189 Chronic kidney disease, unspecified: Secondary | ICD-10-CM | POA: Diagnosis present

## 2014-12-16 DIAGNOSIS — A419 Sepsis, unspecified organism: Secondary | ICD-10-CM | POA: Diagnosis present

## 2014-12-16 DIAGNOSIS — Z66 Do not resuscitate: Secondary | ICD-10-CM | POA: Diagnosis present

## 2014-12-16 DIAGNOSIS — E039 Hypothyroidism, unspecified: Secondary | ICD-10-CM | POA: Diagnosis present

## 2014-12-16 DIAGNOSIS — R1111 Vomiting without nausea: Secondary | ICD-10-CM | POA: Diagnosis not present

## 2014-12-16 LAB — I-STAT TROPONIN, ED: Troponin i, poc: 0.01 ng/mL (ref 0.00–0.08)

## 2014-12-16 LAB — CBC WITH DIFFERENTIAL/PLATELET
BASOS ABS: 0 10*3/uL (ref 0.0–0.1)
BASOS PCT: 0 %
Eosinophils Absolute: 0 10*3/uL (ref 0.0–0.7)
Eosinophils Relative: 0 %
HEMATOCRIT: 40.1 % (ref 36.0–46.0)
Hemoglobin: 13.3 g/dL (ref 12.0–15.0)
Lymphocytes Relative: 11 %
Lymphs Abs: 0.8 10*3/uL (ref 0.7–4.0)
MCH: 28.6 pg (ref 26.0–34.0)
MCHC: 33.2 g/dL (ref 30.0–36.0)
MCV: 86.2 fL (ref 78.0–100.0)
MONO ABS: 0.6 10*3/uL (ref 0.1–1.0)
Monocytes Relative: 8 %
NEUTROS ABS: 6 10*3/uL (ref 1.7–7.7)
NEUTROS PCT: 81 %
Platelets: 144 10*3/uL — ABNORMAL LOW (ref 150–400)
RBC: 4.65 MIL/uL (ref 3.87–5.11)
RDW: 14.5 % (ref 11.5–15.5)
WBC: 7.3 10*3/uL (ref 4.0–10.5)

## 2014-12-16 LAB — COMPREHENSIVE METABOLIC PANEL
ALK PHOS: 59 U/L (ref 38–126)
ALT: 13 U/L — ABNORMAL LOW (ref 14–54)
ANION GAP: 14 (ref 5–15)
AST: 28 U/L (ref 15–41)
Albumin: 3.6 g/dL (ref 3.5–5.0)
BILIRUBIN TOTAL: 0.8 mg/dL (ref 0.3–1.2)
BUN: 39 mg/dL — ABNORMAL HIGH (ref 6–20)
CALCIUM: 8.8 mg/dL — AB (ref 8.9–10.3)
CO2: 22 mmol/L (ref 22–32)
Chloride: 106 mmol/L (ref 101–111)
Creatinine, Ser: 1.6 mg/dL — ABNORMAL HIGH (ref 0.44–1.00)
GFR, EST AFRICAN AMERICAN: 30 mL/min — AB (ref >60)
GFR, EST NON AFRICAN AMERICAN: 26 mL/min — AB (ref >60)
GLUCOSE: 128 mg/dL — AB (ref 65–99)
Potassium: 3.3 mmol/L — ABNORMAL LOW (ref 3.5–5.1)
Sodium: 142 mmol/L (ref 135–145)
TOTAL PROTEIN: 6 g/dL — AB (ref 6.5–8.1)

## 2014-12-16 LAB — I-STAT CG4 LACTIC ACID, ED: Lactic Acid, Venous: 3.42 mmol/L (ref 0.5–2.0)

## 2014-12-16 LAB — LIPASE, BLOOD: LIPASE: 34 U/L (ref 11–51)

## 2014-12-16 MED ORDER — METOCLOPRAMIDE HCL 5 MG/ML IJ SOLN
5.0000 mg | Freq: Once | INTRAMUSCULAR | Status: DC
  Filled 2014-12-16: qty 2

## 2014-12-16 MED ORDER — METOCLOPRAMIDE HCL 5 MG/ML IJ SOLN
5.0000 mg | Freq: Once | INTRAMUSCULAR | Status: AC
  Administered 2014-12-16: 5 mg via INTRAVENOUS
  Filled 2014-12-16: qty 2

## 2014-12-16 MED ORDER — PIPERACILLIN-TAZOBACTAM 3.375 G IVPB 30 MIN
3.3750 g | Freq: Once | INTRAVENOUS | Status: AC
  Administered 2014-12-16: 3.375 g via INTRAVENOUS
  Filled 2014-12-16: qty 50

## 2014-12-16 MED ORDER — SODIUM CHLORIDE 0.9 % IV BOLUS (SEPSIS)
1000.0000 mL | INTRAVENOUS | Status: AC
  Administered 2014-12-16: 1000 mL via INTRAVENOUS

## 2014-12-16 MED ORDER — VANCOMYCIN HCL 10 G IV SOLR
1250.0000 mg | Freq: Once | INTRAVENOUS | Status: AC
  Administered 2014-12-16: 1250 mg via INTRAVENOUS
  Filled 2014-12-16: qty 1250

## 2014-12-16 MED ORDER — VANCOMYCIN HCL IN DEXTROSE 1-5 GM/200ML-% IV SOLN: 1000.0000 mg | Freq: Once | INTRAVENOUS | Status: DC

## 2014-12-16 MED ORDER — SODIUM CHLORIDE 0.9 % IV BOLUS (SEPSIS)
500.0000 mL | INTRAVENOUS | Status: AC
  Administered 2014-12-17: 500 mL via INTRAVENOUS

## 2014-12-16 MED ORDER — FENTANYL CITRATE (PF) 100 MCG/2ML IJ SOLN
100.0000 ug | Freq: Once | INTRAMUSCULAR | Status: AC
  Administered 2014-12-16: 100 ug via INTRAVENOUS
  Filled 2014-12-16: qty 2

## 2014-12-16 MED ORDER — METOCLOPRAMIDE HCL 5 MG/ML IJ SOLN
5.0000 mg | Freq: Once | INTRAMUSCULAR | Status: AC
  Administered 2014-12-16: 5 mg via INTRAVENOUS

## 2014-12-16 MED ORDER — IOHEXOL 300 MG/ML  SOLN: 25.0000 mL | INTRAMUSCULAR | Status: AC

## 2014-12-16 MED ORDER — ACETAMINOPHEN 325 MG PO TABS
650.0000 mg | ORAL_TABLET | Freq: Once | ORAL | Status: AC
  Administered 2014-12-16: 650 mg via ORAL
  Filled 2014-12-16: qty 2

## 2014-12-16 NOTE — ED Notes (Signed)
Pt stated that she starting vomiting around noon today. Pt denied any chest pain or shortness of breath.

## 2014-12-16 NOTE — Progress Notes (Signed)
ANTIBIOTIC CONSULT NOTE - INITIAL  Pharmacy Consult for Vancomycin, Zosyn  Indication: Sepsis   Allergies  Allergen Reactions  . Codeine Nausea And Vomiting  . Fosamax [Alendronate Sodium]     Unknown.   Dion Saucier [Calcitonin]     Unknown.  . Nsaids     Due to renal function.   . Quinolones Other (See Comments)    platelets  . Sulfa Antibiotics Nausea And Vomiting    Patient Measurements:   Actual Body Weight:   Vital Signs: Temp: 101.9 F (38.8 C) (12/02 2100) Temp Source: Rectal (12/02 2100) BP: 154/73 mmHg (12/02 2100) Pulse Rate: 93 (12/02 2100) Intake/Output from previous day:   Intake/Output from this shift:    Labs: No results for input(s): WBC, HGB, PLT, LABCREA, CREATININE in the last 72 hours. CrCl cannot be calculated (Unknown ideal weight.). No results for input(s): VANCOTROUGH, VANCOPEAK, VANCORANDOM, GENTTROUGH, GENTPEAK, GENTRANDOM, TOBRATROUGH, TOBRAPEAK, TOBRARND, AMIKACINPEAK, AMIKACINTROU, AMIKACIN in the last 72 hours.   Microbiology: No results found for this or any previous visit (from the past 720 hour(s)).  Medical History: Past Medical History  Diagnosis Date  . Hypertension   . Arthritis   . Diverticulitis   . Irritable bowel   . PMR (polymyalgia rheumatica) (HCC)     a. On chronic prednisone.  Marland Kitchen Spinal stenosis   . Hypothyroidism   . Chronic kidney disease     Stage III/IV  . Hyperlipidemia   . Cataract   . Anxiety   . Osteoporosis   . Symptomatic bradycardia     a. Adm 03/2013 - taken off clonidine and BB.  Marland Kitchen Premature atrial contractions     a. Noted during adm 03/2013, corresponding with patient's palpitations.  . Aortic stenosis     a. Mild AS by echo 03/2013.  Marland Kitchen Hypokalemia     Medications:   (Not in a hospital admission) Assessment: 89 YOF who presented with pain in both arms and legs and her abdomen along with shortness of breath. Pharmacy consulted to start IV Vancomycin and Zosyn for Sepsis. Tmax 101.9. WBC  wnl. LA 3.42.   Goal of Therapy:  Vancomycin trough level 15-20 mcg/ml  Plan:  -Zosyn 3.375 gm IV once  -Vancomycin 1250 mg IV once -F/u maintenance doses once labs result   Albertina Parr, PharmD., BCPS Clinical Pharmacist Pager [phone]

## 2014-12-16 NOTE — ED Provider Notes (Addendum)
CSN: [id number]     Arrival date & time 12/16/14  2034 History   First MD Initiated Contact with Patient 12/16/14 2053     No chief complaint on file.  Chief complaint "I ache all over" vomiting multiple times  (Consider location/radiation/quality/duration/timing/severity/associated sxs/prior Treatment) HPI Patient reports complaining of pain in both arms and both legs abdomen and had onset this afternoon. She gets similar pain several times per week however today she vomited multiple times which is not typical for her. She denies chest pain. She admits to some shortness of breath denies cough. No treatment prior to coming here. She is treated with hydrocodone for chronic pain. No other associated symptoms. Brought by EMS. Nothing makes symptoms better or worse. Last bowel movement earlier today, normal. Past Medical History  Diagnosis Date  . Hypertension   . Arthritis   . Diverticulitis   . Irritable bowel   . PMR (polymyalgia rheumatica)     a. On chronic prednisone.  Marland Kitchen Spinal stenosis   . Hypothyroidism   . Chronic kidney disease     Stage III/IV  . Hyperlipidemia   . Cataract   . Anxiety   . Osteoporosis   . Symptomatic bradycardia     a. Adm 03/2013 - taken off clonidine and BB.  Marland Kitchen Premature atrial contractions     a. Noted during adm 03/2013, corresponding with patient's palpitations.  . Aortic stenosis     a. Mild AS by echo 03/2013.  Marland Kitchen Hypokalemia    Past Surgical History  Procedure Laterality Date  . Back surgery    . Appendectomy    . Cholecystectomy    . Eye surgery    . Abdominal hysterectomy     Family History  Problem Relation Age of Onset  . Hyperlipidemia Mother   . Hypertension Mother   . Hypertension Father   . Hyperlipidemia Father   . Hyperlipidemia Sister   . Hypertension Sister   . Diabetes Neg Hx   . Heart attack Neg Hx   . Sudden death Neg Hx    Social History  Substance Use Topics  . Smoking status: Never Smoker   . Smokeless tobacco:  Never Used  . Alcohol Use: No   OB History    No data available     Review of Systems  Constitutional: Negative.   Respiratory: Positive for shortness of breath.   Cardiovascular: Negative.   Gastrointestinal: Positive for nausea, vomiting and abdominal pain.  Musculoskeletal: Positive for myalgias.  Skin: Negative.   Allergic/Immunologic: Positive for immunocompromised state.       Chronically on steroids  Neurological: Positive for headaches.  Psychiatric/Behavioral: Negative.   All other systems reviewed and are negative.     Allergies  Codeine; Fosamax; Miacalcin; Nsaids; Quinolones; and Sulfa antibiotics  Home Medications   Prior to Admission medications   Medication Sig Start Date End Date Taking? Authorizing Provider  ALPRAZolam Duanne Moron) 0.5 MG tablet Take 0.25 mg by mouth 4 (four) times daily.     Historical Provider, MD  amLODipine (NORVASC) 5 MG tablet Take 5 mg by mouth daily.    Historical Provider, MD  Cholecalciferol (VITAMIN D) 2000 UNITS CAPS Take 2,000 Units by mouth daily.    Historical Provider, MD  furosemide (LASIX) 40 MG tablet Take 40 mg by mouth every morning.     Historical Provider, MD  hydrALAZINE (APRESOLINE) 50 MG tablet Take 1 tablet (50 mg total) by mouth 3 (three) times daily. (Every 8 hours) 04/04/13  Dayna N Dunn, PA-C  HYDROcodone-acetaminophen (NORCO) 10-325 MG per tablet Take 1 tablet by mouth every 6 (six) hours as needed. pain 07/13/11   Sheila Oats, MD  Ketotifen Fumarate (ALLERGY EYE DROPS OP) Apply 1 drop to eye 2 (two) times daily.    Historical Provider, MD  levothyroxine (SYNTHROID, LEVOTHROID) 50 MCG tablet Take 50 mcg by mouth daily. Pt takes 1/2 tab for 30mcg dose    Historical Provider, MD  omeprazole (PRILOSEC) 20 MG capsule Take 20 mg by mouth daily.    Historical Provider, MD  oxyCODONE-acetaminophen (PERCOCET) 5-325 MG per tablet Take 1 tablet by mouth every 6 (six) hours as needed. 08/18/13   Harvie Heck, PA-C   polyethylene glycol (MIRALAX / GLYCOLAX) packet Take 17 g by mouth at bedtime.    Historical Provider, MD  potassium chloride SA (K-DUR,KLOR-CON) 20 MEQ tablet Take 1 tablet (20 mEq total) by mouth 2 (two) times daily. 04/04/13   Dayna N Dunn, PA-C  predniSONE (DELTASONE) 5 MG tablet Take 5 mg by mouth every morning.    Historical Provider, MD  sertraline (ZOLOFT) 25 MG tablet Take 25 mg by mouth at bedtime.     Historical Provider, MD   There were no vitals taken for this visit. Physical Exam  Constitutional: She appears well-developed and well-nourished. She appears distressed.  Appears uncomfortable  HENT:  Head: Normocephalic and atraumatic.  Mucous membranes dry  Eyes: Conjunctivae are normal. Pupils are equal, round, and reactive to light.  Neck: Neck supple. No tracheal deviation present. No thyromegaly present.  Cardiovascular: Normal rate and regular rhythm.   No murmur heard. Pulmonary/Chest: Breath sounds normal.  Tachypnea  Abdominal: Soft. Bowel sounds are normal. She exhibits no distension. There is tenderness.  Right upper quadrant surgical scar, tender and upper abdomen  Musculoskeletal: Normal range of motion. She exhibits no edema or tenderness.  Neurological: She is alert. Coordination normal.  Skin: Skin is warm and dry. No rash noted.  Psychiatric: She has a normal mood and affect.  Nursing note and vitals reviewed.   ED Course  Procedures (including critical care time) Labs Review Labs Reviewed - No data to display  Imaging Review No results found. I have personally reviewed and evaluated these images and lab results as part of my medical decision-making.   EKG Interpretation None     ED ECG REPORT   Date: 12/16/2014  Rate: 90  Rhythm: Ectopic atrial rhythm  QRS Axis: left  Intervals: normal  ST/T Wave abnormalities: nonspecific T wave changes  Conduction Disutrbances:none  Narrative Interpretation:   Old EKG Reviewed: Prior tracing poor quality  data  I have personally reviewed the EKG tracing and disagree with the computerized printout as noted. No evidence of acute MI. Wandering baseline present in this tracing  130 am continues to him plain of aching all over. Nausea is controlled after treatment with intravenous Reglan. Additional intravenous fentanyl ordered. Patient is signed out to Dr. Sabra Heck Results for orders placed or performed during the hospital encounter of 12/16/14  Comprehensive metabolic panel  Result Value Ref Range   Sodium 142 135 - 145 mmol/L   Potassium 3.3 (L) 3.5 - 5.1 mmol/L   Chloride 106 101 - 111 mmol/L   CO2 22 22 - 32 mmol/L   Glucose, Bld 128 (H) 65 - 99 mg/dL   BUN 39 (H) 6 - 20 mg/dL   Creatinine, Ser 1.60 (H) 0.44 - 1.00 mg/dL   Calcium 8.8 (L) 8.9 - 10.3 mg/dL  Total Protein 6.0 (L) 6.5 - 8.1 g/dL   Albumin 3.6 3.5 - 5.0 g/dL   AST 28 15 - 41 U/L   ALT 13 (L) 14 - 54 U/L   Alkaline Phosphatase 59 38 - 126 U/L   Total Bilirubin 0.8 0.3 - 1.2 mg/dL   GFR calc non Af Amer 26 (L) >60 mL/min   GFR calc Af Amer 30 (L) >60 mL/min   Anion gap 14 5 - 15  CBC WITH DIFFERENTIAL  Result Value Ref Range   WBC 7.3 4.0 - 10.5 K/uL   RBC 4.65 3.87 - 5.11 MIL/uL   Hemoglobin 13.3 12.0 - 15.0 g/dL   HCT 40.1 36.0 - 46.0 %   MCV 86.2 78.0 - 100.0 fL   MCH 28.6 26.0 - 34.0 pg   MCHC 33.2 30.0 - 36.0 g/dL   RDW 14.5 11.5 - 15.5 %   Platelets 144 (L) 150 - 400 K/uL   Neutrophils Relative % 81 %   Neutro Abs 6.0 1.7 - 7.7 K/uL   Lymphocytes Relative 11 %   Lymphs Abs 0.8 0.7 - 4.0 K/uL   Monocytes Relative 8 %   Monocytes Absolute 0.6 0.1 - 1.0 K/uL   Eosinophils Relative 0 %   Eosinophils Absolute 0.0 0.0 - 0.7 K/uL   Basophils Relative 0 %   Basophils Absolute 0.0 0.0 - 0.1 K/uL  Urinalysis, Routine w reflex microscopic (not at [facility])  Result Value Ref Range   Color, Urine YELLOW YELLOW   APPearance CLEAR CLEAR   Specific Gravity, Urine 1.011 1.005 - 1.030   pH 7.0 5.0 - 8.0   Glucose, UA  NEGATIVE NEGATIVE mg/dL   Hgb urine dipstick NEGATIVE NEGATIVE   Bilirubin Urine NEGATIVE NEGATIVE   Ketones, ur NEGATIVE NEGATIVE mg/dL   Protein, ur NEGATIVE NEGATIVE mg/dL   Nitrite POSITIVE (A) NEGATIVE   Leukocytes, UA TRACE (A) NEGATIVE  Lipase, blood  Result Value Ref Range   Lipase 34 11 - 51 U/L  Urine microscopic-add on  Result Value Ref Range   Squamous Epithelial / LPF 0-5 (A) NONE SEEN   WBC, UA 6-30 0 - 5 WBC/hpf   RBC / HPF 6-30 0 - 5 RBC/hpf   Bacteria, UA FEW (A) NONE SEEN   Casts HYALINE CASTS (A) NEGATIVE  I-Stat CG4 Lactic Acid, ED  (not at  [facility])  Result Value Ref Range   Lactic Acid, Venous 3.42 (HH) 0.5 - 2.0 mmol/L   Comment NOTIFIED PHYSICIAN   I-stat troponin, ED  Result Value Ref Range   Troponin i, poc 0.01 0.00 - 0.08 ng/mL   Comment 3          I-Stat CG4 Lactic Acid, ED  (not at  [facility])  Result Value Ref Range   Lactic Acid, Venous 2.43 (HH) 0.5 - 2.0 mmol/L   Comment NOTIFIED PHYSICIAN    Ct Abdomen Pelvis Wo Contrast  12/17/2014  CLINICAL DATA:  Vomiting beginning at noon today. History of diverticulitis, irritable bowel, chronic kidney disease, hypertension. EXAM: CT ABDOMEN AND PELVIS WITHOUT CONTRAST TECHNIQUE: Multidetector CT imaging of the abdomen and pelvis was performed following the standard protocol without IV contrast. Oral contrast administered. COMPARISON:  CT abdomen pelvis August 18, 2013 FINDINGS: LUNG BASES: Similar to slightly larger small bilateral pleural effusions. Associated mild compressive atelectasis. Heart size is normal. Mild coronary artery calcifications. No pericardial effusions. KIDNEYS/BLADDER: Kidneys are orthotopic, demonstrating normal size and morphology. No nephrolithiasis, hydronephrosis; limited assessment for renal masses on  this nonenhanced examination. The unopacified ureters are normal in course and caliber. Urinary bladder is partially distended and unremarkable. SOLID ORGANS: The liver is diffusely and mildly  dense, which can be seen with amiodarone use, otherwise unremarkable. Spleen, pancreas and adrenal glands are unremarkable for this non-contrast examination. Status post cholecystectomy. GASTROINTESTINAL TRACT: Partially imaged moderate to large hiatal hernia. The stomach, small and large bowel are normal in course and caliber . Mild small bowel wall thickening. Moderate sigmoid diverticulosis. The appendix is not discretely identified, however there are no inflammatory changes in the right lower quadrant. PERITONEUM/RETROPERITONEUM: " Misty " mesentery with small reactive lymph nodes. Aortoiliac vessels are normal in course and caliber, moderate to severe calcific atherosclerosis. No lymphadenopathy by CT size criteria. Status post hysterectomy. No intraperitoneal free fluid nor free air. SOFT TISSUES/ OSSEOUS STRUCTURES: Nonsuspicious. Small fat containing umbilical hernia. Severe thoracolumbar levoscoliosis. Osteopenia. Moderate chronic T11 compression fracture. IMPRESSION: Mild small bowel wall thickening, which can be seen with enteritis. " Misty " mesentery and small lymph nodes are likely reactive. Partially imaged moderate to large hiatal hernia. Moderate sigmoid diverticulosis without acute diverticulitis. Similar to slightly larger pleural effusions. Electronically Signed   By: Elon Alas M.D.   On: 12/17/2014 01:39   Dg Chest Port 1 View  12/16/2014  CLINICAL DATA:  Dyspnea, cough and vomiting for 1 day EXAM: PORTABLE CHEST 1 VIEW COMPARISON:  04/02/2013 FINDINGS: There is moderate unchanged cardiomegaly. There is a large hiatal hernia. No confluent airspace consolidation. Minimal streaky atelectatic appearing opacities are present adjacent to the hiatal hernia. No large effusion. Pulmonary vasculature is normal. IMPRESSION: Cardiomegaly. Hiatal hernia. Mild atelectatic appearing basilar opacities on the left. Electronically Signed   By: Andreas Newport M.D.   On: 12/16/2014 23:35    MDM   Code sepsis called based on surgical criteria of respiratory rate, fever, , lactic acidosis source unclear Final diagnoses:  None   end organ failure likely intra-abdominal based on CT scan Renal insufficiency is chronic Dx #1 sepsis #3 hypoklaemia #3renal insufficiency #4enteritis CRITICAL CARE Performed by: Orlie Dakin Total critical care time: 35 minutes Critical care time was exclusive of separately billable procedures and treating other patients. Critical care was necessary to treat or prevent imminent or life-threatening deterioration. Critical care was time spent personally by me on the following activities: development of treatment plan with patient and/or surrogate as well as nursing, discussions with consultants, evaluation of patient's response to treatment, examination of patient, obtaining history from patient or surrogate, ordering and performing treatments and interventions, ordering and review of laboratory studies, ordering and review of radiographic studies, pulse oximetry and re-evaluation of patient's condition.    Orlie Dakin, MD 12/17/14 [id number]  Orlie Dakin, MD 12/17/14 [id number]

## 2014-12-17 ENCOUNTER — Encounter (HOSPITAL_COMMUNITY): Payer: Self-pay | Admitting: Emergency Medicine

## 2014-12-17 ENCOUNTER — Emergency Department (HOSPITAL_COMMUNITY): Payer: Medicare Other

## 2014-12-17 DIAGNOSIS — A419 Sepsis, unspecified organism: Secondary | ICD-10-CM | POA: Diagnosis present

## 2014-12-17 DIAGNOSIS — M810 Age-related osteoporosis without current pathological fracture: Secondary | ICD-10-CM | POA: Diagnosis present

## 2014-12-17 DIAGNOSIS — R2681 Unsteadiness on feet: Secondary | ICD-10-CM | POA: Diagnosis not present

## 2014-12-17 DIAGNOSIS — N183 Chronic kidney disease, stage 3 (moderate): Secondary | ICD-10-CM | POA: Diagnosis not present

## 2014-12-17 DIAGNOSIS — R29898 Other symptoms and signs involving the musculoskeletal system: Secondary | ICD-10-CM | POA: Diagnosis not present

## 2014-12-17 DIAGNOSIS — E876 Hypokalemia: Secondary | ICD-10-CM | POA: Diagnosis present

## 2014-12-17 DIAGNOSIS — N179 Acute kidney failure, unspecified: Secondary | ICD-10-CM | POA: Diagnosis present

## 2014-12-17 DIAGNOSIS — R600 Localized edema: Secondary | ICD-10-CM | POA: Diagnosis present

## 2014-12-17 DIAGNOSIS — Z7952 Long term (current) use of systemic steroids: Secondary | ICD-10-CM | POA: Diagnosis not present

## 2014-12-17 DIAGNOSIS — M6281 Muscle weakness (generalized): Secondary | ICD-10-CM | POA: Diagnosis not present

## 2014-12-17 DIAGNOSIS — E860 Dehydration: Secondary | ICD-10-CM | POA: Diagnosis present

## 2014-12-17 DIAGNOSIS — E039 Hypothyroidism, unspecified: Secondary | ICD-10-CM | POA: Diagnosis present

## 2014-12-17 DIAGNOSIS — R935 Abnormal findings on diagnostic imaging of other abdominal regions, including retroperitoneum: Secondary | ICD-10-CM | POA: Diagnosis not present

## 2014-12-17 DIAGNOSIS — A047 Enterocolitis due to Clostridium difficile: Secondary | ICD-10-CM | POA: Diagnosis present

## 2014-12-17 DIAGNOSIS — G43A1 Cyclical vomiting, intractable: Secondary | ICD-10-CM

## 2014-12-17 DIAGNOSIS — R933 Abnormal findings on diagnostic imaging of other parts of digestive tract: Secondary | ICD-10-CM | POA: Diagnosis not present

## 2014-12-17 DIAGNOSIS — K529 Noninfective gastroenteritis and colitis, unspecified: Secondary | ICD-10-CM | POA: Diagnosis present

## 2014-12-17 DIAGNOSIS — F1920 Other psychoactive substance dependence, uncomplicated: Secondary | ICD-10-CM

## 2014-12-17 DIAGNOSIS — R1030 Lower abdominal pain, unspecified: Secondary | ICD-10-CM | POA: Diagnosis not present

## 2014-12-17 DIAGNOSIS — R2689 Other abnormalities of gait and mobility: Secondary | ICD-10-CM | POA: Diagnosis not present

## 2014-12-17 DIAGNOSIS — Z221 Carrier of other intestinal infectious diseases: Secondary | ICD-10-CM | POA: Diagnosis not present

## 2014-12-17 DIAGNOSIS — N182 Chronic kidney disease, stage 2 (mild): Secondary | ICD-10-CM | POA: Diagnosis not present

## 2014-12-17 DIAGNOSIS — N184 Chronic kidney disease, stage 4 (severe): Secondary | ICD-10-CM | POA: Diagnosis present

## 2014-12-17 DIAGNOSIS — I129 Hypertensive chronic kidney disease with stage 1 through stage 4 chronic kidney disease, or unspecified chronic kidney disease: Secondary | ICD-10-CM | POA: Diagnosis present

## 2014-12-17 DIAGNOSIS — R197 Diarrhea, unspecified: Secondary | ICD-10-CM | POA: Diagnosis not present

## 2014-12-17 DIAGNOSIS — Z66 Do not resuscitate: Secondary | ICD-10-CM | POA: Diagnosis present

## 2014-12-17 DIAGNOSIS — R112 Nausea with vomiting, unspecified: Secondary | ICD-10-CM | POA: Insufficient documentation

## 2014-12-17 DIAGNOSIS — R1110 Vomiting, unspecified: Secondary | ICD-10-CM | POA: Diagnosis not present

## 2014-12-17 DIAGNOSIS — M353 Polymyalgia rheumatica: Secondary | ICD-10-CM | POA: Diagnosis present

## 2014-12-17 DIAGNOSIS — F419 Anxiety disorder, unspecified: Secondary | ICD-10-CM | POA: Diagnosis present

## 2014-12-17 DIAGNOSIS — A09 Infectious gastroenteritis and colitis, unspecified: Secondary | ICD-10-CM | POA: Diagnosis not present

## 2014-12-17 HISTORY — DX: Noninfective gastroenteritis and colitis, unspecified: K52.9

## 2014-12-17 HISTORY — DX: Other psychoactive substance dependence, uncomplicated: F19.20

## 2014-12-17 LAB — BASIC METABOLIC PANEL
ANION GAP: 8 (ref 5–15)
BUN: 37 mg/dL — AB (ref 6–20)
CALCIUM: 7.4 mg/dL — AB (ref 8.9–10.3)
CO2: 24 mmol/L (ref 22–32)
Chloride: 110 mmol/L (ref 101–111)
Creatinine, Ser: 1.56 mg/dL — ABNORMAL HIGH (ref 0.44–1.00)
GFR calc Af Amer: 31 mL/min — ABNORMAL LOW (ref >60)
GFR, EST NON AFRICAN AMERICAN: 27 mL/min — AB (ref >60)
Glucose, Bld: 112 mg/dL — ABNORMAL HIGH (ref 65–99)
POTASSIUM: 3.4 mmol/L — AB (ref 3.5–5.1)
SODIUM: 142 mmol/L (ref 135–145)

## 2014-12-17 LAB — URINALYSIS, ROUTINE W REFLEX MICROSCOPIC
BILIRUBIN URINE: NEGATIVE
GLUCOSE, UA: NEGATIVE mg/dL
HGB URINE DIPSTICK: NEGATIVE
KETONES UR: NEGATIVE mg/dL
Nitrite: POSITIVE — AB
PROTEIN: NEGATIVE mg/dL
Specific Gravity, Urine: 1.011 (ref 1.005–1.030)
pH: 7 (ref 5.0–8.0)

## 2014-12-17 LAB — CORTISOL: Cortisol, Plasma: 18.7 ug/dL

## 2014-12-17 LAB — MRSA PCR SCREENING: MRSA by PCR: NEGATIVE

## 2014-12-17 LAB — URINE MICROSCOPIC-ADD ON

## 2014-12-17 LAB — I-STAT CG4 LACTIC ACID, ED: Lactic Acid, Venous: 2.43 mmol/L (ref 0.5–2.0)

## 2014-12-17 LAB — C DIFFICILE QUICK SCREEN W PCR REFLEX
C Diff antigen: POSITIVE — AB
C Diff toxin: NEGATIVE

## 2014-12-17 LAB — MAGNESIUM: MAGNESIUM: 1.9 mg/dL (ref 1.7–2.4)

## 2014-12-17 MED ORDER — SODIUM CHLORIDE 0.9 % IJ SOLN
3.0000 mL | Freq: Two times a day (BID) | INTRAMUSCULAR | Status: DC
Start: 2014-12-17 — End: 2014-12-21
  Administered 2014-12-18 – 2014-12-20 (×3): 3 mL via INTRAVENOUS

## 2014-12-17 MED ORDER — HYDROCODONE-ACETAMINOPHEN 10-325 MG PO TABS
1.0000 | ORAL_TABLET | Freq: Four times a day (QID) | ORAL | Status: DC | PRN
  Administered 2014-12-17 – 2014-12-21 (×16): 1 via ORAL
  Filled 2014-12-17 (×17): qty 1

## 2014-12-17 MED ORDER — METRONIDAZOLE IN NACL 5-0.79 MG/ML-% IV SOLN
500.0000 mg | Freq: Three times a day (TID) | INTRAVENOUS | Status: DC
  Administered 2014-12-17 – 2014-12-20 (×10): 500 mg via INTRAVENOUS
  Filled 2014-12-17 (×10): qty 100

## 2014-12-17 MED ORDER — SODIUM CHLORIDE 0.9 % IV SOLN
1.5000 g | Freq: Once | INTRAVENOUS | Status: DC
  Filled 2014-12-17: qty 1.5

## 2014-12-17 MED ORDER — PREDNISONE 1 MG PO TABS: 1.0000 mg | ORAL_TABLET | Freq: Every day | ORAL | Status: DC

## 2014-12-17 MED ORDER — METHYLPREDNISOLONE SODIUM SUCC 40 MG IJ SOLR: 40.0000 mg | Freq: Every day | INTRAMUSCULAR | Status: DC

## 2014-12-17 MED ORDER — POTASSIUM CHLORIDE CRYS ER 20 MEQ PO TBCR: 40.0000 meq | EXTENDED_RELEASE_TABLET | Freq: Once | ORAL | Status: DC

## 2014-12-17 MED ORDER — SODIUM CHLORIDE 0.9 % IV SOLN
INTRAVENOUS | Status: DC
  Administered 2014-12-17 – 2014-12-19 (×4): via INTRAVENOUS
  Filled 2014-12-17 (×11): qty 1000

## 2014-12-17 MED ORDER — POTASSIUM CHLORIDE 10 MEQ/100ML IV SOLN
10.0000 meq | Freq: Once | INTRAVENOUS | Status: AC
  Administered 2014-12-17: 10 meq via INTRAVENOUS
  Filled 2014-12-17: qty 100

## 2014-12-17 MED ORDER — ALPRAZOLAM 0.25 MG PO TABS
0.2500 mg | ORAL_TABLET | Freq: Four times a day (QID) | ORAL | Status: DC
  Administered 2014-12-17 – 2014-12-21 (×18): 0.25 mg via ORAL
  Filled 2014-12-17 (×18): qty 1

## 2014-12-17 MED ORDER — SODIUM CHLORIDE 0.9 % IV SOLN
INTRAVENOUS | Status: DC
  Administered 2014-12-17: 04:00:00 via INTRAVENOUS

## 2014-12-17 MED ORDER — HEPARIN SODIUM (PORCINE) 5000 UNIT/ML IJ SOLN
5000.0000 [IU] | Freq: Three times a day (TID) | INTRAMUSCULAR | Status: DC
  Administered 2014-12-17 – 2014-12-21 (×13): 5000 [IU] via SUBCUTANEOUS
  Filled 2014-12-17 (×12): qty 1

## 2014-12-17 MED ORDER — PREDNISONE 5 MG PO TABS: 5.0000 mg | ORAL_TABLET | Freq: Every day | ORAL | Status: DC

## 2014-12-17 MED ORDER — DEXTROSE 5 % IV SOLN
2.0000 g | INTRAVENOUS | Status: DC
  Filled 2014-12-17: qty 2

## 2014-12-17 MED ORDER — ONDANSETRON HCL 4 MG/2ML IJ SOLN: 4.0000 mg | Freq: Three times a day (TID) | INTRAMUSCULAR | Status: DC | PRN

## 2014-12-17 MED ORDER — SODIUM CHLORIDE 0.9 % IV SOLN
1.5000 g | Freq: Two times a day (BID) | INTRAVENOUS | Status: DC
  Administered 2014-12-17: 1.5 g via INTRAVENOUS
  Filled 2014-12-17 (×2): qty 1.5

## 2014-12-17 MED ORDER — SERTRALINE HCL 25 MG PO TABS
25.0000 mg | ORAL_TABLET | Freq: Every day | ORAL | Status: DC
  Administered 2014-12-17 – 2014-12-20 (×3): 25 mg via ORAL
  Filled 2014-12-17 (×5): qty 1

## 2014-12-17 MED ORDER — ONDANSETRON HCL 4 MG/2ML IJ SOLN
4.0000 mg | Freq: Four times a day (QID) | INTRAMUSCULAR | Status: DC | PRN
Start: 2014-12-17 — End: 2014-12-21
  Administered 2014-12-21: 4 mg via INTRAVENOUS
  Filled 2014-12-17: qty 2

## 2014-12-17 MED ORDER — PANTOPRAZOLE SODIUM 40 MG PO TBEC
40.0000 mg | DELAYED_RELEASE_TABLET | Freq: Every day | ORAL | Status: DC
  Administered 2014-12-17 – 2014-12-21 (×5): 40 mg via ORAL
  Filled 2014-12-17 (×6): qty 1

## 2014-12-17 MED ORDER — DEXTROSE 5 % IV SOLN
1.0000 g | Freq: Once | INTRAVENOUS | Status: AC
  Administered 2014-12-17: 1 g via INTRAVENOUS
  Filled 2014-12-17: qty 10

## 2014-12-17 MED ORDER — FENTANYL CITRATE (PF) 100 MCG/2ML IJ SOLN
50.0000 ug | Freq: Once | INTRAMUSCULAR | Status: AC
Start: 2014-12-17 — End: 2014-12-17
  Administered 2014-12-17: 50 ug via INTRAVENOUS
  Filled 2014-12-17: qty 2

## 2014-12-17 MED ORDER — PREDNISONE 5 MG PO TABS
6.0000 mg | ORAL_TABLET | Freq: Every day | ORAL | Status: DC
  Administered 2014-12-17 – 2014-12-21 (×5): 6 mg via ORAL
  Filled 2014-12-17 (×7): qty 1

## 2014-12-17 MED ORDER — DEXTROSE 5 % IV SOLN
1.0000 g | INTRAVENOUS | Status: DC
  Administered 2014-12-17: 1 g via INTRAVENOUS
  Filled 2014-12-17: qty 10

## 2014-12-17 MED ORDER — LEVOTHYROXINE SODIUM 50 MCG PO TABS
50.0000 ug | ORAL_TABLET | Freq: Every day | ORAL | Status: DC
  Administered 2014-12-17 – 2014-12-21 (×5): 50 ug via ORAL
  Filled 2014-12-17 (×6): qty 1

## 2014-12-17 MED ORDER — PREDNISONE 5 MG PO TABS: 5.0000 mg | ORAL_TABLET | Freq: Every morning | ORAL | Status: DC

## 2014-12-17 MED ORDER — METHYLPREDNISOLONE SODIUM SUCC 125 MG IJ SOLR: 60.0000 mg | Freq: Every day | INTRAMUSCULAR | Status: DC

## 2014-12-17 NOTE — ED Notes (Signed)
Breakfast ordered; Hospital bed requested.

## 2014-12-17 NOTE — Clinical Social Work Note (Signed)
Clinical Social Work Assessment  Patient Details  Name: Christina Cabrera MRN: [id number] Date of Birth: 02-06-1917  Date of referral:  12/17/14               Reason for consult:  Facility Placement                Permission sought to share information with:  Case Manager, Facility Sport and exercise psychologist, Family Supports Permission granted to share information::  Yes, Verbal Permission Granted  Name::     Christina Cabrera  Agency::     Relationship::  Daughter   Contact Information:  [phone]  Housing/Transportation Living arrangements for the past 2 months:  [place] of Information:  Adult Children Patient Interpreter Needed:  None Criminal Activity/Legal Involvement Pertinent to Current Situation/Hospitalization:  No - Comment as needed Significant Relationships:  Adult Children Lives with:  Facility Resident Do you feel safe going back to the place where you live?  Yes Need for family participation in patient care:  Yes (Comment)  Care giving concerns:  Daughter Thayer Headings would like for the patient to return [facility] once medically cleared.    Social Worker assessment / plan:  CSW advised by MD to speak with family regarding discharge planning. CSW made contact with daughter Thayer Headings who informed CSW that patient is a resident of [place], and has been for the past 16  Years. Daughter has requested for patient to return back to [facility] once medically stable. CSW informed daughter that CSW will contact facility to explore any concerns regarding the patient returning. CSW will then fax over requested patient information. CSW to complete FL2 for MD signature.    Employment status:  Disabled (Comment on whether or not currently receiving Disability) Insurance information:  Medicare PT Recommendations:  Not assessed at this time Information / Referral to community resources:     Patient/Family's Response to care:   Daughter was appreciative of the services provided by CSW.   Patient/Family's Understanding of and Emotional Response to Diagnosis, Current Treatment, and Prognosis:  Daughter is aware and understanding of current treatment and prognosis. Daughter voiced concerns with the facility possibly not having bed for the patient. CSW provided daughter with encouragement, and informed her that CSW will follow up once contact has been made with facility.   Assessment Appearance:  Appears stated age Attitude/Demeanor/Rapport:   (Calm and Cooperative ) Affect (typically observed):  Accepting, Appropriate, Calm Orientation:  Oriented to Self, Oriented to Place Alcohol / Substance use:  Not Applicable Psych involvement (Current and /or in the community):  No (Comment)  Discharge Needs  Concerns to be addressed:  Discharge Planning Concerns Readmission within the last 30 days:  No Current discharge risk:  Other (Further medical work up) Barriers to Discharge:  Barriers Resolved   Raymondo Band, LCSW 12/17/2014, 2:26 PM

## 2014-12-17 NOTE — H&P (Signed)
[facility] History and Physical  DARLINDA PEPI [id number] DOB: [birth year] DOA: 12/16/2014  Referring physician: EDP PCP: Gennette Pac, MD   Chief Complaint: Abd pain, N/V   HPI: Christina Cabrera is a 81 y.o. female who presents to the ED with generalized body aches, abdominal pain and tenderness, nausea and vomiting.  No treatment prior to coming to ED.  Brought to ED by EMS, nothing makes symptoms better or worse.  Work up in ED is suggestive of enteritis on CT scan with fever of 101.9, also becoming hypotensive which improved with IVF.  Review of Systems: Systems reviewed.  As above, otherwise negative  Past Medical History  Diagnosis Date  . Hypertension   . Arthritis   . Diverticulitis   . Irritable bowel   . PMR (polymyalgia rheumatica) (HCC)     a. On chronic prednisone.  Marland Kitchen Spinal stenosis   . Hypothyroidism   . Chronic kidney disease     Stage III/IV  . Hyperlipidemia   . Cataract   . Anxiety   . Osteoporosis   . Symptomatic bradycardia     a. Adm 03/2013 - taken off clonidine and BB.  Marland Kitchen Premature atrial contractions     a. Noted during adm 03/2013, corresponding with patient's palpitations.  . Aortic stenosis     a. Mild AS by echo 03/2013.  Marland Kitchen Hypokalemia    Past Surgical History  Procedure Laterality Date  . Back surgery    . Appendectomy    . Cholecystectomy    . Eye surgery    . Abdominal hysterectomy     Social History:  reports that she has never smoked. She has never used smokeless tobacco. She reports that she does not drink alcohol or use illicit drugs.  Allergies  Allergen Reactions  . Codeine Nausea And Vomiting  . Fosamax [Alendronate Sodium]     Unknown.   Dion Saucier [Calcitonin]     Unknown.  . Nsaids     Due to renal function.   . Quinolones Other (See Comments)    platelets  . Sulfa Antibiotics Nausea And Vomiting    Family History  Problem Relation Age of Onset  . Hyperlipidemia Mother   . Hypertension Mother    . Hypertension Father   . Hyperlipidemia Father   . Hyperlipidemia Sister   . Hypertension Sister   . Diabetes Neg Hx   . Heart attack Neg Hx   . Sudden death Neg Hx      Prior to Admission medications   Medication Sig Start Date End Date Taking? Authorizing Provider  ALPRAZolam Duanne Moron) 0.5 MG tablet Take 0.25 mg by mouth 4 (four) times daily.    Yes Historical Provider, MD  amLODipine (NORVASC) 5 MG tablet Take 5 mg by mouth daily.   Yes Historical Provider, MD  Cholecalciferol (VITAMIN D) 2000 UNITS CAPS Take 2,000 Units by mouth daily.   Yes Historical Provider, MD  furosemide (LASIX) 40 MG tablet Take 40 mg by mouth every morning.    Yes Historical Provider, MD  hydrALAZINE (APRESOLINE) 50 MG tablet Take 1 tablet (50 mg total) by mouth 3 (three) times daily. (Every 8 hours) 04/04/13  Yes Dayna N Dunn, PA-C  HYDROcodone-acetaminophen (NORCO) 10-325 MG per tablet Take 1 tablet by mouth every 6 (six) hours as needed. pain 07/13/11  Yes Adeline C Viyuoh, MD  levothyroxine (SYNTHROID, LEVOTHROID) 50 MCG tablet Take 50 mcg by mouth daily.    Yes Historical Provider, MD  Multiple Vitamins-Minerals (  PRESERVISION AREDS PO) Take 1 tablet by mouth 2 (two) times daily.   Yes Historical Provider, MD  omeprazole (PRILOSEC) 20 MG capsule Take 20 mg by mouth daily.   Yes Historical Provider, MD  polyethylene glycol (MIRALAX / GLYCOLAX) packet Take 17 g by mouth at bedtime.   Yes Historical Provider, MD  potassium chloride SA (K-DUR,KLOR-CON) 20 MEQ tablet Take 1 tablet (20 mEq total) by mouth 2 (two) times daily. 04/04/13  Yes Dayna N Dunn, PA-C  predniSONE (DELTASONE) 1 MG tablet Take 1 mg by mouth daily with breakfast. Take with prednisone 5mg    Yes Historical Provider, MD  predniSONE (DELTASONE) 5 MG tablet Take 5 mg by mouth every morning. Take with prednisone 1 mg   Yes Historical Provider, MD  sertraline (ZOLOFT) 25 MG tablet Take 25 mg by mouth at bedtime.    Yes Historical Provider, MD    Physical Exam: Filed Vitals:   12/17/14 0015 12/17/14 0030  BP: 128/56 128/59  Pulse:  68  Temp:    Resp:      BP 128/59 mmHg  Pulse 68  Temp(Src) 101.9 F (38.8 C) (Rectal)  Resp 23  SpO2 97%  General Appearance:    Alert, oriented, no distress, appears stated age  Head:    Normocephalic, atraumatic  Eyes:    PERRL, EOMI, sclera non-icteric        Nose:   Nares without drainage or epistaxis. Mucosa, turbinates normal  Throat:   Moist mucous membranes. Oropharynx without erythema or exudate.  Neck:   Supple. No carotid bruits.  No thyromegaly.  No lymphadenopathy.   Back:     No CVA tenderness, no spinal tenderness  Lungs:     Clear to auscultation bilaterally, without wheezes, rhonchi or rales  Chest wall:    No tenderness to palpitation  Heart:    Regular rate and rhythm without murmurs, gallops, rubs  Abdomen:     Soft, non-tender, nondistended, normal bowel sounds, no organomegaly  Genitalia:    deferred  Rectal:    deferred  Extremities:   No clubbing, cyanosis or edema.  Pulses:   2+ and symmetric all extremities  Skin:   Skin color, texture, turgor normal, no rashes or lesions  Lymph nodes:   Cervical, supraclavicular, and axillary nodes normal  Neurologic:   CNII-XII intact. Normal strength, sensation and reflexes      throughout    Labs on Admission:  Basic Metabolic Panel:  Recent Labs Lab 12/16/14 2141  NA 142  K 3.3*  CL 106  CO2 22  GLUCOSE 128*  BUN 39*  CREATININE 1.60*  CALCIUM 8.8*   Liver Function Tests:  Recent Labs Lab 12/16/14 2141  AST 28  ALT 13*  ALKPHOS 59  BILITOT 0.8  PROT 6.0*  ALBUMIN 3.6    Recent Labs Lab 12/16/14 2141  LIPASE 34   No results for input(s): AMMONIA in the last 168 hours. CBC:  Recent Labs Lab 12/16/14 2141  WBC 7.3  NEUTROABS 6.0  HGB 13.3  HCT 40.1  MCV 86.2  PLT 144*   Cardiac Enzymes: No results for input(s): CKTOTAL, CKMB, CKMBINDEX, TROPONINI in the last 168 hours.  BNP (last  3 results) No results for input(s): PROBNP in the last 8760 hours. CBG: No results for input(s): GLUCAP in the last 168 hours.  Radiological Exams on Admission: Ct Abdomen Pelvis Wo Contrast  12/17/2014  CLINICAL DATA:  Vomiting beginning at noon today. History of diverticulitis, irritable bowel, chronic kidney  disease, hypertension. EXAM: CT ABDOMEN AND PELVIS WITHOUT CONTRAST TECHNIQUE: Multidetector CT imaging of the abdomen and pelvis was performed following the standard protocol without IV contrast. Oral contrast administered. COMPARISON:  CT abdomen pelvis August 18, 2013 FINDINGS: LUNG BASES: Similar to slightly larger small bilateral pleural effusions. Associated mild compressive atelectasis. Heart size is normal. Mild coronary artery calcifications. No pericardial effusions. KIDNEYS/BLADDER: Kidneys are orthotopic, demonstrating normal size and morphology. No nephrolithiasis, hydronephrosis; limited assessment for renal masses on this nonenhanced examination. The unopacified ureters are normal in course and caliber. Urinary bladder is partially distended and unremarkable. SOLID ORGANS: The liver is diffusely and mildly dense, which can be seen with amiodarone use, otherwise unremarkable. Spleen, pancreas and adrenal glands are unremarkable for this non-contrast examination. Status post cholecystectomy. GASTROINTESTINAL TRACT: Partially imaged moderate to large hiatal hernia. The stomach, small and large bowel are normal in course and caliber . Mild small bowel wall thickening. Moderate sigmoid diverticulosis. The appendix is not discretely identified, however there are no inflammatory changes in the right lower quadrant. PERITONEUM/RETROPERITONEUM: " Misty " mesentery with small reactive lymph nodes. Aortoiliac vessels are normal in course and caliber, moderate to severe calcific atherosclerosis. No lymphadenopathy by CT size criteria. Status post hysterectomy. No intraperitoneal free fluid nor free  air. SOFT TISSUES/ OSSEOUS STRUCTURES: Nonsuspicious. Small fat containing umbilical hernia. Severe thoracolumbar levoscoliosis. Osteopenia. Moderate chronic T11 compression fracture. IMPRESSION: Mild small bowel wall thickening, which can be seen with enteritis. " Misty " mesentery and small lymph nodes are likely reactive. Partially imaged moderate to large hiatal hernia. Moderate sigmoid diverticulosis without acute diverticulitis. Similar to slightly larger pleural effusions. Electronically Signed   By: Elon Alas M.D.   On: 12/17/2014 01:39   Dg Chest Port 1 View  12/16/2014  CLINICAL DATA:  Dyspnea, cough and vomiting for 1 day EXAM: PORTABLE CHEST 1 VIEW COMPARISON:  04/02/2013 FINDINGS: There is moderate unchanged cardiomegaly. There is a large hiatal hernia. No confluent airspace consolidation. Minimal streaky atelectatic appearing opacities are present adjacent to the hiatal hernia. No large effusion. Pulmonary vasculature is normal. IMPRESSION: Cardiomegaly. Hiatal hernia. Mild atelectatic appearing basilar opacities on the left. Electronically Signed   By: Andreas Newport M.D.   On: 12/16/2014 23:35    EKG: Independently reviewed.  Assessment/Plan Principal Problem:   Sepsis ([place]) Active Problems:   Chronic kidney disease, stage III/IV   Enteritis   Corticosteroid dependence ([place])   1. Enteritis causing sepsis - 1. Continue IVF 2. Got zosyn / vanc in ED 3. Unasyn per pharm consult 4. Blood and urine cultures pending 2. Chronic corticosteroid dependence - 1. Would like to put patient on empiric stress dose steroids, however she is initially very hesitant. 2. BP is stabilized with IVF for the moment 3. Will go ahead and order cortisol level 4. At a minimum she will need to take and keep down her 6mg  prednisone daily that she has taken for years 5. If cortisol level that we are drawing now is not appropriately elevated (as expected in sick patient) then she agrees to go  on stress dose steroids. 3. CKD - creatinine of 1.6 today is her baseline    Code Status: Full  Family Communication: Daughter at bedside Disposition Plan: Admit to inpatient   Time spent: 39 min  GARDNER, JARED M. [facility] Pager [phone]  If 7AM-7PM, please contact the day team taking care of the patient [email or web address] Password [email or web address] 12/17/2014, 3:09 AM

## 2014-12-17 NOTE — ED Notes (Signed)
AT PT bed side for IV  Assessment. Pt sitting on side  of bed eating food.

## 2014-12-17 NOTE — ED Notes (Signed)
PT placed on a standard hospital bed for comfort.

## 2014-12-17 NOTE — Progress Notes (Signed)
ANTIBIOTIC CONSULT NOTE - Initial  Pharmacy Consult for Unasyn  Indication: intra-abd infection  Allergies  Allergen Reactions  . Codeine Nausea And Vomiting  . Fosamax [Alendronate Sodium]     Unknown.   Dion Saucier [Calcitonin]     Unknown.  . Nsaids     Due to renal function.   . Quinolones Other (See Comments)    platelets  . Sulfa Antibiotics Nausea And Vomiting    Patient Measurements:    Vital Signs: Temp: 101.9 F (38.8 C) (12/02 2100) Temp Source: Rectal (12/02 2100) BP: 128/59 mmHg (12/03 0030) Pulse Rate: 68 (12/03 0030) Intake/Output from previous day:   Intake/Output from this shift:    Labs:  Recent Labs  12/16/14 2141  WBC 7.3  HGB 13.3  PLT 144*  CREATININE 1.60*   CrCl cannot be calculated (Unknown ideal weight.). No results for input(s): VANCOTROUGH, VANCOPEAK, VANCORANDOM, GENTTROUGH, GENTPEAK, GENTRANDOM, TOBRATROUGH, TOBRAPEAK, TOBRARND, AMIKACINPEAK, AMIKACINTROU, AMIKACIN in the last 72 hours.   Microbiology: No results found for this or any previous visit (from the past 720 hour(s)).  Medical History: Past Medical History  Diagnosis Date  . Hypertension   . Arthritis   . Diverticulitis   . Irritable bowel   . PMR (polymyalgia rheumatica) (HCC)     a. On chronic prednisone.  Marland Kitchen Spinal stenosis   . Hypothyroidism   . Chronic kidney disease     Stage III/IV  . Hyperlipidemia   . Cataract   . Anxiety   . Osteoporosis   . Symptomatic bradycardia     a. Adm 03/2013 - taken off clonidine and BB.  Marland Kitchen Premature atrial contractions     a. Noted during adm 03/2013, corresponding with patient's palpitations.  . Aortic stenosis     a. Mild AS by echo 03/2013.  Marland Kitchen Hypokalemia     Medications:  See electronic med rec  Assessment: 89 YOF initially started on Vancomycin and Zosyn for sepsis by ED MD. Admitting MD changing to Unasyn for intra-abd infection. Tmax 101.9. WBC wnl. LA 3.42. SCr 1.6, est CrCl 23 ml/min.  Goal of Therapy:   Resolution of infection  Plan:  -Unasyn 1.5gm IV q12h -Will f/u renal function, micro data, and pt's clinical condition  Sherlon Handing, PharmD, BCPS Clinical pharmacist, pager [phone] 12/17/2014 3:14 AM

## 2014-12-17 NOTE — ED Notes (Signed)
Reported to PT and family that a standard hospital bed for comfort  Has been ordered.

## 2014-12-17 NOTE — Progress Notes (Signed)
Patient seen and examined, 81 year old admitted for myalgias, gastroenteritis, dehydration with elevated lactic acid Patient given empiric antibiotics Received Unasyn, Zosyn, vancomycin Anti-infectives    Start     Dose/Rate Route Frequency Ordered Stop   12/17/14 0845  metroNIDAZOLE (FLAGYL) IVPB 500 mg     500 mg 100 mL/hr over 60 Minutes Intravenous Every 8 hours 12/17/14 0831     12/17/14 0845  cefTRIAXone (ROCEPHIN) 1 g in dextrose 5 % 50 mL IVPB     1 g 100 mL/hr over 30 Minutes Intravenous Every 24 hours 12/17/14 0831     12/17/14 0400  ampicillin-sulbactam (UNASYN) 1.5 g in sodium chloride 0.9 % 50 mL IVPB  Status:  Discontinued     1.5 g 100 mL/hr over 30 Minutes Intravenous Every 12 hours 12/17/14 0315 12/17/14 0829   12/17/14 0215  ampicillin-sulbactam (UNASYN) 1.5 g in sodium chloride 0.9 % 50 mL IVPB  Status:  Discontinued     1.5 g 100 mL/hr over 30 Minutes Intravenous  Once 12/17/14 0202 12/17/14 0345   12/16/14 2130  vancomycin (VANCOCIN) 1,250 mg in sodium chloride 0.9 % 250 mL IVPB     1,250 mg 166.7 mL/hr over 90 Minutes Intravenous  Once 12/16/14 2115 12/17/14 0025   12/16/14 2115  piperacillin-tazobactam (ZOSYN) IVPB 3.375 g     3.375 g 100 mL/hr over 30 Minutes Intravenous  Once 12/16/14 2109 12/16/14 2203   12/16/14 2115  vancomycin (VANCOCIN) IVPB 1000 mg/200 mL premix  Status:  Discontinued     1,000 mg 200 mL/hr over 60 Minutes Intravenous  Once 12/16/14 2109 12/16/14 2115      Plan Follow blood cultures  Discontinue Unasyn, switched to Rocephin and Flagyl [patient allergic to quinolones]  Replete potassium  Pt/ot , social work consult

## 2014-12-18 ENCOUNTER — Encounter (HOSPITAL_COMMUNITY): Payer: Self-pay | Admitting: Gastroenterology

## 2014-12-18 DIAGNOSIS — K529 Noninfective gastroenteritis and colitis, unspecified: Secondary | ICD-10-CM

## 2014-12-18 DIAGNOSIS — F1920 Other psychoactive substance dependence, uncomplicated: Secondary | ICD-10-CM

## 2014-12-18 DIAGNOSIS — A419 Sepsis, unspecified organism: Principal | ICD-10-CM

## 2014-12-18 DIAGNOSIS — N182 Chronic kidney disease, stage 2 (mild): Secondary | ICD-10-CM

## 2014-12-18 LAB — CBC
HEMATOCRIT: 32.4 % — AB (ref 36.0–46.0)
HEMOGLOBIN: 10.5 g/dL — AB (ref 12.0–15.0)
MCH: 28.8 pg (ref 26.0–34.0)
MCHC: 32.4 g/dL (ref 30.0–36.0)
MCV: 89 fL (ref 78.0–100.0)
Platelets: 138 10*3/uL — ABNORMAL LOW (ref 150–400)
RBC: 3.64 MIL/uL — ABNORMAL LOW (ref 3.87–5.11)
RDW: 15 % (ref 11.5–15.5)
WBC: 10.3 10*3/uL (ref 4.0–10.5)

## 2014-12-18 LAB — COMPREHENSIVE METABOLIC PANEL
ALBUMIN: 2.6 g/dL — AB (ref 3.5–5.0)
ALK PHOS: 47 U/L (ref 38–126)
ALT: 15 U/L (ref 14–54)
AST: 26 U/L (ref 15–41)
Anion gap: 7 (ref 5–15)
BILIRUBIN TOTAL: 0.5 mg/dL (ref 0.3–1.2)
BUN: 24 mg/dL — AB (ref 6–20)
CALCIUM: 7.9 mg/dL — AB (ref 8.9–10.3)
CO2: 23 mmol/L (ref 22–32)
CREATININE: 1.23 mg/dL — AB (ref 0.44–1.00)
Chloride: 112 mmol/L — ABNORMAL HIGH (ref 101–111)
GFR calc Af Amer: 42 mL/min — ABNORMAL LOW (ref >60)
GFR calc non Af Amer: 36 mL/min — ABNORMAL LOW (ref >60)
GLUCOSE: 100 mg/dL — AB (ref 65–99)
POTASSIUM: 3.6 mmol/L (ref 3.5–5.1)
Sodium: 142 mmol/L (ref 135–145)
TOTAL PROTEIN: 5 g/dL — AB (ref 6.5–8.1)

## 2014-12-18 LAB — URINE CULTURE: Culture: 3000

## 2014-12-18 MED ORDER — SACCHAROMYCES BOULARDII 250 MG PO CAPS
250.0000 mg | ORAL_CAPSULE | Freq: Two times a day (BID) | ORAL | Status: DC
  Administered 2014-12-18 – 2014-12-21 (×7): 250 mg via ORAL
  Filled 2014-12-18 (×7): qty 1

## 2014-12-18 NOTE — Progress Notes (Signed)
[facility]                                                                              Patient Demographics  Christina Cabrera, is a 81 y.o. female, DOB - 08/02/1917, [place] date - 12/16/2014   Admitting Physician Etta Quill, DO  Outpatient Primary MD for the patient is Gennette Pac, MD  LOS - 1   Chief Complaint  Patient presents with  . Emesis       Brief HPI   Christina Cabrera is a 81 y.o. female who presented to the ED with generalized body aches, abdominal pain and tenderness, nausea and vomiting. No treatment prior to coming to ED. Work up in ED is suggestive of enteritis on CT scan with fever of 101.9, hypotensive which improved with IVF. Patient was admitted to stepdown for further workup.   Assessment & Plan    Principal Problem:   Sepsis [facility]): Source likely gastroenteritis, dehydration with elevated lactic acid, ? UTI - Currently improving, no leukocytosis, per patient diarrhea improving - CT of the abdomen and pelvis showed enteritis -  will check GI pathogen panel, stool cultures, fecal lactoferrin - CDiff toxin negative but antigen positive, likely colonization, usually does not require any treatment. However patient has symptoms of enteritis, hence will continue IV Flagyl, transition to oral Flagyl at the time of discharge. Added probiotic. - Urine culture showed no significant growth, discontinue IV Rocephin -Follow blood cultures  Active Problems: Dehydration with elevated lactic acid - Significant improved, patient now tolerating regular diet    Acute on Chronic kidney disease, stage III/IV: Likely due to significant dehydration from diarrhea, dehydration - Currently creatinine function is improving - Continue to hold Lasix  Hypertension - Currently BP is improving, holding Lasix, hydralazine, amlodipine   Generalized debility - PTOT evaluation-> no PT follow   History of PMR polymyalgia rheumatica-  Corticosteroid dependence (HCC) - Continue oral prednisone  Hypothyroidism Continue Synthroid, check TSH  Code Status: DNR  family Communication: Discussed in detail with the patient, all imaging results, lab results explained to the patient and daughter    Disposition Plan:   Time Spent in minutes   29mins  Procedures  CT abdomen    Consults  gastroenterology    DVT Prophylaxis   heparin  Medications  Scheduled Meds: . ALPRAZolam  0.25 mg Oral QID  . cefTRIAXone (ROCEPHIN)  IV  2 g Intravenous Q24H  . heparin  5,000 Units Subcutaneous 3 times per day  . levothyroxine  50 mcg Oral QAC breakfast  . metoCLOPramide (REGLAN) injection  5 mg Intravenous Once  . metronidazole  500 mg Intravenous Q8H  . pantoprazole  40 mg Oral Daily  . predniSONE  6 mg Oral Q breakfast  . sertraline  25 mg Oral QHS  . sodium chloride  3 mL Intravenous Q12H   Continuous Infusions: . 0.9 % sodium chloride with kcl 75 mL/hr at 12/18/14 0142   PRN Meds:.HYDROcodone-acetaminophen, ondansetron (ZOFRAN) IV   Antibiotics   Anti-infectives    Start     Dose/Rate Route Frequency Ordered Stop   12/18/14 1500  cefTRIAXone (  ROCEPHIN) 2 g in dextrose 5 % 50 mL IVPB     2 g 100 mL/hr over 30 Minutes Intravenous Every 24 hours 12/17/14 1431     12/17/14 1445  cefTRIAXone (ROCEPHIN) 1 g in dextrose 5 % 50 mL IVPB     1 g 100 mL/hr over 30 Minutes Intravenous  Once 12/17/14 1431 12/17/14 1651   12/17/14 0845  metroNIDAZOLE (FLAGYL) IVPB 500 mg     500 mg 100 mL/hr over 60 Minutes Intravenous Every 8 hours 12/17/14 0831     12/17/14 0845  cefTRIAXone (ROCEPHIN) 1 g in dextrose 5 % 50 mL IVPB  Status:  Discontinued     1 g 100 mL/hr over 30 Minutes Intravenous Every 24 hours 12/17/14 0831 12/17/14 1430   12/17/14 0400  ampicillin-sulbactam (UNASYN) 1.5 g in sodium chloride 0.9 % 50 mL IVPB  Status:  Discontinued     1.5 g 100 mL/hr over 30 Minutes Intravenous Every 12 hours 12/17/14 0315 12/17/14  0829   12/17/14 0215  ampicillin-sulbactam (UNASYN) 1.5 g in sodium chloride 0.9 % 50 mL IVPB  Status:  Discontinued     1.5 g 100 mL/hr over 30 Minutes Intravenous  Once 12/17/14 0202 12/17/14 0345   12/16/14 2130  vancomycin (VANCOCIN) 1,250 mg in sodium chloride 0.9 % 250 mL IVPB     1,250 mg 166.7 mL/hr over 90 Minutes Intravenous  Once 12/16/14 2115 12/17/14 0025   12/16/14 2115  piperacillin-tazobactam (ZOSYN) IVPB 3.375 g     3.375 g 100 mL/hr over 30 Minutes Intravenous  Once 12/16/14 2109 12/16/14 2203   12/16/14 2115  vancomycin (VANCOCIN) IVPB 1000 mg/200 mL premix  Status:  Discontinued     1,000 mg 200 mL/hr over 60 Minutes Intravenous  Once 12/16/14 2109 12/16/14 2115        Subjective:   Christina Cabrera was seen and examined today.  Patient denies dizziness, chest pain, shortness of breath. States diarrhea is improving. no new weakness, numbess, tingling. No acute events overnight.  No fevers or chills.  Objective:   Blood pressure 126/57, pulse 59, temperature 98.5 F (36.9 C), temperature source Oral, resp. rate 17, height 5\' 4"  (1.626 m), weight 63 kg (138 lb 14.2 oz), SpO2 91 %.  Wt Readings from Last 3 Encounters:  12/17/14 63 kg (138 lb 14.2 oz)  04/21/13 68.947 kg (152 lb)  04/04/13 69.7 kg (153 lb 10.6 oz)     Intake/Output Summary (Last 24 hours) at 12/18/14 1226 Last data filed at 12/18/14 0835  Gross per 24 hour  Intake 1793.75 ml  Output      0 ml  Net 1793.75 ml    Exam  General: Alert and oriented x 3, NAD  HEENT:  PERRLA, EOMI, Anicteric Sclera, mucous membranes moist.   Neck: Supple, no JVD, no masses  CVS: S1 S2 auscultated, no rubs, murmurs or gallops. Regular rate and rhythm.  Respiratory: Clear to auscultation bilaterally, no wheezing, rales or rhonchi  Abdomen: Soft,  mild generalized tenderness, nondistended, + bowel sounds  Ext: no cyanosis clubbing or edema  Neuro: AAOx3, Cr N's II- XII. Strength 5/5 upper and lower  extremities bilaterally  Skin: No rashes  Psych: Normal affect and demeanor, alert and oriented x3    Data Review   Micro Results Recent Results (from the past 240 hour(s))  Blood Culture (routine x 2)     Status: None (Preliminary result)   Collection Time: 12/16/14  9:47 PM  Result Value Ref  Range Status   Specimen Description BLOOD RIGHT ARM  Final   Special Requests BOTTLES DRAWN AEROBIC AND ANAEROBIC 5CC  Final   Culture NO GROWTH < 24 HOURS  Final   Report Status PENDING  Incomplete  Blood Culture (routine x 2)     Status: None (Preliminary result)   Collection Time: 12/16/14  9:47 PM  Result Value Ref Range Status   Specimen Description BLOOD RIGHT HAND  Final   Special Requests IN PEDIATRIC BOTTLE 2.5CC  Final   Culture NO GROWTH < 24 HOURS  Final   Report Status PENDING  Incomplete  Urine culture     Status: None   Collection Time: 12/16/14 11:46 PM  Result Value Ref Range Status   Specimen Description URINE, RANDOM  Final   Special Requests NONE  Final   Culture 3,000 COLONIES/mL INSIGNIFICANT GROWTH  Final   Report Status 12/18/2014 FINAL  Final  C difficile quick scan w PCR reflex     Status: Abnormal   Collection Time: 12/17/14  1:24 PM  Result Value Ref Range Status   C Diff antigen POSITIVE (A) NEGATIVE Final   C Diff toxin NEGATIVE NEGATIVE Final   C Diff interpretation   Final    C. difficile present, but toxin not detected. This indicates colonization. In most cases, this does not require treatment. If patient has signs and symptoms consistent with colitis, consider treatment. Requires ENTERIC precautions.  MRSA PCR Screening     Status: None   Collection Time: 12/17/14  2:28 PM  Result Value Ref Range Status   MRSA by PCR NEGATIVE NEGATIVE Final    Comment:        The GeneXpert MRSA Assay (FDA approved for NASAL specimens only), is one component of a comprehensive MRSA colonization surveillance program. It is not intended to diagnose MRSA infection  nor to guide or monitor treatment for MRSA infections.     Radiology Reports Ct Abdomen Pelvis Wo Contrast  12/17/2014  CLINICAL DATA:  Vomiting beginning at noon today. History of diverticulitis, irritable bowel, chronic kidney disease, hypertension. EXAM: CT ABDOMEN AND PELVIS WITHOUT CONTRAST TECHNIQUE: Multidetector CT imaging of the abdomen and pelvis was performed following the standard protocol without IV contrast. Oral contrast administered. COMPARISON:  CT abdomen pelvis August 18, 2013 FINDINGS: LUNG BASES: Similar to slightly larger small bilateral pleural effusions. Associated mild compressive atelectasis. Heart size is normal. Mild coronary artery calcifications. No pericardial effusions. KIDNEYS/BLADDER: Kidneys are orthotopic, demonstrating normal size and morphology. No nephrolithiasis, hydronephrosis; limited assessment for renal masses on this nonenhanced examination. The unopacified ureters are normal in course and caliber. Urinary bladder is partially distended and unremarkable. SOLID ORGANS: The liver is diffusely and mildly dense, which can be seen with amiodarone use, otherwise unremarkable. Spleen, pancreas and adrenal glands are unremarkable for this non-contrast examination. Status post cholecystectomy. GASTROINTESTINAL TRACT: Partially imaged moderate to large hiatal hernia. The stomach, small and large bowel are normal in course and caliber . Mild small bowel wall thickening. Moderate sigmoid diverticulosis. The appendix is not discretely identified, however there are no inflammatory changes in the right lower quadrant. PERITONEUM/RETROPERITONEUM: " Misty " mesentery with small reactive lymph nodes. Aortoiliac vessels are normal in course and caliber, moderate to severe calcific atherosclerosis. No lymphadenopathy by CT size criteria. Status post hysterectomy. No intraperitoneal free fluid nor free air. SOFT TISSUES/ OSSEOUS STRUCTURES: Nonsuspicious. Small fat containing umbilical  hernia. Severe thoracolumbar levoscoliosis. Osteopenia. Moderate chronic T11 compression fracture. IMPRESSION: Mild small bowel wall  thickening, which can be seen with enteritis. " Misty " mesentery and small lymph nodes are likely reactive. Partially imaged moderate to large hiatal hernia. Moderate sigmoid diverticulosis without acute diverticulitis. Similar to slightly larger pleural effusions. Electronically Signed   By: Elon Alas M.D.   On: 12/17/2014 01:39   Dg Chest Port 1 View  12/16/2014  CLINICAL DATA:  Dyspnea, cough and vomiting for 1 day EXAM: PORTABLE CHEST 1 VIEW COMPARISON:  04/02/2013 FINDINGS: There is moderate unchanged cardiomegaly. There is a large hiatal hernia. No confluent airspace consolidation. Minimal streaky atelectatic appearing opacities are present adjacent to the hiatal hernia. No large effusion. Pulmonary vasculature is normal. IMPRESSION: Cardiomegaly. Hiatal hernia. Mild atelectatic appearing basilar opacities on the left. Electronically Signed   By: Andreas Newport M.D.   On: 12/16/2014 23:35    CBC  Recent Labs Lab 12/16/14 2141 12/18/14 0458  WBC 7.3 10.3  HGB 13.3 10.5*  HCT 40.1 32.4*  PLT 144* 138*  MCV 86.2 89.0  MCH 28.6 28.8  MCHC 33.2 32.4  RDW 14.5 15.0  LYMPHSABS 0.8  --   MONOABS 0.6  --   EOSABS 0.0  --   BASOSABS 0.0  --     Chemistries   Recent Labs Lab 12/16/14 2141 12/17/14 0350 12/17/14 1027 12/18/14 0458  NA 142 142  --  142  K 3.3* 3.4*  --  3.6  CL 106 110  --  112*  CO2 22 24  --  23  GLUCOSE 128* 112*  --  100*  BUN 39* 37*  --  24*  CREATININE 1.60* 1.56*  --  1.23*  CALCIUM 8.8* 7.4*  --  7.9*  MG  --   --  1.9  --   AST 28  --   --  26  ALT 13*  --   --  15  ALKPHOS 59  --   --  47  BILITOT 0.8  --   --  0.5   ------------------------------------------------------------------------------------------------------------------ estimated creatinine clearance is 23.6 mL/min (by C-G formula based on Cr of  1.23). ------------------------------------------------------------------------------------------------------------------ No results for input(s): HGBA1C in the last 72 hours. ------------------------------------------------------------------------------------------------------------------ No results for input(s): CHOL, HDL, LDLCALC, TRIG, CHOLHDL, LDLDIRECT in the last 72 hours. ------------------------------------------------------------------------------------------------------------------ No results for input(s): TSH, T4TOTAL, T3FREE, THYROIDAB in the last 72 hours.  Invalid input(s): FREET3 ------------------------------------------------------------------------------------------------------------------ No results for input(s): VITAMINB12, FOLATE, FERRITIN, TIBC, IRON, RETICCTPCT in the last 72 hours.  Coagulation profile No results for input(s): INR, PROTIME in the last 168 hours.  No results for input(s): DDIMER in the last 72 hours.  Cardiac Enzymes No results for input(s): CKMB, TROPONINI, MYOGLOBIN in the last 168 hours.  Invalid input(s): CK ------------------------------------------------------------------------------------------------------------------ Invalid input(s): POCBNP  No results for input(s): GLUCAP in the last 72 hours.   RAI,RIPUDEEP M.D. [facility] 12/18/2014, 12:26 PM  Pager: [phone] Between 7am to 7pm - call Pager - [phone]  After 7pm go to [email or web address] - password [email or web address]  Call night coverage person covering after 7pm

## 2014-12-18 NOTE — Evaluation (Signed)
Physical Therapy Evaluation Patient Details Name: Christina Cabrera MRN: [id number] DOB: 1918-01-12 Today's Date: 12/18/2014   History of Present Illness  Christina Cabrera is a 81 y.o. female who presented to the ED with generalized body aches, abdominal pain and tenderness, nausea and vomiting.  Dx: gastroenteritis, myalgias, dehydration, elevated lactic acid. PMH includes arthritis, spinal stenosis, anxiety, osteoporosis.  Clinical Impression  Pt admitted with above diagnosis. Pt currently with functional limitations due to the deficits listed below (see PT Problem List). Christina Cabrera ambulated in hallway using RW w/ supervision but reports increased fatigue.  Pt encouraged to ambulate in hallway w/ nursing staff.  She will benefit from OT eval as she reports fatigue when performing ADLs.  Pt will benefit from skilled PT to increase their independence and safety with mobility to allow discharge to the venue listed below.      Follow Up Recommendations No PT follow up;Supervision for mobility/OOB    Equipment Recommendations  None recommended by PT    Recommendations for Other Services OT consult;Other (comment) (Pt requesting to see Dr. Watt Climes who was notified by this PT)     Precautions / Restrictions Precautions Precautions: Fall Precaution Comments: pt's last fall was over a year ago, slipped on soap in the shower Restrictions Weight Bearing Restrictions: No      Mobility  Bed Mobility               General bed mobility comments: Pt sitting on EOB eating breakfast upon PT arrival  Transfers Overall transfer level: Needs assistance Equipment used: Rolling walker (2 wheeled) Transfers: Sit to/from Stand Sit to Stand: Supervision         General transfer comment: Supervision for pt's safety.  No instability noted w/ use of RW and pt demonstrates safe technique.  Ambulation/Gait Ambulation/Gait assistance: Supervision Ambulation Distance (Feet): 250 Feet Assistive  device: Rolling walker (2 wheeled) Gait Pattern/deviations: Step-through pattern;Antalgic;Trunk flexed;Decreased stride length   Gait velocity interpretation: Below normal speed for age/gender General Gait Details: Pt requires cues to maintain body within RW when maneuvering in room.  She reports the RW feels less steady than her rollator she uses at home.    Stairs            Wheelchair Mobility    Modified Rankin (Stroke Patients Only)       Balance Overall balance assessment: Needs assistance;History of Falls Sitting-balance support: Feet supported Sitting balance-Leahy Scale: Good     Standing balance support: Bilateral upper extremity supported;During functional activity Standing balance-Leahy Scale: Poor Standing balance comment: Relies on RW for support                             Pertinent Vitals/Pain Pain Assessment: Faces Faces Pain Scale: Hurts even more Pain Location: Bil knees w/ MMT Pain Descriptors / Indicators: Grimacing;Guarding;Sharp Pain Intervention(s): Limited activity within patient's tolerance;Monitored during session;Repositioned    Home Living Family/patient expects to be discharged to:: Other (Comment) (Independent Living)                      Prior Function Level of Independence: Independent with assistive device(s)         Comments: Has been taking sponge baths on her own, Ind w/ all ADLs per pt.  Ind using rollator at baseline.     Hand Dominance        Extremity/Trunk Assessment   Upper Extremity Assessment: Generalized weakness  Lower Extremity Assessment: RLE deficits/detail;LLE deficits/detail RLE Deficits / Details: unable to test knee flexion/extension 2/2 arthritis pain, otherwise grossly 5/5 LLE Deficits / Details: unable to test knee flexion 2/2 arthritis pain, otherwise grossly 5/5  Cervical / Trunk Assessment: Kyphotic  Communication   Communication: No difficulties  Cognition  Arousal/Alertness: Awake/alert Behavior During Therapy: WFL for tasks assessed/performed Overall Cognitive Status: Within Functional Limits for tasks assessed                      General Comments General comments (skin integrity, edema, etc.): Had discussion w/ daughter and pt that upon initial return to [place] pt should have supervision from daughter if she is to attempt longer walk to cafeteria, both verbalize understanding.  Pt has option to have meals delivered to apartment.  Daughter able to provide supervision prn as she lives down the road.    Exercises General Exercises - Lower Extremity Ankle Circles/Pumps: AROM;Both;10 reps;Seated Hip Flexion/Marching: AROM;Both;10 reps;Seated Other Exercises Other Exercises: Pt encouraged to ambulate in hallway w/ nursing staff 3x/day to regain her strength      Assessment/Plan    PT Assessment Patient needs continued PT services  PT Diagnosis Difficulty walking;Generalized weakness   PT Problem List Decreased strength;Decreased range of motion;Decreased activity tolerance;Decreased balance;Decreased mobility;Decreased knowledge of use of DME;Decreased safety awareness;Decreased knowledge of precautions;Pain  PT Treatment Interventions DME instruction;Gait training;Functional mobility training;Therapeutic activities;Therapeutic exercise;Balance training;Neuromuscular re-education;Patient/family education   PT Goals (Current goals can be found in the Care Plan section) Acute Rehab PT Goals Patient Stated Goal: to get stronger PT Goal Formulation: With patient/family Time For Goal Achievement: 01/08/15 Potential to Achieve Goals: Good    Frequency Min 2X/week   Barriers to discharge        Co-evaluation               End of Session Equipment Utilized During Treatment: Gait belt Activity Tolerance: Patient tolerated treatment well;Patient limited by fatigue Patient left: in chair;with call bell/phone within  reach;with family/visitor present Nurse Communication: Mobility status         Time: [id number] PT Time Calculation (min) (ACUTE ONLY): 33 min   Charges:   PT Evaluation $Initial PT Evaluation Tier I: 1 Procedure PT Treatments $Gait Training: 8-22 mins   PT G CodesJoslyn Hy PT, DPT [phone] Pager: [phone] 12/18/2014, 10:03 AM

## 2014-12-18 NOTE — Consult Note (Signed)
Reason for Consult: Questionable C. difficile versus enteritis Referring Physician: Actually referred by patient's daughter  Christina Cabrera is an 81 y.o. female.  HPI: Hopefully you will not mind me seeing the patient but the daughter who knows me from taking care of her husband asked me to see her mother who is a patient of my partner Dr. Oletta Lamas and she says she has an acute illness that started all of a sudden with chills and muscle aches and no real change in her chronic abdominal pain which is due to chronic constipation and she is on chronic narcotics and she has not been having diarrhea until this started and she is feeling better today and we reviewed her labs stool studies and CT scan and discussed C. difficile and she does not remember the last time she was on antibiotics but it's been some time and she is eating a little bit today and has no new complaints and has been told she had intestinal ischemia which we also discussed and we answered all of her and her daughter's questions  Past Medical History  Diagnosis Date  . Hypertension   . Arthritis   . Diverticulitis   . Irritable bowel   . PMR (polymyalgia rheumatica) (HCC)     a. On chronic prednisone.  Marland Kitchen Spinal stenosis   . Hypothyroidism   . Chronic kidney disease     Stage III/IV  . Hyperlipidemia   . Cataract   . Anxiety   . Osteoporosis   . Symptomatic bradycardia     a. Adm 03/2013 - taken off clonidine and BB.  Marland Kitchen Premature atrial contractions     a. Noted during adm 03/2013, corresponding with patient's palpitations.  . Aortic stenosis     a. Mild AS by echo 03/2013.  Marland Kitchen Hypokalemia     Past Surgical History  Procedure Laterality Date  . Back surgery    . Appendectomy    . Cholecystectomy    . Eye surgery    . Abdominal hysterectomy      Family History  Problem Relation Age of Onset  . Hyperlipidemia Mother   . Hypertension Mother   . Hypertension Father   . Hyperlipidemia Father   . Hyperlipidemia Sister    . Hypertension Sister   . Diabetes Neg Hx   . Heart attack Neg Hx   . Sudden death Neg Hx     Social History:  reports that she has never smoked. She has never used smokeless tobacco. She reports that she does not drink alcohol or use illicit drugs.  Allergies:  Allergies  Allergen Reactions  . Codeine Nausea And Vomiting  . Fosamax [Alendronate Sodium]     Unknown.   Dion Saucier [Calcitonin]     Unknown.  . Nsaids     Due to renal function.   . Quinolones Other (See Comments)    platelets  . Sulfa Antibiotics Nausea And Vomiting    Medications: I have reviewed the patient's current medications.  Results for orders placed or performed during the hospital encounter of 12/16/14 (from the past 48 hour(s))  Comprehensive metabolic panel     Status: Abnormal   Collection Time: 12/16/14  9:41 PM  Result Value Ref Range   Sodium 142 135 - 145 mmol/L   Potassium 3.3 (L) 3.5 - 5.1 mmol/L   Chloride 106 101 - 111 mmol/L   CO2 22 22 - 32 mmol/L   Glucose, Bld 128 (H) 65 - 99 mg/dL  BUN 39 (H) 6 - 20 mg/dL   Creatinine, Ser 1.60 (H) 0.44 - 1.00 mg/dL   Calcium 8.8 (L) 8.9 - 10.3 mg/dL   Total Protein 6.0 (L) 6.5 - 8.1 g/dL   Albumin 3.6 3.5 - 5.0 g/dL   AST 28 15 - 41 U/L   ALT 13 (L) 14 - 54 U/L   Alkaline Phosphatase 59 38 - 126 U/L   Total Bilirubin 0.8 0.3 - 1.2 mg/dL   GFR calc non Af Amer 26 (L) >60 mL/min   GFR calc Af Amer 30 (L) >60 mL/min    Comment: (NOTE) The eGFR has been calculated using the CKD EPI equation. This calculation has not been validated in all clinical situations. eGFR's persistently <60 mL/min signify possible Chronic Kidney Disease.    Anion gap 14 5 - 15  CBC WITH DIFFERENTIAL     Status: Abnormal   Collection Time: 12/16/14  9:41 PM  Result Value Ref Range   WBC 7.3 4.0 - 10.5 K/uL   RBC 4.65 3.87 - 5.11 MIL/uL   Hemoglobin 13.3 12.0 - 15.0 g/dL   HCT 40.1 36.0 - 46.0 %   MCV 86.2 78.0 - 100.0 fL   MCH 28.6 26.0 - 34.0 pg   MCHC 33.2  30.0 - 36.0 g/dL   RDW 14.5 11.5 - 15.5 %   Platelets 144 (L) 150 - 400 K/uL   Neutrophils Relative % 81 %   Neutro Abs 6.0 1.7 - 7.7 K/uL   Lymphocytes Relative 11 %   Lymphs Abs 0.8 0.7 - 4.0 K/uL   Monocytes Relative 8 %   Monocytes Absolute 0.6 0.1 - 1.0 K/uL   Eosinophils Relative 0 %   Eosinophils Absolute 0.0 0.0 - 0.7 K/uL   Basophils Relative 0 %   Basophils Absolute 0.0 0.0 - 0.1 K/uL  Lipase, blood     Status: None   Collection Time: 12/16/14  9:41 PM  Result Value Ref Range   Lipase 34 11 - 51 U/L  Blood Culture (routine x 2)     Status: None (Preliminary result)   Collection Time: 12/16/14  9:47 PM  Result Value Ref Range   Specimen Description BLOOD RIGHT ARM    Special Requests BOTTLES DRAWN AEROBIC AND ANAEROBIC 5CC    Culture NO GROWTH < 24 HOURS    Report Status PENDING   Blood Culture (routine x 2)     Status: None (Preliminary result)   Collection Time: 12/16/14  9:47 PM  Result Value Ref Range   Specimen Description BLOOD RIGHT HAND    Special Requests IN PEDIATRIC BOTTLE 2.5CC    Culture NO GROWTH < 24 HOURS    Report Status PENDING   I-stat troponin, ED     Status: None   Collection Time: 12/16/14 10:00 PM  Result Value Ref Range   Troponin i, poc 0.01 0.00 - 0.08 ng/mL   Comment 3            Comment: Due to the release kinetics of cTnI, a negative result within the first hours of the onset of symptoms does not rule out myocardial infarction with certainty. If myocardial infarction is still suspected, repeat the test at appropriate intervals.   I-Stat CG4 Lactic Acid, ED  (not at  [facility])     Status: Abnormal   Collection Time: 12/16/14 10:02 PM  Result Value Ref Range   Lactic Acid, Venous 3.42 (HH) 0.5 - 2.0 mmol/L   Comment NOTIFIED PHYSICIAN  Urinalysis, Routine w reflex microscopic (not at [facility])     Status: Abnormal   Collection Time: 12/16/14 11:46 PM  Result Value Ref Range   Color, Urine YELLOW YELLOW   APPearance CLEAR CLEAR    Specific Gravity, Urine 1.011 1.005 - 1.030   pH 7.0 5.0 - 8.0   Glucose, UA NEGATIVE NEGATIVE mg/dL   Hgb urine dipstick NEGATIVE NEGATIVE   Bilirubin Urine NEGATIVE NEGATIVE   Ketones, ur NEGATIVE NEGATIVE mg/dL   Protein, ur NEGATIVE NEGATIVE mg/dL   Nitrite POSITIVE (A) NEGATIVE   Leukocytes, UA TRACE (A) NEGATIVE  Urine culture     Status: None (Preliminary result)   Collection Time: 12/16/14 11:46 PM  Result Value Ref Range   Specimen Description URINE, RANDOM    Special Requests NONE    Culture NO GROWTH < 12 HOURS    Report Status PENDING   Urine microscopic-add on     Status: Abnormal   Collection Time: 12/16/14 11:46 PM  Result Value Ref Range   Squamous Epithelial / LPF 0-5 (A) NONE SEEN   WBC, UA 6-30 0 - 5 WBC/hpf   RBC / HPF 6-30 0 - 5 RBC/hpf   Bacteria, UA FEW (A) NONE SEEN   Casts HYALINE CASTS (A) NEGATIVE  I-Stat CG4 Lactic Acid, ED  (not at  [facility])     Status: Abnormal   Collection Time: 12/17/14 12:05 AM  Result Value Ref Range   Lactic Acid, Venous 2.43 (HH) 0.5 - 2.0 mmol/L   Comment NOTIFIED PHYSICIAN   Cortisol     Status: None   Collection Time: 12/17/14  3:49 AM  Result Value Ref Range   Cortisol, Plasma 18.7 ug/dL    Comment: (NOTE) AM    6.7 - 22.6 ug/dL PM   <10.0       ug/dL   Basic metabolic panel     Status: Abnormal   Collection Time: 12/17/14  3:50 AM  Result Value Ref Range   Sodium 142 135 - 145 mmol/L   Potassium 3.4 (L) 3.5 - 5.1 mmol/L   Chloride 110 101 - 111 mmol/L   CO2 24 22 - 32 mmol/L   Glucose, Bld 112 (H) 65 - 99 mg/dL   BUN 37 (H) 6 - 20 mg/dL   Creatinine, Ser 1.56 (H) 0.44 - 1.00 mg/dL   Calcium 7.4 (L) 8.9 - 10.3 mg/dL   GFR calc non Af Amer 27 (L) >60 mL/min   GFR calc Af Amer 31 (L) >60 mL/min    Comment: (NOTE) The eGFR has been calculated using the CKD EPI equation. This calculation has not been validated in all clinical situations. eGFR's persistently <60 mL/min signify possible Chronic Kidney Disease.     Anion gap 8 5 - 15  Magnesium     Status: None   Collection Time: 12/17/14 10:27 AM  Result Value Ref Range   Magnesium 1.9 1.7 - 2.4 mg/dL  C difficile quick scan w PCR reflex     Status: Abnormal   Collection Time: 12/17/14  1:24 PM  Result Value Ref Range   C Diff antigen POSITIVE (A) NEGATIVE   C Diff toxin NEGATIVE NEGATIVE   C Diff interpretation      C. difficile present, but toxin not detected. This indicates colonization. In most cases, this does not require treatment. If patient has signs and symptoms consistent with colitis, consider treatment. Requires ENTERIC precautions.  MRSA PCR Screening     Status: None   Collection  Time: 12/17/14  2:28 PM  Result Value Ref Range   MRSA by PCR NEGATIVE NEGATIVE    Comment:        The GeneXpert MRSA Assay (FDA approved for NASAL specimens only), is one component of a comprehensive MRSA colonization surveillance program. It is not intended to diagnose MRSA infection nor to guide or monitor treatment for MRSA infections.   CBC     Status: Abnormal   Collection Time: 12/18/14  4:58 AM  Result Value Ref Range   WBC 10.3 4.0 - 10.5 K/uL   RBC 3.64 (L) 3.87 - 5.11 MIL/uL   Hemoglobin 10.5 (L) 12.0 - 15.0 g/dL    Comment: DELTA CHECK NOTED REPEATED TO VERIFY    HCT 32.4 (L) 36.0 - 46.0 %   MCV 89.0 78.0 - 100.0 fL   MCH 28.8 26.0 - 34.0 pg   MCHC 32.4 30.0 - 36.0 g/dL   RDW 15.0 11.5 - 15.5 %   Platelets 138 (L) 150 - 400 K/uL  Comprehensive metabolic panel     Status: Abnormal   Collection Time: 12/18/14  4:58 AM  Result Value Ref Range   Sodium 142 135 - 145 mmol/L   Potassium 3.6 3.5 - 5.1 mmol/L   Chloride 112 (H) 101 - 111 mmol/L   CO2 23 22 - 32 mmol/L   Glucose, Bld 100 (H) 65 - 99 mg/dL   BUN 24 (H) 6 - 20 mg/dL   Creatinine, Ser 1.23 (H) 0.44 - 1.00 mg/dL   Calcium 7.9 (L) 8.9 - 10.3 mg/dL   Total Protein 5.0 (L) 6.5 - 8.1 g/dL   Albumin 2.6 (L) 3.5 - 5.0 g/dL   AST 26 15 - 41 U/L   ALT 15 14 - 54 U/L    Alkaline Phosphatase 47 38 - 126 U/L   Total Bilirubin 0.5 0.3 - 1.2 mg/dL   GFR calc non Af Amer 36 (L) >60 mL/min   GFR calc Af Amer 42 (L) >60 mL/min    Comment: (NOTE) The eGFR has been calculated using the CKD EPI equation. This calculation has not been validated in all clinical situations. eGFR's persistently <60 mL/min signify possible Chronic Kidney Disease.    Anion gap 7 5 - 15    Ct Abdomen Pelvis Wo Contrast  12/17/2014  CLINICAL DATA:  Vomiting beginning at noon today. History of diverticulitis, irritable bowel, chronic kidney disease, hypertension. EXAM: CT ABDOMEN AND PELVIS WITHOUT CONTRAST TECHNIQUE: Multidetector CT imaging of the abdomen and pelvis was performed following the standard protocol without IV contrast. Oral contrast administered. COMPARISON:  CT abdomen pelvis August 18, 2013 FINDINGS: LUNG BASES: Similar to slightly larger small bilateral pleural effusions. Associated mild compressive atelectasis. Heart size is normal. Mild coronary artery calcifications. No pericardial effusions. KIDNEYS/BLADDER: Kidneys are orthotopic, demonstrating normal size and morphology. No nephrolithiasis, hydronephrosis; limited assessment for renal masses on this nonenhanced examination. The unopacified ureters are normal in course and caliber. Urinary bladder is partially distended and unremarkable. SOLID ORGANS: The liver is diffusely and mildly dense, which can be seen with amiodarone use, otherwise unremarkable. Spleen, pancreas and adrenal glands are unremarkable for this non-contrast examination. Status post cholecystectomy. GASTROINTESTINAL TRACT: Partially imaged moderate to large hiatal hernia. The stomach, small and large bowel are normal in course and caliber . Mild small bowel wall thickening. Moderate sigmoid diverticulosis. The appendix is not discretely identified, however there are no inflammatory changes in the right lower quadrant. PERITONEUM/RETROPERITONEUM: " Misty "  mesentery with small  reactive lymph nodes. Aortoiliac vessels are normal in course and caliber, moderate to severe calcific atherosclerosis. No lymphadenopathy by CT size criteria. Status post hysterectomy. No intraperitoneal free fluid nor free air. SOFT TISSUES/ OSSEOUS STRUCTURES: Nonsuspicious. Small fat containing umbilical hernia. Severe thoracolumbar levoscoliosis. Osteopenia. Moderate chronic T11 compression fracture. IMPRESSION: Mild small bowel wall thickening, which can be seen with enteritis. " Misty " mesentery and small lymph nodes are likely reactive. Partially imaged moderate to large hiatal hernia. Moderate sigmoid diverticulosis without acute diverticulitis. Similar to slightly larger pleural effusions. Electronically Signed   By: Elon Alas M.D.   On: 12/17/2014 01:39   Dg Chest Port 1 View  12/16/2014  CLINICAL DATA:  Dyspnea, cough and vomiting for 1 day EXAM: PORTABLE CHEST 1 VIEW COMPARISON:  04/02/2013 FINDINGS: There is moderate unchanged cardiomegaly. There is a large hiatal hernia. No confluent airspace consolidation. Minimal streaky atelectatic appearing opacities are present adjacent to the hiatal hernia. No large effusion. Pulmonary vasculature is normal. IMPRESSION: Cardiomegaly. Hiatal hernia. Mild atelectatic appearing basilar opacities on the left. Electronically Signed   By: Andreas Newport M.D.   On: 12/16/2014 23:35    ROS negative except above she is on chronic pain medicine due to her arthritis Blood pressure 144/53, pulse 60, temperature 98 F (36.7 C), temperature source Oral, resp. rate 16, height _0  (1.626 m), weight 63 kg (138 lb 14.2 oz), SpO2 97 %. Physical Exam vital signs stable afebrile no acute distress exam pertinent for her abdomen being a little firm positive bowel sounds no change in her chronic tenderness on palpation CT pertinent for probable enteritis without obvious colonic inflammation and C. difficile toxin positive with C. difficile  antigen negative  Assessment/Plan: Probable viral enteritis versus mild ischemia currently seemingly improving doubt significant  C. difficile with no colitis on CAT scan Plan: My partner Dr. Oletta Lamas hopefully will see tomorrow who is familiar with the patient but care with antibiotics and have a low threshold for stopping her Rocephin except for Flagyl otherwise will wait on cultures and follow her clinically and call us sooner when necessary  [facility] E 12/18/2014, 10:34 AM

## 2014-12-19 LAB — BASIC METABOLIC PANEL
Anion gap: 7 (ref 5–15)
BUN: 16 mg/dL (ref 6–20)
CALCIUM: 8.2 mg/dL — AB (ref 8.9–10.3)
CO2: 22 mmol/L (ref 22–32)
CREATININE: 1.26 mg/dL — AB (ref 0.44–1.00)
Chloride: 114 mmol/L — ABNORMAL HIGH (ref 101–111)
GFR calc Af Amer: 41 mL/min — ABNORMAL LOW (ref >60)
GFR, EST NON AFRICAN AMERICAN: 35 mL/min — AB (ref >60)
Glucose, Bld: 93 mg/dL (ref 65–99)
Potassium: 4.3 mmol/L (ref 3.5–5.1)
SODIUM: 143 mmol/L (ref 135–145)

## 2014-12-19 LAB — CBC
HCT: 33.2 % — ABNORMAL LOW (ref 36.0–46.0)
Hemoglobin: 10.6 g/dL — ABNORMAL LOW (ref 12.0–15.0)
MCH: 28.6 pg (ref 26.0–34.0)
MCHC: 31.9 g/dL (ref 30.0–36.0)
MCV: 89.7 fL (ref 78.0–100.0)
PLATELETS: 129 10*3/uL — AB (ref 150–400)
RBC: 3.7 MIL/uL — ABNORMAL LOW (ref 3.87–5.11)
RDW: 15.3 % (ref 11.5–15.5)
WBC: 7.9 10*3/uL (ref 4.0–10.5)

## 2014-12-19 LAB — MAGNESIUM: Magnesium: 1.8 mg/dL (ref 1.7–2.4)

## 2014-12-19 LAB — TSH: TSH: 3.652 u[IU]/mL (ref 0.350–4.500)

## 2014-12-19 MED ORDER — MAGNESIUM SULFATE 2 GM/50ML IV SOLN
INTRAVENOUS | Status: AC
  Filled 2014-12-19: qty 50

## 2014-12-19 MED ORDER — MAGNESIUM SULFATE 50 % IJ SOLN: 3.0000 g | Freq: Once | INTRAVENOUS | Status: DC

## 2014-12-19 MED ORDER — MAGNESIUM SULFATE 50 % IJ SOLN
3.0000 g | Freq: Once | INTRAVENOUS | Status: AC
  Administered 2014-12-19: 3 g via INTRAVENOUS
  Filled 2014-12-19: qty 6

## 2014-12-19 NOTE — Evaluation (Signed)
Clinical/Bedside Swallow Evaluation Patient Details  Name: Christina Cabrera MRN: [id number] Date of Birth: December 15, 1917  Today's Date: 12/19/2014 Time: SLP Start Time (ACUTE ONLY): 84 SLP Stop Time (ACUTE ONLY): 1619 SLP Time Calculation (min) (ACUTE ONLY): 16 min  Past Medical History:  Past Medical History  Diagnosis Date  . Hypertension   . Arthritis   . Diverticulitis   . Irritable bowel   . PMR (polymyalgia rheumatica) (HCC)     a. On chronic prednisone.  Marland Kitchen Spinal stenosis   . Hypothyroidism   . Chronic kidney disease     Stage III/IV  . Hyperlipidemia   . Cataract   . Anxiety   . Osteoporosis   . Symptomatic bradycardia     a. Adm 03/2013 - taken off clonidine and BB.  Marland Kitchen Premature atrial contractions     a. Noted during adm 03/2013, corresponding with patient's palpitations.  . Aortic stenosis     a. Mild AS by echo 03/2013.  Marland Kitchen Hypokalemia    Past Surgical History:  Past Surgical History  Procedure Laterality Date  . Back surgery    . Appendectomy    . Cholecystectomy    . Eye surgery    . Abdominal hysterectomy     HPI:  Christina Cabrera is a 81 y.o. female who presented to the ED with generalized body aches, abdominal pain and tenderness, nausea and vomiting.Dx with gastritis with C-diff. Hiatal hernia noted on CXR.    Assessment / Plan / Recommendation Clinical Impression  Patient presents with a primary esophageal dysphagia characterized by c/o midsternal chest pain with solid po intake, partially aleviated with liquid wash. Oropharyngeal swallow appearing WFL. No SLP f/u indicated. Defer f/u for GI symptoms to MD.     Aspiration Risk  Mild aspiration risk       Other  Recommendations Oral Care Recommendations: Oral care BID   Follow up Recommendations  None               Swallow Study   General HPI: Christina Cabrera is a 81 y.o. female who presented to the ED with generalized body aches, abdominal pain and tenderness, nausea and vomiting.Dx with  gastritis with C-diff. Hiatal hernia noted on CXR.  Type of Study: Bedside Swallow Evaluation Previous Swallow Assessment: none noted Diet Prior to this Study: Thin liquids (clear liquids) Temperature Spikes Noted: No Respiratory Status: Room air History of Recent Intubation: No Behavior/Cognition: Alert;Cooperative;Pleasant mood Oral Cavity Assessment: Within Functional Limits Oral Care Completed by SLP: No Oral Cavity - Dentition: Adequate natural dentition Vision: Functional for self-feeding Self-Feeding Abilities: Able to feed self Patient Positioning: Upright in bed Baseline Vocal Quality: Normal Volitional Cough: Strong Volitional Swallow: Able to elicit    Oral/Motor/Sensory Function Overall Oral Motor/Sensory Function: Within functional limits   Ice Chips Ice chips: Not tested   Thin Liquid Thin Liquid: Within functional limits Presentation: Cup;Self Fed;Straw    Nectar Thick Nectar Thick Liquid: Not tested   Honey Thick Honey Thick Liquid: Not tested   Puree Puree: Not tested   Solid Solid: Within functional limits Presentation: Self Fed      Alicia Seib MA, CCC-SLP [phone]  Christina Cabrera 12/19/2014,4:39 PM

## 2014-12-19 NOTE — Progress Notes (Signed)
[facility] PROGRESS NOTE Subjective patient clinically feels better. She had sudden onset of nausea vomiting and profuse watery diarrhea this past Friday. That morning she felt time. She is a previous nurse. She came back positive for C. difficile antigen but negative for toxin indicating that she has been colonized. G.I. pathogen panel, etc. are not yet back. CT scan showed no acute findings.  Objective: Vital signs in last 24 hours: Temp:  [97.7 F (36.5 C)-98.4 F (36.9 C)] 97.7 F (36.5 C) (12/05 1132) Pulse Rate:  [44-60] 44 (12/05 1132) Resp:  [12-22] 22 (12/05 1132) BP: (120-158)/(56-63) 137/62 mmHg (12/05 1132) SpO2:  [94 %-96 %] 96 % (12/05 1132) Weight:  [74.3 kg (163 lb 12.8 oz)] 74.3 kg (163 lb 12.8 oz) (12/05 1100) Last BM Date: 12/19/14  Intake/Output from previous day: 12/04 0701 - 12/05 0700 In: 1830 [P.O.:180; I.V.:1350; IV Piggyback:300] Out: -  Intake/Output this shift: Total I/O In: 100 [IV Piggyback:100] Out: -   PE: General-- alert and oriented in absolutely no distress  Abdomen-- soft nondistended nontender  Lab Results:  Recent Labs  12/16/14 2141 12/18/14 0458 12/19/14 0455  WBC 7.3 10.3 7.9  HGB 13.3 10.5* 10.6*  HCT 40.1 32.4* 33.2*  PLT 144* 138* 129*   BMET  Recent Labs  12/16/14 2141 12/17/14 0350 12/18/14 0458 12/19/14 0455  NA 142 142 142 143  K 3.3* 3.4* 3.6 4.3  CL 106 110 112* 114*  CO2 22 24 23 22   CREATININE 1.60* 1.56* 1.23* 1.26*   LFT  Recent Labs  12/16/14 2141 12/18/14 0458  PROT 6.0* 5.0*  AST 28 26  ALT 13* 15  ALKPHOS 59 47  BILITOT 0.8 0.5   PT/INR No results for input(s): LABPROT, INR in the last 72 hours. PANCREAS  Recent Labs  12/16/14 2141  LIPASE 34         Studies/Results: No results found.  Medications: I have reviewed the patient's current medications.  Assessment/Plan: 1. Acute gastroenteritis. Probably viral. Suspect the C. difficile antigen represents  colonization and not infection but would keep her on Flagyl for a couple days slowly advance diet. She does appear to be improving. We will follow.   Anacleto Batterman JR,Avant Printy L 12/19/2014, 12:30 PM  Pager: [phone] If no answer or after hours call [phone]

## 2014-12-19 NOTE — Progress Notes (Signed)
[facility]                                                                              Patient Demographics  Christina Cabrera, is a 81 y.o. female, DOB - [birth year], [place] date - 12/16/2014   Admitting Physician Etta Quill, DO  Outpatient Primary MD for the patient is Gennette Pac, MD  LOS - 2   Chief Complaint  Patient presents with  . Emesis       Brief HPI   Christina Cabrera is a 81 y.o. female who presented to the ED with generalized body aches, abdominal pain and tenderness, nausea and vomiting. No treatment prior to coming to ED. Work up in ED is suggestive of enteritis on CT scan with fever of 101.9, hypotensive which improved with IVF. Patient was admitted to stepdown for further workup.   Assessment & Plan    Principal Problem:   Sepsis [facility]): Source likely gastroenteritis, dehydration with elevated lactic acid, ? UTI - Currently improving, no leukocytosis, per patient diarrhea improving - CT of the abdomen and pelvis showed enteritis, GI pathogen panel, stool cultures, fecal lactoferrin still pending - CDiff toxin negative but antigen positive, likely colonization, usually does not require any treatment. However patient has symptoms of enteritis, hence will continue IV Flagyl, transition to oral Flagyl at the time of discharge. Added probiotic. - Urine culture showed no significant growth, discontinueD IV Rocephin -Follow blood cultures - Patient feels nauseous with the solid food, will downgrade to full liquid diet for today  Active Problems: Dehydration with elevated lactic acid - Significant improved, continue gentle hydration, diet downgraded to full liquids today    Acute on Chronic kidney disease, stage III/IV: Likely due to significant dehydration from diarrhea, dehydration - Currently creatinine function is improving - Continue to hold Lasix  Hypertension - BP is now elevated, will restart amlodipine,  hydralazine, continue to hold Lasix.   Generalized debility - PTOT evaluation-> no PT followup   History of PMR polymyalgia rheumatica- Corticosteroid dependence (HCC) - Continue oral prednisone  Hypothyroidism Continue Synthroid, TSH 3.6  Code Status: DNR  family Communication: Discussed in detail with the patient, all imaging results, lab results explained to the patient    Disposition Plan: Transfer to telemetry  Time Spent in minutes   48mins  Procedures  CT abdomen    Consults  gastroenterology    DVT Prophylaxis   heparin  Medications  Scheduled Meds: . ALPRAZolam  0.25 mg Oral QID  . heparin  5,000 Units Subcutaneous 3 times per day  . levothyroxine  50 mcg Oral QAC breakfast  . magnesium sulfate 1 - 4 g bolus IVPB  3 g Intravenous Once  . metoCLOPramide (REGLAN) injection  5 mg Intravenous Once  . metronidazole  500 mg Intravenous Q8H  . pantoprazole  40 mg Oral Daily  . predniSONE  6 mg Oral Q breakfast  . saccharomyces boulardii  250 mg Oral BID  . sertraline  25 mg Oral QHS  . sodium chloride  3 mL Intravenous Q12H   Continuous Infusions: . 0.9 % sodium chloride with kcl 75 mL/hr  at 12/18/14 2218   PRN Meds:.HYDROcodone-acetaminophen, ondansetron (ZOFRAN) IV   Antibiotics   Anti-infectives    Start     Dose/Rate Route Frequency Ordered Stop   12/18/14 1500  cefTRIAXone (ROCEPHIN) 2 g in dextrose 5 % 50 mL IVPB  Status:  Discontinued     2 g 100 mL/hr over 30 Minutes Intravenous Every 24 hours 12/17/14 1431 12/18/14 1230   12/17/14 1445  cefTRIAXone (ROCEPHIN) 1 g in dextrose 5 % 50 mL IVPB     1 g 100 mL/hr over 30 Minutes Intravenous  Once 12/17/14 1431 12/17/14 1651   12/17/14 0845  metroNIDAZOLE (FLAGYL) IVPB 500 mg     500 mg 100 mL/hr over 60 Minutes Intravenous Every 8 hours 12/17/14 0831     12/17/14 0845  cefTRIAXone (ROCEPHIN) 1 g in dextrose 5 % 50 mL IVPB  Status:  Discontinued     1 g 100 mL/hr over 30 Minutes Intravenous Every  24 hours 12/17/14 0831 12/17/14 1430   12/17/14 0400  ampicillin-sulbactam (UNASYN) 1.5 g in sodium chloride 0.9 % 50 mL IVPB  Status:  Discontinued     1.5 g 100 mL/hr over 30 Minutes Intravenous Every 12 hours 12/17/14 0315 12/17/14 0829   12/17/14 0215  ampicillin-sulbactam (UNASYN) 1.5 g in sodium chloride 0.9 % 50 mL IVPB  Status:  Discontinued     1.5 g 100 mL/hr over 30 Minutes Intravenous  Once 12/17/14 0202 12/17/14 0345   12/16/14 2130  vancomycin (VANCOCIN) 1,250 mg in sodium chloride 0.9 % 250 mL IVPB     1,250 mg 166.7 mL/hr over 90 Minutes Intravenous  Once 12/16/14 2115 12/17/14 0025   12/16/14 2115  piperacillin-tazobactam (ZOSYN) IVPB 3.375 g     3.375 g 100 mL/hr over 30 Minutes Intravenous  Once 12/16/14 2109 12/16/14 2203   12/16/14 2115  vancomycin (VANCOCIN) IVPB 1000 mg/200 mL premix  Status:  Discontinued     1,000 mg 200 mL/hr over 60 Minutes Intravenous  Once 12/16/14 2109 12/16/14 2115        Subjective:   Christina Cabrera was seen and examined today.  Feels better today, still feels nauseous with solid food. Dizziness, weakness improving. Diarrhea is improving. No chest pain or shortness of breath, fevers or chills. No acute events overnight.    Objective:   Blood pressure 158/58, pulse 58, temperature 98 F (36.7 C), temperature source Oral, resp. rate 18, height 5\' 4"  (1.626 m), weight 63 kg (138 lb 14.2 oz), SpO2 95 %.  Wt Readings from Last 3 Encounters:  12/17/14 63 kg (138 lb 14.2 oz)  04/21/13 68.947 kg (152 lb)  04/04/13 69.7 kg (153 lb 10.6 oz)     Intake/Output Summary (Last 24 hours) at 12/19/14 1110 Last data filed at 12/19/14 0816  Gross per 24 hour  Intake   1680 ml  Output      0 ml  Net   1680 ml    Exam  General: Alert and oriented x 3, NAD  HEENT:  PERRLA, EOMI, Anicteric Sclera, mucous membranes moist.   Neck: Supple, no JVD, no masses  CVS: S1 S2clear, RRR  Respiratory: CTAB  Abdomen: Soft,  mild generalized  tenderness, nondistended, + bowel sounds  Ext: no cyanosis clubbing or edema  Neuro: no new deficits  Skin: No rashes  Psych: Normal affect and demeanor, alert and oriented x3    Data Review   Micro Results Recent Results (from the past 240 hour(s))  Blood Culture (routine  x 2)     Status: None (Preliminary result)   Collection Time: 12/16/14  9:47 PM  Result Value Ref Range Status   Specimen Description BLOOD RIGHT ARM  Final   Special Requests BOTTLES DRAWN AEROBIC AND ANAEROBIC 5CC  Final   Culture NO GROWTH 2 DAYS  Final   Report Status PENDING  Incomplete  Blood Culture (routine x 2)     Status: None (Preliminary result)   Collection Time: 12/16/14  9:47 PM  Result Value Ref Range Status   Specimen Description BLOOD RIGHT HAND  Final   Special Requests IN PEDIATRIC BOTTLE 2.5CC  Final   Culture NO GROWTH 2 DAYS  Final   Report Status PENDING  Incomplete  Urine culture     Status: None   Collection Time: 12/16/14 11:46 PM  Result Value Ref Range Status   Specimen Description URINE, RANDOM  Final   Special Requests NONE  Final   Culture 3,000 COLONIES/mL INSIGNIFICANT GROWTH  Final   Report Status 12/18/2014 FINAL  Final  C difficile quick scan w PCR reflex     Status: Abnormal   Collection Time: 12/17/14  1:24 PM  Result Value Ref Range Status   C Diff antigen POSITIVE (A) NEGATIVE Final   C Diff toxin NEGATIVE NEGATIVE Final   C Diff interpretation   Final    C. difficile present, but toxin not detected. This indicates colonization. In most cases, this does not require treatment. If patient has signs and symptoms consistent with colitis, consider treatment. Requires ENTERIC precautions.  MRSA PCR Screening     Status: None   Collection Time: 12/17/14  2:28 PM  Result Value Ref Range Status   MRSA by PCR NEGATIVE NEGATIVE Final    Comment:        The GeneXpert MRSA Assay (FDA approved for NASAL specimens only), is one component of a comprehensive MRSA  colonization surveillance program. It is not intended to diagnose MRSA infection nor to guide or monitor treatment for MRSA infections.     Radiology Reports Ct Abdomen Pelvis Wo Contrast  12/17/2014  CLINICAL DATA:  Vomiting beginning at noon today. History of diverticulitis, irritable bowel, chronic kidney disease, hypertension. EXAM: CT ABDOMEN AND PELVIS WITHOUT CONTRAST TECHNIQUE: Multidetector CT imaging of the abdomen and pelvis was performed following the standard protocol without IV contrast. Oral contrast administered. COMPARISON:  CT abdomen pelvis August 18, 2013 FINDINGS: LUNG BASES: Similar to slightly larger small bilateral pleural effusions. Associated mild compressive atelectasis. Heart size is normal. Mild coronary artery calcifications. No pericardial effusions. KIDNEYS/BLADDER: Kidneys are orthotopic, demonstrating normal size and morphology. No nephrolithiasis, hydronephrosis; limited assessment for renal masses on this nonenhanced examination. The unopacified ureters are normal in course and caliber. Urinary bladder is partially distended and unremarkable. SOLID ORGANS: The liver is diffusely and mildly dense, which can be seen with amiodarone use, otherwise unremarkable. Spleen, pancreas and adrenal glands are unremarkable for this non-contrast examination. Status post cholecystectomy. GASTROINTESTINAL TRACT: Partially imaged moderate to large hiatal hernia. The stomach, small and large bowel are normal in course and caliber . Mild small bowel wall thickening. Moderate sigmoid diverticulosis. The appendix is not discretely identified, however there are no inflammatory changes in the right lower quadrant. PERITONEUM/RETROPERITONEUM: " Misty " mesentery with small reactive lymph nodes. Aortoiliac vessels are normal in course and caliber, moderate to severe calcific atherosclerosis. No lymphadenopathy by CT size criteria. Status post hysterectomy. No intraperitoneal free fluid nor free  air. SOFT TISSUES/ OSSEOUS STRUCTURES:  Nonsuspicious. Small fat containing umbilical hernia. Severe thoracolumbar levoscoliosis. Osteopenia. Moderate chronic T11 compression fracture. IMPRESSION: Mild small bowel wall thickening, which can be seen with enteritis. " Misty " mesentery and small lymph nodes are likely reactive. Partially imaged moderate to large hiatal hernia. Moderate sigmoid diverticulosis without acute diverticulitis. Similar to slightly larger pleural effusions. Electronically Signed   By: Elon Alas M.D.   On: 12/17/2014 01:39   Dg Chest Port 1 View  12/16/2014  CLINICAL DATA:  Dyspnea, cough and vomiting for 1 day EXAM: PORTABLE CHEST 1 VIEW COMPARISON:  04/02/2013 FINDINGS: There is moderate unchanged cardiomegaly. There is a large hiatal hernia. No confluent airspace consolidation. Minimal streaky atelectatic appearing opacities are present adjacent to the hiatal hernia. No large effusion. Pulmonary vasculature is normal. IMPRESSION: Cardiomegaly. Hiatal hernia. Mild atelectatic appearing basilar opacities on the left. Electronically Signed   By: Andreas Newport M.D.   On: 12/16/2014 23:35    CBC  Recent Labs Lab 12/16/14 2141 12/18/14 0458 12/19/14 0455  WBC 7.3 10.3 7.9  HGB 13.3 10.5* 10.6*  HCT 40.1 32.4* 33.2*  PLT 144* 138* 129*  MCV 86.2 89.0 89.7  MCH 28.6 28.8 28.6  MCHC 33.2 32.4 31.9  RDW 14.5 15.0 15.3  LYMPHSABS 0.8  --   --   MONOABS 0.6  --   --   EOSABS 0.0  --   --   BASOSABS 0.0  --   --     Chemistries   Recent Labs Lab 12/16/14 2141 12/17/14 0350 12/17/14 1027 12/18/14 0458 12/19/14 0455  NA 142 142  --  142 143  K 3.3* 3.4*  --  3.6 4.3  CL 106 110  --  112* 114*  CO2 22 24  --  23 22  GLUCOSE 128* 112*  --  100* 93  BUN 39* 37*  --  24* 16  CREATININE 1.60* 1.56*  --  1.23* 1.26*  CALCIUM 8.8* 7.4*  --  7.9* 8.2*  MG  --   --  1.9  --  1.8  AST 28  --   --  26  --   ALT 13*  --   --  15  --   ALKPHOS 59  --   --  47   --   BILITOT 0.8  --   --  0.5  --    ------------------------------------------------------------------------------------------------------------------ estimated creatinine clearance is 23.1 mL/min (by C-G formula based on Cr of 1.26). ------------------------------------------------------------------------------------------------------------------ No results for input(s): HGBA1C in the last 72 hours. ------------------------------------------------------------------------------------------------------------------ No results for input(s): CHOL, HDL, LDLCALC, TRIG, CHOLHDL, LDLDIRECT in the last 72 hours. ------------------------------------------------------------------------------------------------------------------  Recent Labs  12/19/14 0455  TSH 3.652   ------------------------------------------------------------------------------------------------------------------ No results for input(s): VITAMINB12, FOLATE, FERRITIN, TIBC, IRON, RETICCTPCT in the last 72 hours.  Coagulation profile No results for input(s): INR, PROTIME in the last 168 hours.  No results for input(s): DDIMER in the last 72 hours.  Cardiac Enzymes No results for input(s): CKMB, TROPONINI, MYOGLOBIN in the last 168 hours.  Invalid input(s): CK ------------------------------------------------------------------------------------------------------------------ Invalid input(s): POCBNP  No results for input(s): GLUCAP in the last 72 hours.   RAI,RIPUDEEP M.D. [facility] 12/19/2014, 11:10 AM  Pager: [phone] Between 7am to 7pm - call Pager - [phone]  After 7pm go to [email or web address] - password [email or web address]  Call night coverage person covering after 7pm

## 2014-12-19 NOTE — Progress Notes (Signed)
OT Cancellation Note  Patient Details Name: Christina Cabrera MRN: [id number] DOB: 04-Jun-1917   Cancelled Treatment:    Reason Eval/Treat Not Completed: Patient at procedure or test/ unavailable  - pt with another discipline.  Will reattempt.    Darlina Rumpf [place], OTR/L [id number]  12/19/2014, 2:22 PM

## 2014-12-19 NOTE — Progress Notes (Signed)
Report called to [facility], receiving RN on  [place]. VSS. Transferred to 5w06 via wheelchair with personal belongings. Patients daughter at bedside.  [facility]

## 2014-12-19 NOTE — Progress Notes (Signed)
Initial Nutrition Assessment  DOCUMENTATION CODES:   Not applicable  INTERVENTION:   -K[employer]ack pudding TID  NUTRITION DIAGNOSIS:   Inadequate oral intake related to altered GI function as evidenced by per patient/family report.  GOAL:   Patient will meet greater than or equal to 90% of their needs  MONITOR:   PO intake, Diet advancement, Labs, Weight trends, Skin, I & O's  REASON FOR ASSESSMENT:   Consult Poor PO  ASSESSMENT:   Christina Cabrera is a 81 y.o. female who presents to the ED with generalized body aches, abdominal pain and tenderness, nausea and vomiting. No treatment prior to coming to ED. Brought to ED by EMS, nothing makes symptoms better or worse.  Pt admitted admitted with sepsis and viral gastroenteritis.   Hx obtained from pt and pt daughter at bedside. Pt has been a resident of [place] for the past 16 years. She describes her appetite as poor over the past several years. She and pt daughter confirm that she receives 3 meals per day in the dining room and ot walks to dining room with assistance of walker without difficulty. Pt reports intake has been poor more recently as dining room have been provider "very flavorful foods" (ex. [place], [place]) that she does not particularly care for. She also complains of intermittent swallowing difficulties due to a stricture that has been diagnosed within the past year.   Pt endorses a 30# wt loss over the past year, however, this is inconsistent with wt hx. UBW of 160# has been stable over the past > 1 year.   Pt was consuming some tomato soup at time of visit. Pt was tolerating well, but eating slowly. She reports diet was downgraded earlier today, due to difficulty tolerating solid foods ("I think that's just too much for me right now"). Discussed importance of good nutritional intake to promote healing. Offered addition of nutritional supplements, however, pt politely declined. She  expressed that she would rather receive nutrition via meal trays.   Nutrition-Focused physical exam completed. Findings are no fat depletion, no muscle depletion, and no edema.   Labs reviewed.   Diet Order:  Diet full liquid Room service appropriate?: Yes; Fluid consistency:: Thin  Skin:  Reviewed, no issues  Last BM:  12/19/14  Height:   Ht Readings from Last 1 Encounters:  12/19/14 5\' 4"  (1.626 m)    Weight:   Wt Readings from Last 1 Encounters:  12/19/14 163 lb 12.8 oz (74.3 kg)    Ideal Body Weight:  54.5 kg  BMI:  Body mass index is 28.1 kg/(m^2).  Estimated Nutritional Needs:   Kcal:  1400-1600  Protein:  60-70 grams  Fluid:  1.4-1.6 L  EDUCATION NEEDS:   Education needs addressed  Mitsuko Luera A. Jimmye Norman, RD, LDN, CDE Pager: [phone] After hours Pager: [phone]

## 2014-12-20 LAB — CBC
HCT: 32.4 % — ABNORMAL LOW (ref 36.0–46.0)
Hemoglobin: 10.4 g/dL — ABNORMAL LOW (ref 12.0–15.0)
MCH: 28.7 pg (ref 26.0–34.0)
MCHC: 32.1 g/dL (ref 30.0–36.0)
MCV: 89.5 fL (ref 78.0–100.0)
PLATELETS: 128 10*3/uL — AB (ref 150–400)
RBC: 3.62 MIL/uL — AB (ref 3.87–5.11)
RDW: 15 % (ref 11.5–15.5)
WBC: 5.8 10*3/uL (ref 4.0–10.5)

## 2014-12-20 LAB — BASIC METABOLIC PANEL
Anion gap: 4 — ABNORMAL LOW (ref 5–15)
BUN: 13 mg/dL (ref 6–20)
CALCIUM: 8.4 mg/dL — AB (ref 8.9–10.3)
CO2: 22 mmol/L (ref 22–32)
CREATININE: 1.11 mg/dL — AB (ref 0.44–1.00)
Chloride: 115 mmol/L — ABNORMAL HIGH (ref 101–111)
GFR calc non Af Amer: 41 mL/min — ABNORMAL LOW (ref >60)
GFR, EST AFRICAN AMERICAN: 47 mL/min — AB (ref >60)
GLUCOSE: 90 mg/dL (ref 65–99)
Potassium: 4.9 mmol/L (ref 3.5–5.1)
Sodium: 141 mmol/L (ref 135–145)

## 2014-12-20 MED ORDER — FUROSEMIDE 40 MG PO TABS: 40.0000 mg | ORAL_TABLET | Freq: Every morning | ORAL | Status: DC

## 2014-12-20 MED ORDER — METRONIDAZOLE 500 MG PO TABS
500.0000 mg | ORAL_TABLET | Freq: Three times a day (TID) | ORAL | Status: DC
  Administered 2014-12-20 – 2014-12-21 (×4): 500 mg via ORAL
  Filled 2014-12-20 (×4): qty 1

## 2014-12-20 MED ORDER — FUROSEMIDE 40 MG PO TABS
40.0000 mg | ORAL_TABLET | Freq: Every morning | ORAL | Status: DC
  Administered 2014-12-20 – 2014-12-21 (×2): 40 mg via ORAL
  Filled 2014-12-20 (×2): qty 1

## 2014-12-20 NOTE — Progress Notes (Signed)
[facility] PROGRESS NOTE Subjective Pt reports the stools firming up tolerating diet.  Objective: Vital signs in last 24 hours: Temp:  [97.6 F (36.4 C)-97.8 F (36.6 C)] 97.6 F (36.4 C) (12/06 0601) Pulse Rate:  [54-55] 55 (12/06 0601) Resp:  [18-22] 18 (12/06 0601) BP: (130-154)/(54-67) 149/67 mmHg (12/06 0601) SpO2:  [92 %-95 %] 95 % (12/06 0601) Last BM Date: 12/20/14  Intake/Output from previous day: 12/05 0701 - 12/06 0700 In: 2810.8 [P.O.:222; I.V.:2288.8; IV Piggyback:300] Out: -  Intake/Output this shift: Total I/O In: 57.5 [I.V.:57.5] Out: -   PE: General--alert no distress - Abdomen--soft nontender  Lab Results:  Recent Labs  12/18/14 0458 12/19/14 0455 12/20/14 0522  WBC 10.3 7.9 5.8  HGB 10.5* 10.6* 10.4*  HCT 32.4* 33.2* 32.4*  PLT 138* 129* 128*   BMET  Recent Labs  12/18/14 0458 12/19/14 0455 12/20/14 0522  NA 142 143 141  K 3.6 4.3 4.9  CL 112* 114* 115*  CO2 23 22 22   CREATININE 1.23* 1.26* 1.11*   LFT  Recent Labs  12/18/14 0458  PROT 5.0*  AST 26  ALT 15  ALKPHOS 47  BILITOT 0.5   PT/INR No results for input(s): LABPROT, INR in the last 72 hours. PANCREAS No results for input(s): LIPASE in the last 72 hours.       Studies/Results: No results found.  Medications: I have reviewed the patient's current medications.  Assessment/Plan: 1. Acute Diarrhea. Probably infectious appears to be improving, GI path panel still pending   Marcia Hartwell JR,Loreta Blouch L 12/20/2014, 12:41 PM  Pager: [phone] If no answer or after hours call [phone]

## 2014-12-20 NOTE — Progress Notes (Signed)
[facility]                                                                              Patient Demographics  Christina Cabrera, is a 81 y.o. female, DOB - 08/24/1917, [place] date - 12/16/2014   Admitting Physician Etta Quill, DO  Outpatient Primary MD for the patient is Gennette Pac, MD  LOS - 3   Chief Complaint  Patient presents with  . Emesis       Brief HPI   [name] is a 81 y.o. female who presented to the ED with generalized body aches, abdominal pain and tenderness, nausea and vomiting. No treatment prior to coming to ED. Work up in ED is suggestive of enteritis on CT scan with fever of 101.9, hypotensive which improved with IVF. Patient was admitted to stepdown for further workup.   Assessment & Plan    Principal Problem:   Sepsis [facility]): Source likely gastroenteritis, dehydration with elevated lactic acid, ? UTI - Currently improving, no leukocytosis, per patient diarrhea improving - CT of the abdomen and pelvis showed enteritis, GI pathogen panel, stool cultures, fecal lactoferrin still pending - CDiff toxin negative but antigen positive, likely colonization, usually does not require any treatment. However patient has symptoms of enteritis, hence will continue IV Flagyl, transition to oral Flagyl at the time of discharge. Added probiotic. - Urine culture showed no significant growth, discontinued IV Rocephin - Blood cultures negative so far - Advance diet to solids  Active Problems: Dehydration with elevated lactic acid - Resolved, noticing peripheral edema, restarted patient's home dose of Lasix    Acute on Chronic kidney disease, stage III/IV: Likely due to significant dehydration from diarrhea, dehydration - Resolved with IV fluid hydration  Hypertension - Continue amlodipine, hydralazine, restarted Lasix  Generalized debility - PTOT evaluation-> no PT followup   History of PMR polymyalgia rheumatica-  Corticosteroid dependence (HCC) - Continue oral prednisone  Hypothyroidism Continue Synthroid, TSH 3.6  Code Status: DNR  family Communication: Discussed in detail with the patient, all imaging results, lab results explained to the patient and the patient's daughter   Disposition Plan: DC home in a.m. if tolerating solid diet  Time Spent in minutes   45mins  Procedures  CT abdomen    Consults  gastroenterology    DVT Prophylaxis   heparin  Medications  Scheduled Meds: . ALPRAZolam  0.25 mg Oral QID  . furosemide  40 mg Oral q morning - 10a  . heparin  5,000 Units Subcutaneous 3 times per day  . levothyroxine  50 mcg Oral QAC breakfast  . metoCLOPramide (REGLAN) injection  5 mg Intravenous Once  . metroNIDAZOLE  500 mg Oral 3 times per day  . pantoprazole  40 mg Oral Daily  . predniSONE  6 mg Oral Q breakfast  . saccharomyces boulardii  250 mg Oral BID  . sertraline  25 mg Oral QHS  . sodium chloride  3 mL Intravenous Q12H   Continuous Infusions: . 0.9 % sodium chloride with kcl 75 mL/hr at 12/19/14 2055   PRN Meds:.HYDROcodone-acetaminophen, ondansetron (ZOFRAN) IV   Antibiotics  Anti-infectives    Start     Dose/Rate Route Frequency Ordered Stop   12/20/14 1400  metroNIDAZOLE (FLAGYL) tablet 500 mg     500 mg Oral 3 times per day 12/20/14 1153     12/18/14 1500  cefTRIAXone (ROCEPHIN) 2 g in dextrose 5 % 50 mL IVPB  Status:  Discontinued     2 g 100 mL/hr over 30 Minutes Intravenous Every 24 hours 12/17/14 1431 12/18/14 1230   12/17/14 1445  cefTRIAXone (ROCEPHIN) 1 g in dextrose 5 % 50 mL IVPB     1 g 100 mL/hr over 30 Minutes Intravenous  Once 12/17/14 1431 12/17/14 1651   12/17/14 0845  metroNIDAZOLE (FLAGYL) IVPB 500 mg  Status:  Discontinued     500 mg 100 mL/hr over 60 Minutes Intravenous Every 8 hours 12/17/14 0831 12/20/14 1153   12/17/14 0845  cefTRIAXone (ROCEPHIN) 1 g in dextrose 5 % 50 mL IVPB  Status:  Discontinued     1 g 100 mL/hr over  30 Minutes Intravenous Every 24 hours 12/17/14 0831 12/17/14 1430   12/17/14 0400  ampicillin-sulbactam (UNASYN) 1.5 g in sodium chloride 0.9 % 50 mL IVPB  Status:  Discontinued     1.5 g 100 mL/hr over 30 Minutes Intravenous Every 12 hours 12/17/14 0315 12/17/14 0829   12/17/14 0215  ampicillin-sulbactam (UNASYN) 1.5 g in sodium chloride 0.9 % 50 mL IVPB  Status:  Discontinued     1.5 g 100 mL/hr over 30 Minutes Intravenous  Once 12/17/14 0202 12/17/14 0345   12/16/14 2130  vancomycin (VANCOCIN) 1,250 mg in sodium chloride 0.9 % 250 mL IVPB     1,250 mg 166.7 mL/hr over 90 Minutes Intravenous  Once 12/16/14 2115 12/17/14 0025   12/16/14 2115  piperacillin-tazobactam (ZOSYN) IVPB 3.375 g     3.375 g 100 mL/hr over 30 Minutes Intravenous  Once 12/16/14 2109 12/16/14 2203   12/16/14 2115  vancomycin (VANCOCIN) IVPB 1000 mg/200 mL premix  Status:  Discontinued     1,000 mg 200 mL/hr over 60 Minutes Intravenous  Once 12/16/14 2109 12/16/14 2115        Subjective:   Lania Wendlandt was seen and examined today. Feels better, does not like full liquid diet anymore.  Diarrhea improving. Dizziness, weakness improving. No chest pain or shortness of breath, fevers or chills. No acute events overnight.    Objective:   Blood pressure 149/67, pulse 55, temperature 97.6 F (36.4 C), temperature source Oral, resp. rate 18, height 5\' 4"  (1.626 m), weight 74.3 kg (163 lb 12.8 oz), SpO2 95 %.  Wt Readings from Last 3 Encounters:  12/19/14 74.3 kg (163 lb 12.8 oz)  04/21/13 68.947 kg (152 lb)  04/04/13 69.7 kg (153 lb 10.6 oz)     Intake/Output Summary (Last 24 hours) at 12/20/14 1224 Last data filed at 12/20/14 [id number]  Gross per 24 hour  Intake 2768.25 ml  Output      0 ml  Net 2768.25 ml    Exam  General: Alert and oriented x 3, NAD  HEENT:  PERRLA, EOMI, Anicteric Sclera, mucous membranes moist.   Neck: Supple, no JVD, no masses  CVS: S1 S2clear, RRR  Respiratory: Diminished sounds  at the bases  Abdomen: Soft, NT, ND, NBS  Ext: no cyanosis clubbing, 1+ edema  Neuro: no new deficits  Skin: No rashes  Psych: Normal affect and demeanor, alert and oriented x3    Data Review   Micro Results Recent Results (from  the past 240 hour(s))  Blood Culture (routine x 2)     Status: None (Preliminary result)   Collection Time: 12/16/14  9:47 PM  Result Value Ref Range Status   Specimen Description BLOOD RIGHT ARM  Final   Special Requests BOTTLES DRAWN AEROBIC AND ANAEROBIC 5CC  Final   Culture NO GROWTH 4 DAYS  Final   Report Status PENDING  Incomplete  Blood Culture (routine x 2)     Status: None (Preliminary result)   Collection Time: 12/16/14  9:47 PM  Result Value Ref Range Status   Specimen Description BLOOD RIGHT HAND  Final   Special Requests IN PEDIATRIC BOTTLE 2.5CC  Final   Culture NO GROWTH 4 DAYS  Final   Report Status PENDING  Incomplete  Urine culture     Status: None   Collection Time: 12/16/14 11:46 PM  Result Value Ref Range Status   Specimen Description URINE, RANDOM  Final   Special Requests NONE  Final   Culture 3,000 COLONIES/mL INSIGNIFICANT GROWTH  Final   Report Status 12/18/2014 FINAL  Final  C difficile quick scan w PCR reflex     Status: Abnormal   Collection Time: 12/17/14  1:24 PM  Result Value Ref Range Status   C Diff antigen POSITIVE (A) NEGATIVE Final   C Diff toxin NEGATIVE NEGATIVE Final   C Diff interpretation   Final    C. difficile present, but toxin not detected. This indicates colonization. In most cases, this does not require treatment. If patient has signs and symptoms consistent with colitis, consider treatment. Requires ENTERIC precautions.  MRSA PCR Screening     Status: None   Collection Time: 12/17/14  2:28 PM  Result Value Ref Range Status   MRSA by PCR NEGATIVE NEGATIVE Final    Comment:        The GeneXpert MRSA Assay (FDA approved for NASAL specimens only), is one component of a comprehensive MRSA  colonization surveillance program. It is not intended to diagnose MRSA infection nor to guide or monitor treatment for MRSA infections.     Radiology Reports Ct Abdomen Pelvis Wo Contrast  12/17/2014  CLINICAL DATA:  Vomiting beginning at noon today. History of diverticulitis, irritable bowel, chronic kidney disease, hypertension. EXAM: CT ABDOMEN AND PELVIS WITHOUT CONTRAST TECHNIQUE: Multidetector CT imaging of the abdomen and pelvis was performed following the standard protocol without IV contrast. Oral contrast administered. COMPARISON:  CT abdomen pelvis August 18, 2013 FINDINGS: LUNG BASES: Similar to slightly larger small bilateral pleural effusions. Associated mild compressive atelectasis. Heart size is normal. Mild coronary artery calcifications. No pericardial effusions. KIDNEYS/BLADDER: Kidneys are orthotopic, demonstrating normal size and morphology. No nephrolithiasis, hydronephrosis; limited assessment for renal masses on this nonenhanced examination. The unopacified ureters are normal in course and caliber. Urinary bladder is partially distended and unremarkable. SOLID ORGANS: The liver is diffusely and mildly dense, which can be seen with amiodarone use, otherwise unremarkable. Spleen, pancreas and adrenal glands are unremarkable for this non-contrast examination. Status post cholecystectomy. GASTROINTESTINAL TRACT: Partially imaged moderate to large hiatal hernia. The stomach, small and large bowel are normal in course and caliber . Mild small bowel wall thickening. Moderate sigmoid diverticulosis. The appendix is not discretely identified, however there are no inflammatory changes in the right lower quadrant. PERITONEUM/RETROPERITONEUM: " Misty " mesentery with small reactive lymph nodes. Aortoiliac vessels are normal in course and caliber, moderate to severe calcific atherosclerosis. No lymphadenopathy by CT size criteria. Status post hysterectomy. No intraperitoneal free fluid  nor free  air. SOFT TISSUES/ OSSEOUS STRUCTURES: Nonsuspicious. Small fat containing umbilical hernia. Severe thoracolumbar levoscoliosis. Osteopenia. Moderate chronic T11 compression fracture. IMPRESSION: Mild small bowel wall thickening, which can be seen with enteritis. " Misty " mesentery and small lymph nodes are likely reactive. Partially imaged moderate to large hiatal hernia. Moderate sigmoid diverticulosis without acute diverticulitis. Similar to slightly larger pleural effusions. Electronically Signed   By: Elon Alas M.D.   On: 12/17/2014 01:39   Dg Chest Port 1 View  12/16/2014  CLINICAL DATA:  Dyspnea, cough and vomiting for 1 day EXAM: PORTABLE CHEST 1 VIEW COMPARISON:  04/02/2013 FINDINGS: There is moderate unchanged cardiomegaly. There is a large hiatal hernia. No confluent airspace consolidation. Minimal streaky atelectatic appearing opacities are present adjacent to the hiatal hernia. No large effusion. Pulmonary vasculature is normal. IMPRESSION: Cardiomegaly. Hiatal hernia. Mild atelectatic appearing basilar opacities on the left. Electronically Signed   By: Andreas Newport M.D.   On: 12/16/2014 23:35    CBC  Recent Labs Lab 12/16/14 2141 12/18/14 0458 12/19/14 0455 12/20/14 0522  WBC 7.3 10.3 7.9 5.8  HGB 13.3 10.5* 10.6* 10.4*  HCT 40.1 32.4* 33.2* 32.4*  PLT 144* 138* 129* 128*  MCV 86.2 89.0 89.7 89.5  MCH 28.6 28.8 28.6 28.7  MCHC 33.2 32.4 31.9 32.1  RDW 14.5 15.0 15.3 15.0  LYMPHSABS 0.8  --   --   --   MONOABS 0.6  --   --   --   EOSABS 0.0  --   --   --   BASOSABS 0.0  --   --   --     Chemistries   Recent Labs Lab 12/16/14 2141 12/17/14 0350 12/17/14 1027 12/18/14 0458 12/19/14 0455 12/20/14 0522  NA 142 142  --  142 143 141  K 3.3* 3.4*  --  3.6 4.3 4.9  CL 106 110  --  112* 114* 115*  CO2 22 24  --  23 22 22   GLUCOSE 128* 112*  --  100* 93 90  BUN 39* 37*  --  24* 16 13  CREATININE 1.60* 1.56*  --  1.23* 1.26* 1.11*  CALCIUM 8.8* 7.4*  --   7.9* 8.2* 8.4*  MG  --   --  1.9  --  1.8  --   AST 28  --   --  26  --   --   ALT 13*  --   --  15  --   --   ALKPHOS 59  --   --  47  --   --   BILITOT 0.8  --   --  0.5  --   --    ------------------------------------------------------------------------------------------------------------------ estimated creatinine clearance is 29.9 mL/min (by C-G formula based on Cr of 1.11). ------------------------------------------------------------------------------------------------------------------ No results for input(s): HGBA1C in the last 72 hours. ------------------------------------------------------------------------------------------------------------------ No results for input(s): CHOL, HDL, LDLCALC, TRIG, CHOLHDL, LDLDIRECT in the last 72 hours. ------------------------------------------------------------------------------------------------------------------  Recent Labs  12/19/14 0455  TSH 3.652   ------------------------------------------------------------------------------------------------------------------ No results for input(s): VITAMINB12, FOLATE, FERRITIN, TIBC, IRON, RETICCTPCT in the last 72 hours.  Coagulation profile No results for input(s): INR, PROTIME in the last 168 hours.  No results for input(s): DDIMER in the last 72 hours.  Cardiac Enzymes No results for input(s): CKMB, TROPONINI, MYOGLOBIN in the last 168 hours.  Invalid input(s): CK ------------------------------------------------------------------------------------------------------------------ Invalid input(s): POCBNP  No results for input(s): GLUCAP in the last 72 hours.   RAI,RIPUDEEP M.D. [facility] 12/20/2014,  12:24 PM  Pager: [phone] Between 7am to 7pm - call Pager - [phone]  After 7pm go to [email or web address] - password [email or web address]  Call night coverage person covering after 7pm

## 2014-12-20 NOTE — NC FL2 (Signed)
[place] LEVEL OF CARE SCREENING TOOL     IDENTIFICATION  Patient Name: Christina Cabrera Birthdate: Jun 17, 1917 Sex: female Admission Date (Current Location): 12/16/2014  [facility] and [place] Number: Company secretary and Address:  [facility], [place] [address], [place], [place] [address]      Provider Number: [id number]  Attending Physician Name and Address:  Mendel Corning, MD  Relative Name and Phone Number:       Current Level of Care: Hospital Recommended Level of Care: [place] Prior Approval Number:    Date Approved/Denied:   PASRR Number: [id number]  Discharge Plan: SNF    Current Diagnoses: Patient Active Problem List   Diagnosis Date Noted  . Enteritis 12/17/2014  . Sepsis ([place]) 12/17/2014  . Corticosteroid dependence ([place]) 12/17/2014  . Nausea & vomiting   . Premature atrial contractions   . Aortic stenosis   . Symptomatic bradycardia 04/02/2013  . Chronic kidney disease, stage III/IV   . Left wrist injury 06/10/2012  . Bilateral knee pain 06/10/2012  . Abdominal pain, acute 07/11/2011  . Polymyalgia rheumatica ([place]) 07/11/2011  . Lactic acidosis 07/11/2011  . Hypokalemia 07/11/2011  . Colitis, collagenous 07/11/2011  . CKD (chronic kidney disease), stage IV ([place]) 07/11/2011  . Hypothyroidism 07/11/2011  . HTN (hypertension) 07/11/2011    Orientation ACTIVITIES/SOCIAL BLADDER RESPIRATION    Self, Time, Situation, Place  Family supportive Continent Normal  BEHAVIORAL SYMPTOMS/MOOD NEUROLOGICAL BOWEL NUTRITION STATUS      Continent  (See DC Summary)  PHYSICIAN VISITS COMMUNICATION OF NEEDS Height & Weight Skin    Verbally 5\' 4"  (162.6 cm) 163 lbs. Normal          AMBULATORY STATUS RESPIRATION    Assist extensive Normal      Personal Care Assistance Level of Assistance  Bathing, Feeding, Dressing Bathing Assistance: Limited assistance Feeding assistance: Independent Dressing  Assistance: Limited assistance      Functional Limitations Info                SPECIAL CARE FACTORS FREQUENCY  PT (By licensed PT), OT (By licensed OT)     PT Frequency: 5x/week OT Frequency: 5x/week           Additional Factors Info  Code Status, Allergies, Isolation Precautions, Psychotropic Code Status Info: DNR Allergies Info: Codeine, Fosamax, Miacalcin, Nsaids, Quinolones, Sulfa Antibiotics Psychotropic Info: Xanax; Zoloft   Isolation Precautions Info: Enteric precautions (UV disinfection)     Current Medications (12/20/2014):  This is the current hospital active medication list Current Facility-Administered Medications  Medication Dose Route Frequency Provider Last Rate Last Dose  . ALPRAZolam Duanne Moron) tablet 0.25 mg  0.25 mg Oral QID Etta Quill, DO   0.25 mg at 12/20/14 1324  . furosemide (LASIX) tablet 40 mg  40 mg Oral q morning - 10a Ripudeep K Rai, MD   40 mg at 12/20/14 1020  . heparin injection 5,000 Units  5,000 Units Subcutaneous 3 times per day Etta Quill, DO   5,000 Units at 12/20/14 1324  . HYDROcodone-acetaminophen (NORCO) 10-325 MG per tablet 1 tablet  1 tablet Oral Q6H PRN Etta Quill, DO   1 tablet at 12/20/14 1324  . levothyroxine (SYNTHROID, LEVOTHROID) tablet 50 mcg  50 mcg Oral QAC breakfast Etta Quill, DO   50 mcg at 12/20/14 0756  . metoCLOPramide (REGLAN) injection 5 mg  5 mg Intravenous Once Orlie Dakin, MD   5 mg at  12/17/14 0001  . metroNIDAZOLE (FLAGYL) tablet 500 mg  500 mg Oral 3 times per day Ripudeep Krystal Eaton, MD   500 mg at 12/20/14 1324  . ondansetron (ZOFRAN) injection 4 mg  4 mg Intravenous Q6H PRN Etta Quill, DO      . pantoprazole (PROTONIX) EC tablet 40 mg  40 mg Oral Daily Etta Quill, DO   40 mg at 12/20/14 0756  . predniSONE (DELTASONE) tablet 6 mg  6 mg Oral Q breakfast Reyne Dumas, MD   6 mg at 12/20/14 0940  . saccharomyces boulardii (FLORASTOR) capsule 250 mg  250 mg Oral BID Ripudeep Krystal Eaton, MD    250 mg at 12/20/14 0940  . sertraline (ZOLOFT) tablet 25 mg  25 mg Oral QHS Etta Quill, DO   25 mg at 12/19/14 2121  . sodium chloride 0.9 % injection 3 mL  3 mL Intravenous Q12H Etta Quill, DO   3 mL at 12/18/14 2200     Discharge Medications: Please see discharge summary for a list of discharge medications.  Relevant Imaging Results:  Relevant Lab Results:  Recent Labs    Additional [place] Martavion Couper, LCSWA

## 2014-12-20 NOTE — Progress Notes (Signed)
Patient refusing heart healthy diet. Diet education explained to patient. Dr. Tana Coast made aware and verbal order to change to regular diet.

## 2014-12-20 NOTE — Progress Notes (Signed)
Physical Therapy Treatment Patient Details Name: Christina Cabrera MRN: [id number] DOB: 1917/03/16 Today's Date: 12/20/2014    History of Present Illness Christina Cabrera is a 81 y.o. female who presented to the ED with generalized body aches, abdominal pain and tenderness, nausea and vomiting.  Dx: gastroenteritis, myalgias, dehydration, elevated lactic acid. PMH includes arthritis, spinal stenosis, anxiety, osteoporosis.    PT Comments    Patient progressing well towards PT goals. Balance and gait improved with use of rollator as this is what pt is used to at home. Continues to fatigue due to lack of activity. Encouraged daily ambulation with RN to improve strength and mobility while in hospital. Will follow acutely per current POC.   Follow Up Recommendations  No PT follow up;Supervision for mobility/OOB     Equipment Recommendations  None recommended by PT    Recommendations for Other Services       Precautions / Restrictions Precautions Precautions: Fall Precaution Comments: pt's last fall was over a year ago, slipped on soap in the shower Restrictions Weight Bearing Restrictions: No    Mobility  Bed Mobility Overal bed mobility: Needs Assistance Bed Mobility: Sit to Supine       Sit to supine: Mod assist   General bed mobility comments: Assist to bring BLEs into bed to return to supine.  Transfers Overall transfer level: Needs assistance Equipment used: 4-wheeled walker Transfers: Sit to/from Stand Sit to Stand: Supervision         General transfer comment: Supervision for pt's safety.  Increased time to get to standing position secondary to arthritis in bil knees but no assist needed. Safe technique. Stood from chair x1, BSC x1.  Ambulation/Gait Ambulation/Gait assistance: Supervision Ambulation Distance (Feet): 275 Feet Assistive device: 4-wheeled walker Gait Pattern/deviations: Step-through pattern;Decreased stride length;Trunk flexed Gait velocity:  decreased   General Gait Details: Slow, steady gait with rollator. Mild SOB.    Stairs            Wheelchair Mobility    Modified Rankin (Stroke Patients Only)       Balance Overall balance assessment: Needs assistance Sitting-balance support: Feet supported;No upper extremity supported Sitting balance-Leahy Scale: Good     Standing balance support: During functional activity Standing balance-Leahy Scale: Fair Standing balance comment: Tolerated washing hands at sink without UE support for short period. Uses UE support for ambulation.                    Cognition Arousal/Alertness: Awake/alert Behavior During Therapy: WFL for tasks assessed/performed Overall Cognitive Status: Within Functional Limits for tasks assessed                      Exercises      General Comments General comments (skin integrity, edema, etc.): Daughter and pastor present. Stepped out of room for session.      Pertinent Vitals/Pain Pain Assessment: No/denies pain    Home Living                      Prior Function            PT Goals (current goals can now be found in the care plan section) Progress towards PT goals: Progressing toward goals    Frequency  Min 2X/week    PT Plan Current plan remains appropriate    Co-evaluation             End of Session Equipment Utilized During Treatment: Gait belt  Activity Tolerance: Patient tolerated treatment well;Patient limited by fatigue Patient left: in bed;with call bell/phone within reach;with family/visitor present     Time: [id number] PT Time Calculation (min) (ACUTE ONLY): 26 min  Charges:  $Gait Training: 8-22 mins $Therapeutic Activity: 8-22 mins                    G Codes:      Leavy Heatherly A Ronika Kelson 12/20/2014, 10:38 AM Wray Kearns, [place], DPT [phone]

## 2014-12-20 NOTE — Care Management Important Message (Signed)
Important Message  Patient Details  Name: Christina Cabrera MRN: [id number] Date of Birth: 1917/12/20   Medicare Important Message Given:  Yes    Carles Collet, RN 12/20/2014, 3:32 [place] Message  Patient Details  Name: Christina Cabrera MRN: [id number] Date of Birth: August 20, 1917   Medicare Important Message Given:  Yes    Carles Collet, RN 12/20/2014, 3:32 PM

## 2014-12-20 NOTE — Evaluation (Signed)
Occupational Therapy Evaluation Patient Details Name: Christina Cabrera MRN: [id number] DOB: 10/07/1917 Today's Date: 12/20/2014    History of Present Illness Christina Cabrera is a 81 y.o. female who presented to the ED with generalized body aches, abdominal pain and tenderness, nausea and vomiting.  Dx: gastroenteritis, myalgias, dehydration, elevated lactic acid. PMH includes arthritis, spinal stenosis, anxiety, osteoporosis.   Clinical Impression   Pt planning to go to SNF at her facility initially. Education provided in session. Feel pt will benefit from acute OT to increase independence and strength prior to d/c.     Follow Up Recommendations  SNF    Equipment Recommendations  Other (comment) (defer to next venue)    Recommendations for Other Services       Precautions / Restrictions Precautions Precautions: Fall Precaution Comments: pt's last fall was over a year ago, slipped on soap in the shower Restrictions Weight Bearing Restrictions: No      Mobility Bed Mobility Overal bed mobility: Modified Independent (sit to supine)                Transfers Overall transfer level: Needs assistance Equipment used: Rolling walker (2 wheeled) Transfers: Sit to/from Stand Sit to Stand: Min guard         General transfer comment: knees popping in session with sit to stand transfer. Increased time to stand and to go to sitting position on 3 in 1 over commode.     Balance Min guard given for ambulation with RW. Supervision at sink-no LOB.                        ADL Overall ADL's : Needs assistance/impaired Eating/Feeding: Independent;Sitting   Grooming: Wash/dry hands;Set up;Supervision/safety;Standing               Lower Body Dressing: Min guard;Sit to/from stand   Toilet Transfer: Min guard;Ambulation;RW (3 in 1 over commode)   Toileting- Clothing Manipulation and Hygiene: Min guard (sitting/standing)       Functional mobility during ADLs:  Min guard;Rolling walker General ADL Comments: Educated on energy conservation. Asked pt if she was interested in UE exercises and she did not sound interested. OT explained that using arms for transfers will work on arm strength.  Educated on tub transfer technique of backing to flat chair and swinging legs in.     Vision     Perception     Praxis      Pertinent Vitals/Pain Pain Assessment: 0-10 Pain Score: 10-Worst pain ever Pain Location: bilateral knees and back Pain Intervention(s): Monitored during session     Hand Dominance     Extremity/Trunk Assessment Upper Extremity Assessment Upper Extremity Assessment: RUE deficits/detail;LUE deficits/detail RUE Deficits / Details: bilateral shoulder flexors-weak; limited AROM with shoulder flexion LUE Deficits / Details: bilateral shoulder flexors-weak; limited AROM with shoulder flexion   Lower Extremity Assessment Lower Extremity Assessment: Defer to PT evaluation       Communication Communication Communication: No difficulties   Cognition Arousal/Alertness: Awake/alert Behavior During Therapy: WFL for tasks assessed/performed Overall Cognitive Status: Within Functional Limits for tasks assessed                     General Comments       Exercises       Shoulder Instructions      Home Living Family/patient expects to be discharged to:: Skilled nursing facility  Additional Comments: pt has a tub/shower and shower chair but sponge bathes      Prior Functioning/Environment Level of Independence: Independent with assistive device(s)        Comments: Has been taking sponge baths on her own, Independent with ADLs per pt.  Independent using rollator at baseline.    OT Diagnosis: Generalized weakness   OT Problem List: Pain;Decreased strength;Decreased activity tolerance;Decreased knowledge of use of DME or AE;Decreased knowledge of precautions   OT  Treatment/Interventions: Self-care/ADL training;DME and/or AE instruction;Therapeutic activities;Patient/family education;Balance training;Therapeutic exercise;Energy conservation    OT Goals(Current goals can be found in the care plan section) Acute Rehab OT Goals Patient Stated Goal: get back to doing crossword puzzles OT Goal Formulation: With patient Time For Goal Achievement: 12/27/14 Potential to Achieve Goals: Good ADL Goals Pt Will Perform Lower Body Bathing: with set-up;sit to/from stand Pt Will Perform Lower Body Dressing: with set-up;sit to/from stand Pt Will Transfer to Toilet: ambulating;with modified independence  OT Frequency: Min 2X/week   Barriers to D/C:            Co-evaluation              End of Session Equipment Utilized During Treatment: Gait belt;Rolling walker  Activity Tolerance: Patient tolerated treatment well Patient left: in bed;with call bell/phone within reach;with bed alarm set   Time: [id number] OT Time Calculation (min): 20 min Charges:  OT General Charges $OT Visit: 1 Procedure OT Evaluation $Initial OT Evaluation Tier I: 1 Procedure G-CodesBenito Mccreedy OTR/L [id number] 12/20/2014, 2:34 PM

## 2014-12-21 DIAGNOSIS — K529 Noninfective gastroenteritis and colitis, unspecified: Secondary | ICD-10-CM | POA: Diagnosis not present

## 2014-12-21 DIAGNOSIS — M25462 Effusion, left knee: Secondary | ICD-10-CM | POA: Diagnosis not present

## 2014-12-21 DIAGNOSIS — E039 Hypothyroidism, unspecified: Secondary | ICD-10-CM | POA: Diagnosis not present

## 2014-12-21 DIAGNOSIS — E860 Dehydration: Secondary | ICD-10-CM | POA: Diagnosis not present

## 2014-12-21 DIAGNOSIS — N183 Chronic kidney disease, stage 3 (moderate): Secondary | ICD-10-CM | POA: Diagnosis not present

## 2014-12-21 DIAGNOSIS — R2689 Other abnormalities of gait and mobility: Secondary | ICD-10-CM | POA: Diagnosis not present

## 2014-12-21 DIAGNOSIS — R609 Edema, unspecified: Secondary | ICD-10-CM | POA: Diagnosis not present

## 2014-12-21 DIAGNOSIS — K219 Gastro-esophageal reflux disease without esophagitis: Secondary | ICD-10-CM | POA: Diagnosis not present

## 2014-12-21 DIAGNOSIS — I10 Essential (primary) hypertension: Secondary | ICD-10-CM | POA: Diagnosis not present

## 2014-12-21 DIAGNOSIS — R197 Diarrhea, unspecified: Secondary | ICD-10-CM | POA: Diagnosis not present

## 2014-12-21 DIAGNOSIS — R29898 Other symptoms and signs involving the musculoskeletal system: Secondary | ICD-10-CM | POA: Diagnosis not present

## 2014-12-21 DIAGNOSIS — F1920 Other psychoactive substance dependence, uncomplicated: Secondary | ICD-10-CM | POA: Diagnosis not present

## 2014-12-21 DIAGNOSIS — R2681 Unsteadiness on feet: Secondary | ICD-10-CM | POA: Diagnosis not present

## 2014-12-21 DIAGNOSIS — S81802A Unspecified open wound, left lower leg, initial encounter: Secondary | ICD-10-CM | POA: Diagnosis not present

## 2014-12-21 DIAGNOSIS — M6281 Muscle weakness (generalized): Secondary | ICD-10-CM | POA: Diagnosis not present

## 2014-12-21 DIAGNOSIS — N182 Chronic kidney disease, stage 2 (mild): Secondary | ICD-10-CM | POA: Diagnosis not present

## 2014-12-21 DIAGNOSIS — F418 Other specified anxiety disorders: Secondary | ICD-10-CM | POA: Diagnosis not present

## 2014-12-21 DIAGNOSIS — R5381 Other malaise: Secondary | ICD-10-CM | POA: Diagnosis not present

## 2014-12-21 DIAGNOSIS — K589 Irritable bowel syndrome without diarrhea: Secondary | ICD-10-CM | POA: Diagnosis not present

## 2014-12-21 DIAGNOSIS — R935 Abnormal findings on diagnostic imaging of other abdominal regions, including retroperitoneum: Secondary | ICD-10-CM | POA: Diagnosis not present

## 2014-12-21 DIAGNOSIS — K52831 Collagenous colitis: Secondary | ICD-10-CM | POA: Diagnosis not present

## 2014-12-21 DIAGNOSIS — M353 Polymyalgia rheumatica: Secondary | ICD-10-CM | POA: Diagnosis not present

## 2014-12-21 DIAGNOSIS — A419 Sepsis, unspecified organism: Secondary | ICD-10-CM | POA: Diagnosis not present

## 2014-12-21 LAB — CULTURE, BLOOD (ROUTINE X 2)
CULTURE: NO GROWTH
Culture: NO GROWTH

## 2014-12-21 LAB — BASIC METABOLIC PANEL
ANION GAP: 10 (ref 5–15)
BUN: 13 mg/dL (ref 6–20)
CALCIUM: 8.7 mg/dL — AB (ref 8.9–10.3)
CO2: 22 mmol/L (ref 22–32)
CREATININE: 1.29 mg/dL — AB (ref 0.44–1.00)
Chloride: 107 mmol/L (ref 101–111)
GFR calc Af Amer: 39 mL/min — ABNORMAL LOW (ref >60)
GFR, EST NON AFRICAN AMERICAN: 34 mL/min — AB (ref >60)
GLUCOSE: 89 mg/dL (ref 65–99)
Potassium: 4 mmol/L (ref 3.5–5.1)
Sodium: 139 mmol/L (ref 135–145)

## 2014-12-21 LAB — CBC
HCT: 38.5 % (ref 36.0–46.0)
HEMOGLOBIN: 12 g/dL (ref 12.0–15.0)
MCH: 27.5 pg (ref 26.0–34.0)
MCHC: 31.2 g/dL (ref 30.0–36.0)
MCV: 88.3 fL (ref 78.0–100.0)
PLATELETS: 179 10*3/uL (ref 150–400)
RBC: 4.36 MIL/uL (ref 3.87–5.11)
RDW: 14.9 % (ref 11.5–15.5)
WBC: 7.4 10*3/uL (ref 4.0–10.5)

## 2014-12-21 LAB — FECAL LACTOFERRIN, QUANT: FECAL LACTOFERRIN: POSITIVE

## 2014-12-21 MED ORDER — AMLODIPINE BESYLATE 5 MG PO TABS
5.0000 mg | ORAL_TABLET | Freq: Every day | ORAL | Status: DC
  Administered 2014-12-21: 5 mg via ORAL
  Filled 2014-12-21: qty 1

## 2014-12-21 MED ORDER — HYDRALAZINE HCL 50 MG PO TABS
50.0000 mg | ORAL_TABLET | Freq: Three times a day (TID) | ORAL | Status: DC
  Administered 2014-12-21: 50 mg via ORAL
  Filled 2014-12-21: qty 1

## 2014-12-21 MED ORDER — METRONIDAZOLE 500 MG PO TABS: 500.0000 mg | ORAL_TABLET | Freq: Three times a day (TID) | ORAL | Status: DC

## 2014-12-21 MED ORDER — PROMETHAZINE HCL 12.5 MG PO TABS: 12.5000 mg | ORAL_TABLET | Freq: Four times a day (QID) | ORAL | Status: DC | PRN

## 2014-12-21 MED ORDER — HYDROCODONE-ACETAMINOPHEN 10-325 MG PO TABS: 1.0000 | ORAL_TABLET | Freq: Four times a day (QID) | ORAL | Status: DC | PRN

## 2014-12-21 MED ORDER — POLYETHYLENE GLYCOL 3350 17 G PO PACK: 17.0000 g | PACK | Freq: Every day | ORAL | Status: DC

## 2014-12-21 MED ORDER — SACCHAROMYCES BOULARDII 250 MG PO CAPS: 250.0000 mg | ORAL_CAPSULE | Freq: Two times a day (BID) | ORAL | Status: DC

## 2014-12-21 NOTE — Progress Notes (Addendum)
Mittie Bodo to be D/C'd Skilled nursing facility per MD order.  Discussed with the patient and all questions fully answered.  VSS, Skin clean, dry and intact without evidence of skin break down, no evidence of skin tears noted. IV catheter discontinued intact. Site without signs and symptoms of complications. Dressing and pressure applied.  An After Visit Summary was printed and given to the patient. Patient received prescription.  D/c education completed with patient/family including follow up instructions, medication list, d/c activities limitations if indicated, with other d/c instructions as indicated by MD - patient able to verbalize understanding, all questions fully answered.   Patient instructed to return to ED, call 911, or call MD for any changes in condition.   Patient escorted via [place], and D/C via private auto.  12:44 PM Attempted Report to [facility]   12:48 PM Report given to Joellen Jersey, Education officer, museum at [employer]. Katie stated RN at lunch  [place], [place] C 12/21/2014 12:44 PM

## 2014-12-21 NOTE — Discharge Summary (Signed)
Physician Discharge Summary   Patient ID: MAKAYLI LINKENHOKER MRN: [id number] DOB/AGE: 23-Sep-1917 81 y.o.  Admit date: 12/16/2014 Discharge date: 12/21/2014  Primary Care Physician:  Gennette Pac, MD  Discharge Diagnoses:    . sepsis with Enteritis . profound dehydration with lactic acidosis  . Chronic kidney disease, stage III/IV . history of polymyalgia rheumatica with Corticosteroid dependence (HCC)   C. difficile colonization    Acute on chronic kidney disease, stage 3/4    Generalized debility   Consults:  Gastroenterology  Recommendations for Outpatient Follow-up:  1. Please follow CBC, BMET    DIET: Regular diet as tolerated    Allergies:   Allergies  Allergen Reactions  . Codeine Nausea And Vomiting  . Fosamax [Alendronate Sodium]     Unknown.   Dion Saucier [Calcitonin]     Unknown.  . Nsaids     Due to renal function.   . Quinolones Other (See Comments)    platelets  . Sulfa Antibiotics Nausea And Vomiting     DISCHARGE MEDICATIONS: Current Discharge Medication List    START taking these medications   Details  metroNIDAZOLE (FLAGYL) 500 MG tablet Take 1 tablet (500 mg total) by mouth 3 (three) times daily. X9 more days Qty: 27 tablet, Refills: 0    promethazine (PHENERGAN) 12.5 MG tablet Take 1 tablet (12.5 mg total) by mouth every 6 (six) hours as needed for nausea or vomiting. Qty: 30 tablet, Refills: 0    saccharomyces boulardii (FLORASTOR) 250 MG capsule Take 1 capsule (250 mg total) by mouth 2 (two) times daily. May change to generic available at the pharmacy Qty: 60 capsule, Refills: 3      CONTINUE these medications which have CHANGED   Details  HYDROcodone-acetaminophen (NORCO) 10-325 MG tablet Take 1 tablet by mouth every 6 (six) hours as needed. pain Qty: 20 tablet, Refills: 0    polyethylene glycol (MIRALAX / GLYCOLAX) packet Take 17 g by mouth daily. For constipation. Change to as needed daily if diaarhea or more than 2 BM's  in a day Qty: 30 each, Refills: 1      CONTINUE these medications which have NOT CHANGED   Details  ALPRAZolam (XANAX) 0.5 MG tablet Take 0.25 mg by mouth 4 (four) times daily.     amLODipine (NORVASC) 5 MG tablet Take 5 mg by mouth daily.    Cholecalciferol (VITAMIN D) 2000 UNITS CAPS Take 2,000 Units by mouth daily.    furosemide (LASIX) 40 MG tablet Take 40 mg by mouth every morning.     hydrALAZINE (APRESOLINE) 50 MG tablet Take 1 tablet (50 mg total) by mouth 3 (three) times daily. (Every 8 hours) Qty: 90 tablet, Refills: 2    levothyroxine (SYNTHROID, LEVOTHROID) 50 MCG tablet Take 50 mcg by mouth daily.     Multiple Vitamins-Minerals (PRESERVISION AREDS PO) Take 1 tablet by mouth 2 (two) times daily.    omeprazole (PRILOSEC) 20 MG capsule Take 20 mg by mouth daily.    potassium chloride SA (K-DUR,KLOR-CON) 20 MEQ tablet Take 1 tablet (20 mEq total) by mouth 2 (two) times daily. Qty: 60 tablet, Refills: 2    !! predniSONE (DELTASONE) 1 MG tablet Take 1 mg by mouth daily with breakfast. Take with prednisone 5mg     !! predniSONE (DELTASONE) 5 MG tablet Take 5 mg by mouth every morning. Take with prednisone 1 mg    sertraline (ZOLOFT) 25 MG tablet Take 25 mg by mouth at bedtime.      !! -  Potential duplicate medications found. Please discuss with provider.       Brief H and P: For complete details please refer to admission H and P, but in brief SIHAAM STINER is a 81 y.o. female who presented to the ED with generalized body aches, abdominal pain and tenderness, nausea and vomiting. No treatment prior to coming to ED. Work up in ED is suggestive of enteritis on CT scan with fever of 101.9, hypotensive which improved with IVF. Patient was admitted to stepdown for further workup.  Hospital Course:  Sepsis [facility]): Source likely gastroenteritis, dehydration with elevated lactic acid Improved, patient tolerating solid diet without any difficulty. - CT of the abdomen and  pelvis showed enteritis, GI pathogen panel, stool cultures, fecal lactoferrin still pending - CDiff toxin negative but antigen positive, likely colonization, usually does not require any treatment. However patient has symptoms of enteritis, hence patient was continued on IV Flagyl. She was transitioned to oral Flagyl for 9 more days. Probiotic was added. - Urine culture showed no significant growth, discontinued IV Rocephin - Blood cultures remain negative.  Dehydration with elevated lactic acid - Resolved, noticing peripheral edema, restarted patient's home dose of Lasix   Acute on Chronic kidney disease, stage III/IV: Likely due to significant dehydration from diarrhea, dehydration - Resolved with IV fluid hydration  Hypertension - Continue amlodipine, hydralazine, Lasix  Generalized debility - PT evaluation was done and recommended skilled nursing facility  History of PMR polymyalgia rheumatica- Corticosteroid dependence (HCC) - Continue oral prednisone  Hypothyroidism Continue Synthroid, TSH 3.6  Day of Discharge BP 147/64 mmHg  Pulse 56  Temp(Src) 97.8 F (36.6 C) (Oral)  Resp 18  Ht 5\' 4"  (1.626 m)  Wt 74.3 kg (163 lb 12.8 oz)  BMI 28.10 kg/m2  SpO2 97%  Physical Exam: General: Alert and awake oriented x3 not in any acute distress. HEENT: anicteric sclera, pupils reactive to light and accommodation CVS: S1-S2 clear no murmur rubs or gallops Chest: clear to auscultation bilaterally, no wheezing rales or rhonchi Abdomen: soft nontender, nondistended, normal bowel sounds Extremities: no cyanosis, clubbing or edema noted bilaterally Neuro: Cranial nerves II-XII intact, no focal neurological deficits   The results of significant diagnostics from this hospitalization (including imaging, microbiology, ancillary and laboratory) are listed below for reference.    LAB RESULTS: Basic Metabolic Panel:  Recent Labs Lab 12/19/14 0455 12/20/14 0522 12/21/14 0558  NA 143  141 139  K 4.3 4.9 4.0  CL 114* 115* 107  CO2 22 22 22   GLUCOSE 93 90 89  BUN 16 13 13   CREATININE 1.26* 1.11* 1.29*  CALCIUM 8.2* 8.4* 8.7*  MG 1.8  --   --    Liver Function Tests:  Recent Labs Lab 12/16/14 2141 12/18/14 0458  AST 28 26  ALT 13* 15  ALKPHOS 59 47  BILITOT 0.8 0.5  PROT 6.0* 5.0*  ALBUMIN 3.6 2.6*    Recent Labs Lab 12/16/14 2141  LIPASE 34   No results for input(s): AMMONIA in the last 168 hours. CBC:  Recent Labs Lab 12/16/14 2141  12/20/14 0522 12/21/14 0558  WBC 7.3  < > 5.8 7.4  NEUTROABS 6.0  --   --   --   HGB 13.3  < > 10.4* 12.0  HCT 40.1  < > 32.4* 38.5  MCV 86.2  < > 89.5 88.3  PLT 144*  < > 128* 179  < > = values in this interval not displayed. Cardiac Enzymes: No results for  input(s): CKTOTAL, CKMB, CKMBINDEX, TROPONINI in the last 168 hours. BNP: Invalid input(s): POCBNP CBG: No results for input(s): GLUCAP in the last 168 hours.  Significant Diagnostic Studies:  Ct Abdomen Pelvis Wo Contrast  12/17/2014  CLINICAL DATA:  Vomiting beginning at noon today. History of diverticulitis, irritable bowel, chronic kidney disease, hypertension. EXAM: CT ABDOMEN AND PELVIS WITHOUT CONTRAST TECHNIQUE: Multidetector CT imaging of the abdomen and pelvis was performed following the standard protocol without IV contrast. Oral contrast administered. COMPARISON:  CT abdomen pelvis August 18, 2013 FINDINGS: LUNG BASES: Similar to slightly larger small bilateral pleural effusions. Associated mild compressive atelectasis. Heart size is normal. Mild coronary artery calcifications. No pericardial effusions. KIDNEYS/BLADDER: Kidneys are orthotopic, demonstrating normal size and morphology. No nephrolithiasis, hydronephrosis; limited assessment for renal masses on this nonenhanced examination. The unopacified ureters are normal in course and caliber. Urinary bladder is partially distended and unremarkable. SOLID ORGANS: The liver is diffusely and mildly  dense, which can be seen with amiodarone use, otherwise unremarkable. Spleen, pancreas and adrenal glands are unremarkable for this non-contrast examination. Status post cholecystectomy. GASTROINTESTINAL TRACT: Partially imaged moderate to large hiatal hernia. The stomach, small and large bowel are normal in course and caliber . Mild small bowel wall thickening. Moderate sigmoid diverticulosis. The appendix is not discretely identified, however there are no inflammatory changes in the right lower quadrant. PERITONEUM/RETROPERITONEUM: " Misty " mesentery with small reactive lymph nodes. Aortoiliac vessels are normal in course and caliber, moderate to severe calcific atherosclerosis. No lymphadenopathy by CT size criteria. Status post hysterectomy. No intraperitoneal free fluid nor free air. SOFT TISSUES/ OSSEOUS STRUCTURES: Nonsuspicious. Small fat containing umbilical hernia. Severe thoracolumbar levoscoliosis. Osteopenia. Moderate chronic T11 compression fracture. IMPRESSION: Mild small bowel wall thickening, which can be seen with enteritis. " Misty " mesentery and small lymph nodes are likely reactive. Partially imaged moderate to large hiatal hernia. Moderate sigmoid diverticulosis without acute diverticulitis. Similar to slightly larger pleural effusions. Electronically Signed   By: Elon Alas M.D.   On: 12/17/2014 01:39   Dg Chest Port 1 View  12/16/2014  CLINICAL DATA:  Dyspnea, cough and vomiting for 1 day EXAM: PORTABLE CHEST 1 VIEW COMPARISON:  04/02/2013 FINDINGS: There is moderate unchanged cardiomegaly. There is a large hiatal hernia. No confluent airspace consolidation. Minimal streaky atelectatic appearing opacities are present adjacent to the hiatal hernia. No large effusion. Pulmonary vasculature is normal. IMPRESSION: Cardiomegaly. Hiatal hernia. Mild atelectatic appearing basilar opacities on the left. Electronically Signed   By: Andreas Newport M.D.   On: 12/16/2014 23:35    2D  ECHO:   Disposition and Follow-up: Discharge Instructions    Diet general    Complete by:  As directed      Discharge instructions    Complete by:  As directed   Please take miralax daily, you may change it to as needed if diarrhea or more than 2 BM's in a day  Please take antibiotic, Flagyl with food.     Increase activity slowly    Complete by:  As directed             DISPOSITION: Skilled nursing facility   DISCHARGE FOLLOW-UP Follow-up Information    Follow up with [facility].   Specialties:  [place], [place]   Contact information:   [place] [phone]      Follow up with Gennette Pac, MD. Schedule an appointment as soon as possible for a  visit in 10 days.   Specialty:  Family Medicine   Why:  for hospital follow-up   Contact information:   [place] [address] [phone]       Follow up with EDWARDS JR,JAMES L, MD. Schedule an appointment as soon as possible for a visit in 2 weeks.   Specialty:  Gastroenterology   Why:  for hospital follow-up   Contact information:   [address] [place] [address] [phone]        Time spent on Discharge: 35 mins   Signed:   Tekila Caillouet M.D. [facility] 12/21/2014, 11:41 AM Pager: [phone]

## 2014-12-21 NOTE — Care Management Note (Signed)
Case Management Note  Patient Details  Name: Christina Cabrera MRN: [id number] Date of Birth: 1917-08-17  Subjective/Objective:                  Patient is from [place], admitted with sepsis. Anticipate DC to SNF [facility] today as facilitated through [place].  Action/Plan:   Expected Discharge Date:                  Expected Discharge Plan:  Skilled Nursing Facility  In-House Referral:  Clinical Social Work  Discharge planning Services  CM Consult  Post Acute Care Choice:    Choice offered to:     DME Arranged:    DME Agency:     HH Arranged:    [place] Agency:     Status of Service:  Completed, signed off  Medicare Important Message Given:  Yes Date Medicare IM Given:    Medicare IM give by:    Date Additional Medicare IM Given:    Additional Medicare Important Message give by:     If discussed at [address] of Stay Meetings, dates discussed:    Additional Comments:  Carles Collet, RN 12/21/2014, 10:52 AM

## 2014-12-21 NOTE — Progress Notes (Signed)
Patient will DC to: [facility]  Anticipated DC date: 12/21/14 Family notified: Dtr, [place] Transport by: In car w/ dtr  CSW signing off.  Cedric Fishman, [place] Social Worker [phone]

## 2014-12-21 NOTE — Progress Notes (Signed)
[facility] PROGRESS NOTE Subjective patient reports that she only had one school yesterday with a small amount of solid material. She is eating solid food feels slightly nauseous  Objective: Vital signs in last 24 hours: Temp:  [97.8 F (36.6 C)-98 F (36.7 C)] 97.8 F (36.6 C) (12/07 0532) Pulse Rate:  [54-65] 65 (12/07 0532) Resp:  [18-20] 18 (12/07 0532) BP: (139-160)/(61-79) 159/79 mmHg (12/07 0532) SpO2:  [94 %-97 %] 97 % (12/07 0532) Last BM Date: 12/20/14  Intake/Output from previous day: 12/06 0701 - 12/07 0700 In: 1182.5 [P.O.:682; I.V.:500.5] Out: 1500 [Urine:1500] Intake/Output this shift:    PE: General-- sitting up eating breakfast no acute distress  Abdomen-- soft nontender  Lab Results:  Recent Labs  12/19/14 0455 12/20/14 0522 12/21/14 0558  WBC 7.9 5.8 7.4  HGB 10.6* 10.4* 12.0  HCT 33.2* 32.4* 38.5  PLT 129* 128* 179   BMET  Recent Labs  12/19/14 0455 12/20/14 0522 12/21/14 0558  NA 143 141 139  K 4.3 4.9 4.0  CL 114* 115* 107  CO2 22 22 22   CREATININE 1.26* 1.11* 1.29*   LFT No results for input(s): PROT, AST, ALT, ALKPHOS, BILITOT, BILIDIR, IBILI in the last 72 hours. PT/INR No results for input(s): LABPROT, INR in the last 72 hours. PANCREAS No results for input(s): LIPASE in the last 72 hours.       Studies/Results: No results found.  Medications: I have reviewed the patient's current medications.  Assessment/Plan: 1. Acute diarrhea. This appears to be improving. She is tolerating solid food can only have one bowel movement yesterday. Her G.I. pathogen panel and culture are still pending but I think she could be discharged and finish a few more days of Flagyl will see her back in the office and 4 to 6 weeks or as needed.   Bradly Sangiovanni JR,Angelin Cutrone L 12/21/2014, 9:29 AM  Pager: [phone] If no answer or after hours call [phone]

## 2014-12-21 NOTE — Clinical Social Work Placement (Signed)
   CLINICAL SOCIAL WORK PLACEMENT  NOTE  Date:  12/21/2014  Patient Details  Name: Christina Cabrera MRN: [id number] Date of Birth: 1917/10/01  Clinical Social Work is seeking post-discharge placement for this patient at the [place] level of care (*CSW will initial, date and re-position this form in  chart as items are completed):  Yes (Pt moving from [facility] to [facility])   Patient/family provided with [place] Work Department's list of facilities offering this level of care within the geographic area requested by the patient (or if unable, by the patient's family).  Yes   Patient/family informed of their freedom to choose among providers that offer the needed level of care, that participate in Medicare, Medicaid or managed care program needed by the patient, have an available bed and are willing to accept the patient.  Yes   Patient/family informed of [facility]'s ownership interest in [facility] and [facility], as well as of the fact that they are under no obligation to receive care at these facilities.  PASRR submitted to EDS on 12/20/14     PASRR number received on 12/20/14     Existing PASRR number confirmed on       FL2 transmitted to all facilities in geographic area requested by pt/family on       FL2 transmitted to all facilities within larger geographic area on       Patient informed that his/her managed care company has contracts with or will negotiate with certain facilities, including the following:        Yes   Patient/family informed of bed offers received.  Patient chooses bed at [facility]     Physician recommends and patient chooses bed at      Patient to be transferred to [facility] on 12/21/14.  Patient to be transferred to facility by Daughter in car     Patient family notified on 12/21/14 of transfer.  Name of family member notified:  Lorelle Gibbs      PHYSICIAN Please prepare prescriptions     Additional Comment:    _______________________________________________ Benard Halsted, [place] 12/21/2014, 12:22 PM

## 2014-12-22 LAB — GI PATHOGEN PANEL BY PCR, STOOL
C difficile toxin A/B: NOT DETECTED
Campylobacter by PCR: NOT DETECTED
Cryptosporidium by PCR: NOT DETECTED
E COLI (ETEC) LT/ST: NOT DETECTED
E COLI (STEC): NOT DETECTED
E coli 0157 by PCR: NOT DETECTED
G lamblia by PCR: NOT DETECTED
NOROVIRUS G1/G2: NOT DETECTED
ROTAVIRUS A BY PCR: NOT DETECTED
SALMONELLA BY PCR: NOT DETECTED
Shigella by PCR: NOT DETECTED

## 2014-12-22 LAB — OVA + PARASITE EXAM

## 2014-12-22 LAB — O&P RESULT

## 2014-12-23 ENCOUNTER — Encounter: Payer: Self-pay | Admitting: Internal Medicine

## 2014-12-23 ENCOUNTER — Non-Acute Institutional Stay (SKILLED_NURSING_FACILITY): Payer: Medicare Other | Admitting: Internal Medicine

## 2014-12-23 DIAGNOSIS — S81812A Laceration without foreign body, left lower leg, initial encounter: Secondary | ICD-10-CM | POA: Insufficient documentation

## 2014-12-23 DIAGNOSIS — M4120 Other idiopathic scoliosis, site unspecified: Secondary | ICD-10-CM

## 2014-12-23 DIAGNOSIS — M48061 Spinal stenosis, lumbar region without neurogenic claudication: Secondary | ICD-10-CM

## 2014-12-23 DIAGNOSIS — R531 Weakness: Secondary | ICD-10-CM | POA: Insufficient documentation

## 2014-12-23 DIAGNOSIS — R5381 Other malaise: Secondary | ICD-10-CM

## 2014-12-23 DIAGNOSIS — R609 Edema, unspecified: Secondary | ICD-10-CM

## 2014-12-23 DIAGNOSIS — K5730 Diverticulosis of large intestine without perforation or abscess without bleeding: Secondary | ICD-10-CM

## 2014-12-23 DIAGNOSIS — M353 Polymyalgia rheumatica: Secondary | ICD-10-CM

## 2014-12-23 DIAGNOSIS — R269 Unspecified abnormalities of gait and mobility: Secondary | ICD-10-CM | POA: Insufficient documentation

## 2014-12-23 DIAGNOSIS — R1310 Dysphagia, unspecified: Secondary | ICD-10-CM | POA: Insufficient documentation

## 2014-12-23 DIAGNOSIS — A419 Sepsis, unspecified organism: Secondary | ICD-10-CM

## 2014-12-23 DIAGNOSIS — K449 Diaphragmatic hernia without obstruction or gangrene: Secondary | ICD-10-CM

## 2014-12-23 DIAGNOSIS — R2681 Unsteadiness on feet: Secondary | ICD-10-CM

## 2014-12-23 DIAGNOSIS — M25462 Effusion, left knee: Secondary | ICD-10-CM

## 2014-12-23 DIAGNOSIS — S22089A Unspecified fracture of T11-T12 vertebra, initial encounter for closed fracture: Secondary | ICD-10-CM | POA: Insufficient documentation

## 2014-12-23 DIAGNOSIS — K52831 Collagenous colitis: Secondary | ICD-10-CM

## 2014-12-23 DIAGNOSIS — I10 Essential (primary) hypertension: Secondary | ICD-10-CM

## 2014-12-23 DIAGNOSIS — M1712 Unilateral primary osteoarthritis, left knee: Secondary | ICD-10-CM

## 2014-12-23 DIAGNOSIS — N182 Chronic kidney disease, stage 2 (mild): Secondary | ICD-10-CM

## 2014-12-23 DIAGNOSIS — F1920 Other psychoactive substance dependence, uncomplicated: Secondary | ICD-10-CM

## 2014-12-23 DIAGNOSIS — S81819A Laceration without foreign body, unspecified lower leg, initial encounter: Secondary | ICD-10-CM

## 2014-12-23 DIAGNOSIS — M5135 Other intervertebral disc degeneration, thoracolumbar region: Secondary | ICD-10-CM

## 2014-12-23 DIAGNOSIS — S81802A Unspecified open wound, left lower leg, initial encounter: Secondary | ICD-10-CM

## 2014-12-23 DIAGNOSIS — M5126 Other intervertebral disc displacement, lumbar region: Secondary | ICD-10-CM | POA: Insufficient documentation

## 2014-12-23 DIAGNOSIS — K589 Irritable bowel syndrome without diarrhea: Secondary | ICD-10-CM

## 2014-12-23 DIAGNOSIS — I517 Cardiomegaly: Secondary | ICD-10-CM

## 2014-12-23 HISTORY — DX: Laceration without foreign body, unspecified lower leg, initial encounter: S81.819A

## 2014-12-23 HISTORY — DX: Other intervertebral disc displacement, lumbar region: M51.26

## 2014-12-23 HISTORY — DX: Cardiomegaly: I51.7

## 2014-12-23 HISTORY — DX: Unsteadiness on feet: R26.81

## 2014-12-23 HISTORY — DX: Spinal stenosis, lumbar region without neurogenic claudication: M48.061

## 2014-12-23 HISTORY — DX: Diaphragmatic hernia without obstruction or gangrene: K44.9

## 2014-12-23 HISTORY — DX: Other intervertebral disc degeneration, thoracolumbar region: M51.35

## 2014-12-23 HISTORY — DX: Irritable bowel syndrome, unspecified: K58.9

## 2014-12-23 HISTORY — DX: Edema, unspecified: R60.9

## 2014-12-23 HISTORY — DX: Other malaise: R53.81

## 2014-12-23 HISTORY — DX: Effusion, left knee: M25.462

## 2014-12-23 HISTORY — DX: Other idiopathic scoliosis, site unspecified: M41.20

## 2014-12-23 HISTORY — DX: Unspecified fracture of t11-T12 vertebra, initial encounter for closed fracture: S22.089A

## 2014-12-23 HISTORY — DX: Diverticulosis of large intestine without perforation or abscess without bleeding: K57.30

## 2014-12-23 HISTORY — DX: Unilateral primary osteoarthritis, left knee: M17.12

## 2014-12-23 HISTORY — DX: Dysphagia, unspecified: R13.10

## 2014-12-23 NOTE — Progress Notes (Signed)
Patient ID: Christina Cabrera, female   DOB: 03-Jun-1917, 81 y.o.   MRN: [id number]    HISTORY AND PHYSICAL  Location:  [facility] Room Number: 2A Place of Service: SNF (31)   Extended Emergency Contact Information Primary Emergency Contact: Kirkman,Janice Address: [address]          [place], [place] [address] [name] of [place] Phone: [phone] Mobile Phone: [phone] Relation: Daughter  Advanced Directive information Does patient have an advance directive?: Yes, Type of Advance Directive: Living will;Out of facility DNR (pink MOST or yellow form), Pre-existing out of facility DNR order (yellow form or pink MOST form): Yellow form placed in chart (order not valid for inpatient use), Does patient want to make changes to advanced directive?: No - Patient declined  Chief Complaint  Patient presents with  . New Admit To SNF    Following hospitalization    HPI:  Admitted to skilled nursing facility 12/21/2014 following a hospitalization from 12-1618 12/21/2014 per sepsis with enteritis. This weekend and is debilitated since her hospitalization. She continues to have occasional loose stools. She was dehydrated on admission to hospital but this was corrected during her stay.  Patient has had polymyalgia rheumatica for greater than 10 years and has an associated steroid dependency now. She was covered with additional steroids at the beginning of her hospitalization. She has returned to 5 mg prednisone daily.  Stool culture showed probable C. difficile colonization.  Patient has osteoarthritis of the left knee and an effusion.  There is a significant degree of edema which she says is relatively new. Her daughter was assisting her in getting to [facility] and her fingernail Lipitor a small skin tear. With the edema, she has been weeping large amounts of fluid from the small wound.  There are number of other chronic conditions chart  detailed below. She has had previous back surgery for spinal stenosis and degenerative disc problems. She had a herniated nucleus pulposus at L4-5. She has a remote history of a T11 compression fracture. There is no scoliosis.  Even prior to the recent hospitalization she had done IBS with alternating constipation and diarrhea. She has known diverticulosis.  She has an unstable gait and generally uses a walker.  She previously has lived independently in an apartment at [facility]. She is in a skilled nursing facility now for stabilization and rehabilitation. Our hope is that she will be able to return to independent living.  Past Medical History  Diagnosis Date  . Hypertension   . Arthritis   . Diverticulitis   . Irritable bowel   . PMR (polymyalgia rheumatica) (HCC)     a. On chronic prednisone.  Marland Kitchen Spinal stenosis   . Hypothyroidism   . Chronic kidney disease     Stage III/IV  . Hyperlipidemia   . Cataract   . Anxiety   . Osteoporosis   . Symptomatic bradycardia     a. Adm 03/2013 - taken off clonidine and BB.  Marland Kitchen Premature atrial contractions     a. Noted during adm 03/2013, corresponding with patient's palpitations.  . Aortic stenosis     a. Mild AS by echo 03/2013.  Marland Kitchen Hypokalemia   . Colitis, collagenous 07/11/2011  . Corticosteroid dependence ([place]) 12/17/2014  . Polymyalgia rheumatica ([place]) 07/11/2011  . Cardiomegaly 12/23/2014  . Bilateral knee pain 06/10/2012  . DDD (degenerative disc disease), thoracolumbar 12/23/2014  . Debility 12/23/2014  . Diverticulosis of colon without hemorrhage  12/23/2014  . Dysphagia 12/23/2014  . Edema 12/23/2014  . Effusion of left knee 12/23/2014  . Enteritis 12/17/2014  . Hiatal hernia 12/23/2014  . HNP (herniated nucleus pulposus), lumbar 12/23/2014    L 4-5   . IBS (irritable bowel syndrome) 12/23/2014  . Noninfected skin tear of leg 12/23/2014    Left leg   . Osteoarthritis of left knee 12/23/2014  . Scoliosis (and kyphoscoliosis),  idiopathic 12/23/2014  . Spinal stenosis of lumbar region 12/23/2014  . T11 vertebral fracture ([facility]) 12/23/2014  . Unstable gait 12/23/2014    Past Surgical History  Procedure Laterality Date  . Back surgery    . Appendectomy    . Cholecystectomy    . Eye surgery    . Abdominal hysterectomy  1966  . Breast biopsies      Patient Care Team: Hulan Fess, MD as PCP - General (Family Medicine) Laurence Spates, MD as Consulting Physician (Gastroenterology) Hennie Duos, MD as Consulting Physician (Rheumatology) Corliss Parish, MD as Consulting Physician (Nephrology) Jettie Booze, MD as Consulting Physician (Cardiology) Glenna Fellows, MD as Attending Physician (Neurosurgery) Susa Day, MD as Consulting Physician (Orthopedic Surgery)  Social History   Social History  . Marital Status: Widowed    Spouse Name: N/A  . Number of Children: N/A  . Years of Education: N/A   Occupational History  . Retired Therapist, sports    Social History Main Topics  . Smoking status: Never Smoker   . Smokeless tobacco: Never Used  . Alcohol Use: No  . Drug Use: No  . Sexual Activity: No   Other Topics Concern  . Not on file   Social History Narrative   She is in [facility].    reports that she has never smoked. She has never used smokeless tobacco. She reports that she does not drink alcohol or use illicit drugs.  Family History  Problem Relation Age of Onset  . Hyperlipidemia Mother   . Hypertension Mother   . Hypertension Father   . Hyperlipidemia Father   . Hyperlipidemia Sister   . Hypertension Sister   . Diabetes Neg Hx   . Heart attack Neg Hx   . Sudden death Neg Hx    Family Status  Relation Status Death Age  . Mother Deceased   . Father Deceased      There is no immunization history on file for this patient.  Allergies  Allergen Reactions  . Codeine Nausea And Vomiting  . Fosamax [Alendronate Sodium]     Unknown.   Christina Saucier [Calcitonin]      Unknown.  . Nsaids     Due to renal function.   . Quinolones Other (See Comments)    platelets  . Sulfa Antibiotics Nausea And Vomiting    Medications: Patient's Medications  New Prescriptions   No medications on file  Previous Medications   ALPRAZOLAM (XANAX) 0.5 MG TABLET    Take 0.25 mg by mouth 4 (four) times daily.    AMLODIPINE (NORVASC) 5 MG TABLET    Take 5 mg by mouth daily.   CHOLECALCIFEROL (VITAMIN D) 2000 UNITS CAPS    Take 2,000 Units by mouth daily.   FUROSEMIDE (LASIX) 40 MG TABLET    Take 40 mg by mouth every morning.    HYDRALAZINE (APRESOLINE) 50 MG TABLET    Take 1 tablet (50 mg total) by mouth 3 (three) times daily. (Every 8 hours)   HYDROCODONE-ACETAMINOPHEN (NORCO) 10-325 MG TABLET  Take 1 tablet by mouth every 6 (six) hours as needed. pain   LEVOTHYROXINE (SYNTHROID, LEVOTHROID) 50 MCG TABLET    Take 50 mcg by mouth daily.    METRONIDAZOLE (FLAGYL) 500 MG TABLET    Take 1 tablet (500 mg total) by mouth 3 (three) times daily. X9 more days   MULTIPLE VITAMINS-MINERALS (PRESERVISION AREDS PO)    Take 1 tablet by mouth 2 (two) times daily.   OMEPRAZOLE (PRILOSEC) 20 MG CAPSULE    Take 20 mg by mouth daily.   POLYETHYLENE GLYCOL (MIRALAX / GLYCOLAX) PACKET    Take 17 g by mouth daily. For constipation. Change to as needed daily if diaarhea or more than 2 BM's in a day   POTASSIUM CHLORIDE SA (K-DUR,KLOR-CON) 20 MEQ TABLET    Take 1 tablet (20 mEq total) by mouth 2 (two) times daily.   PREDNISONE (DELTASONE) 1 MG TABLET    Take 1 mg by mouth daily with breakfast. Take with prednisone $RemoveBeforeD'5mg'[id number]$    PREDNISONE (DELTASONE) 5 MG TABLET    Take 5 mg by mouth every morning. Take with prednisone 1 mg   PROMETHAZINE (PHENERGAN) 12.5 MG TABLET    Take 1 tablet (12.5 mg total) by mouth every 6 (six) hours as needed for nausea or vomiting.   SACCHAROMYCES BOULARDII (FLORASTOR) 250 MG CAPSULE    Take 1 capsule (250 mg total) by mouth 2 (two) times daily. May change to generic available at  the pharmacy   SERTRALINE (ZOLOFT) 25 MG TABLET    Take 25 mg by mouth at bedtime.   Modified Medications   No medications on file  Discontinued Medications   No medications on file    Review of Systems  Constitutional: Negative for fever, chills, diaphoresis, activity change, appetite change, fatigue and unexpected weight change.  HENT: Negative for congestion, ear discharge, ear pain, hearing loss, postnasal drip, rhinorrhea, sore throat, tinnitus, trouble swallowing and voice change.   Eyes: Positive for visual disturbance (corrective lenses). Negative for pain, redness and itching.  Respiratory: Negative for cough, choking, shortness of breath and wheezing.   Cardiovascular: Positive for leg swelling. Negative for chest pain and palpitations.  Gastrointestinal: Negative for nausea, abdominal pain, diarrhea, constipation and abdominal distention.  Endocrine: Negative for cold intolerance, heat intolerance, polydipsia, polyphagia and polyuria.  Genitourinary: Negative for dysuria, urgency, frequency, hematuria, flank pain, vaginal discharge, difficulty urinating and pelvic pain.  Musculoskeletal: Negative for myalgias, back pain, arthralgias, gait problem, neck pain and neck stiffness.  Skin: Negative for color change, pallor and rash.       Recent skin tear  Allergic/Immunologic: Negative.   Neurological: Positive for weakness. Negative for dizziness, tremors, seizures, syncope, numbness and headaches.  Hematological: Negative for adenopathy. Does not bruise/bleed easily.  Psychiatric/Behavioral: Negative for suicidal ideas, hallucinations, behavioral problems, confusion, sleep disturbance, dysphoric mood and agitation. The patient is not nervous/anxious and is not hyperactive.     Filed Vitals:   12/23/14 1501  BP: 132/74  Pulse: 69  Temp: 97.9 F (36.6 C)  Resp: 20  Height: $Remove'5\' 4"'PlInydY$  (1.626 m)  Weight: 163 lb 11.2 oz (74.254 kg)   Body mass index is 28.09 kg/(m^2).  Physical  Exam  Constitutional: She is oriented to person, place, and time. She appears well-developed and well-nourished. No distress.  Overweight  HENT:  Right Ear: External ear normal.  Left Ear: External ear normal.  Nose: Nose normal.  Mouth/Throat: Oropharynx is clear and moist. No oropharyngeal exudate.  Eyes: Conjunctivae and EOM  are normal. Pupils are equal, round, and reactive to light. No scleral icterus.  Neck: No JVD present. No tracheal deviation present. No thyromegaly present.  Cardiovascular: Normal rate, regular rhythm, normal heart sounds and intact distal pulses.  Exam reveals no gallop and no friction rub.   No murmur heard. Pulmonary/Chest: Effort normal. No respiratory distress. She has no wheezes. She has no rales. She exhibits no tenderness.  Abdominal: She exhibits no distension and no mass. There is no tenderness.  Musculoskeletal: Normal range of motion. She exhibits no edema or tenderness.  Generalized weakness. Left knee effusion.  Lymphadenopathy:    She has no cervical adenopathy.  Neurological: She is alert and oriented to person, place, and time. No cranial nerve deficit. Coordination normal.  Skin: No rash noted. She is not diaphoretic. No erythema. No pallor.  Small triangular shaped skin tear of the left lower leg anteriorly that is weeping copious amounts of serous fluid. No infection.  Psychiatric: She has a normal mood and affect. Her behavior is normal. Judgment and thought content normal.    Labs reviewed: Lab Summary Latest Ref Rng 12/21/2014 12/20/2014 12/19/2014 12/18/2014  Hemoglobin 12.0 - 15.0 g/dL 12.0 10.4(L) 10.6(L) 10.5(L)  Hematocrit 36.0 - 46.0 % 38.5 32.4(L) 33.2(L) 32.4(L)  White count 4.0 - 10.5 K/uL 7.4 5.8 7.9 10.3  Platelet count 150 - 400 K/uL 179 128(L) 129(L) 138(L)  Sodium 135 - 145 mmol/L 139 141 143 142  Potassium 3.5 - 5.1 mmol/L 4.0 4.9 4.3 3.6  Calcium 8.9 - 10.3 mg/dL 8.7(L) 8.4(L) 8.2(L) 7.9(L)  Phosphorus - (None) (None)  (None) (None)  Creatinine 0.44 - 1.00 mg/dL 1.29(H) 1.11(H) 1.26(H) 1.23(H)  AST 15 - 41 U/L (None) (None) (None) 26  Alk Phos 38 - 126 U/L (None) (None) (None) 47  Bilirubin 0.3 - 1.2 mg/dL (None) (None) (None) 0.5  Glucose 65 - 99 mg/dL 89 90 93 100(H)  Cholesterol - (None) (None) (None) (None)  HDL cholesterol - (None) (None) (None) (None)  Triglycerides - (None) (None) (None) (None)  LDL Direct - (None) (None) (None) (None)  LDL Calc - (None) (None) (None) (None)  Total protein 6.5 - 8.1 g/dL (None) (None) (None) 5.0(L)  Albumin 3.5 - 5.0 g/dL (None) (None) (None) 2.6(L)   Lab Results  Component Value Date   BUN 13 12/21/2014   No results found for: HGBA1C Lab Results  Component Value Date   TSH 3.652 12/19/2014          Ct Abdomen Pelvis Wo Contrast  12/17/2014  CLINICAL DATA:  Vomiting beginning at noon today. History of diverticulitis, irritable bowel, chronic kidney disease, hypertension. EXAM: CT ABDOMEN AND PELVIS WITHOUT CONTRAST TECHNIQUE: Multidetector CT imaging of the abdomen and pelvis was performed following the standard protocol without IV contrast. Oral contrast administered. COMPARISON:  CT abdomen pelvis August 18, 2013 FINDINGS: LUNG BASES: Similar to slightly larger small bilateral pleural effusions. Associated mild compressive atelectasis. Heart size is normal. Mild coronary artery calcifications. No pericardial effusions. KIDNEYS/BLADDER: Kidneys are orthotopic, demonstrating normal size and morphology. No nephrolithiasis, hydronephrosis; limited assessment for renal masses on this nonenhanced examination. The unopacified ureters are normal in course and caliber. Urinary bladder is partially distended and unremarkable. SOLID ORGANS: The liver is diffusely and mildly dense, which can be seen with amiodarone use, otherwise unremarkable. Spleen, pancreas and adrenal glands are unremarkable for this non-contrast examination. Status post cholecystectomy.  GASTROINTESTINAL TRACT: Partially imaged moderate to large hiatal hernia. The stomach, small and large bowel are normal  in course and caliber . Mild small bowel wall thickening. Moderate sigmoid diverticulosis. The appendix is not discretely identified, however there are no inflammatory changes in the right lower quadrant. PERITONEUM/RETROPERITONEUM: " Misty " mesentery with small reactive lymph nodes. Aortoiliac vessels are normal in course and caliber, moderate to severe calcific atherosclerosis. No lymphadenopathy by CT size criteria. Status post hysterectomy. No intraperitoneal free fluid nor free air. SOFT TISSUES/ OSSEOUS STRUCTURES: Nonsuspicious. Small fat containing umbilical hernia. Severe thoracolumbar levoscoliosis. Osteopenia. Moderate chronic T11 compression fracture. IMPRESSION: Mild small bowel wall thickening, which can be seen with enteritis. " Misty " mesentery and small lymph nodes are likely reactive. Partially imaged moderate to large hiatal hernia. Moderate sigmoid diverticulosis without acute diverticulitis. Similar to slightly larger pleural effusions. Electronically Signed   By: Elon Alas M.D.   On: 12/17/2014 01:39   Dg Chest Port 1 View  12/16/2014  CLINICAL DATA:  Dyspnea, cough and vomiting for 1 day EXAM: PORTABLE CHEST 1 VIEW COMPARISON:  04/02/2013 FINDINGS: There is moderate unchanged cardiomegaly. There is a large hiatal hernia. No confluent airspace consolidation. Minimal streaky atelectatic appearing opacities are present adjacent to the hiatal hernia. No large effusion. Pulmonary vasculature is normal. IMPRESSION: Cardiomegaly. Hiatal hernia. Mild atelectatic appearing basilar opacities on the left. Electronically Signed   By: Andreas Newport M.D.   On: 12/16/2014 23:35     Assessment/Plan  1. Sepsis, due to unspecified organism [facility]) Resolved  2. Debility Significant debility as a result of recent sepsis and prolonged bed rest. May also be having some  issues related to her polymyalgia rheumatica and steroid dependency and the presence of sepsis.  3. Edema, unspecified type Etiology uncertain  4. Noninfected skin tear of leg, left, initial encounter Draining copious amounts of fluid  5. Effusion of left knee Small effusion in the left knee related to her osteoarthritis. Closely this will not hinder her mobility.  6. Essential hypertension Controlled  7. Polymyalgia rheumatica (HCC) Continue prednisone 5 mg daily  8. Chronic kidney disease, stage 2 (mild) Continue follow-up with nephrology  9. Colitis, collagenous Continues with occasional loose stools. This is a chronic condition.  10. Corticosteroid dependence (HCC) Continue prednisone 5 mg daily and follow electrolytes.

## 2014-12-24 LAB — STOOL CULTURE

## 2014-12-27 LAB — CBC AND DIFFERENTIAL
HCT: 37 % (ref 36–46)
Hemoglobin: 12.4 g/dL (ref 12.0–16.0)
Platelets: 260 10*3/uL (ref 150–399)
WBC: 9.3 10*3/mL

## 2014-12-27 LAB — BASIC METABOLIC PANEL
BUN: 63 mg/dL — AB (ref 4–21)
CREATININE: 1.8 mg/dL — AB (ref 0.5–1.1)
GLUCOSE: 63 mg/dL
POTASSIUM: 4.2 mmol/L (ref 3.4–5.3)
Sodium: 140 mmol/L (ref 137–147)

## 2014-12-28 ENCOUNTER — Non-Acute Institutional Stay (SKILLED_NURSING_FACILITY): Payer: Medicare Other | Admitting: Nurse Practitioner

## 2014-12-28 ENCOUNTER — Encounter: Payer: Self-pay | Admitting: Nurse Practitioner

## 2014-12-28 DIAGNOSIS — N183 Chronic kidney disease, stage 3 unspecified: Secondary | ICD-10-CM

## 2014-12-28 DIAGNOSIS — K52831 Collagenous colitis: Secondary | ICD-10-CM | POA: Diagnosis not present

## 2014-12-28 DIAGNOSIS — M353 Polymyalgia rheumatica: Secondary | ICD-10-CM | POA: Diagnosis not present

## 2014-12-28 DIAGNOSIS — F418 Other specified anxiety disorders: Secondary | ICD-10-CM | POA: Diagnosis not present

## 2014-12-28 DIAGNOSIS — E039 Hypothyroidism, unspecified: Secondary | ICD-10-CM

## 2014-12-28 DIAGNOSIS — K219 Gastro-esophageal reflux disease without esophagitis: Secondary | ICD-10-CM | POA: Insufficient documentation

## 2014-12-28 DIAGNOSIS — I10 Essential (primary) hypertension: Secondary | ICD-10-CM | POA: Diagnosis not present

## 2014-12-28 DIAGNOSIS — R609 Edema, unspecified: Secondary | ICD-10-CM

## 2014-12-28 DIAGNOSIS — K589 Irritable bowel syndrome without diarrhea: Secondary | ICD-10-CM | POA: Diagnosis not present

## 2014-12-28 DIAGNOSIS — K529 Noninfective gastroenteritis and colitis, unspecified: Secondary | ICD-10-CM

## 2014-12-28 NOTE — Assessment & Plan Note (Signed)
Stage III/IV 12/27/14 Bun 31, creat 1.82

## 2014-12-28 NOTE — Progress Notes (Signed)
Patient ID: Christina Cabrera, female   DOB: 05/16/1917, 81 y.o.   MRN: [id number]  Location:  SNF [facility] Provider:  Marlana Latus NP  Code Status:  DNR Goals of care: Advanced Directive information    Chief Complaint  Patient presents with  . Medical Management of Chronic Issues     HPI: Patient is a 81 y.o. female seen in the SNF at [facility] today for evaluation of blood pressure, enteritis, CKD, HTN, GERD    Hospitalized 12/16/14 to 12/21/14 for sepsis, source  likely gastroenteritis, dehydration with elevated lactic acid, Improved, patient tolerating solid diet without any difficulty. - CT of the abdomen and pelvis showed enteritis - CDiff toxin negative but antigen positive, likely colonization, usually does not require any treatment. However patient has symptoms of enteritis, hence patient was continued on IV Flagyl. She was transitioned to oral Flagyl for 9 more days. Probiotic was added.- Blood cultures remain negative.  Review of Systems  Constitutional: Negative for fever, chills and diaphoresis.  HENT: Negative for congestion, ear discharge, ear pain, hearing loss, sore throat and tinnitus.   Eyes: Negative for pain and redness.  Respiratory: Negative for cough, shortness of breath and wheezing.   Cardiovascular: Positive for leg swelling. Negative for chest pain and palpitations.  Gastrointestinal: Negative for nausea, abdominal pain, diarrhea and constipation.  Genitourinary: Negative for dysuria, urgency, frequency, hematuria and flank pain.  Musculoskeletal: Negative for myalgias, back pain and neck pain.  Skin: Negative for rash.       Recent skin tear  Neurological: Positive for weakness. Negative for dizziness, tremors, seizures and headaches.  Endo/Heme/Allergies: Negative for polydipsia. Does not bruise/bleed easily.  Psychiatric/Behavioral: Negative for suicidal ideas and hallucinations. The patient is not nervous/anxious.     Past Medical History    Diagnosis Date  . Hypertension   . Arthritis   . Diverticulitis   . Irritable bowel   . PMR (polymyalgia rheumatica) (HCC)     a. On chronic prednisone.  Marland Kitchen Spinal stenosis   . Hypothyroidism   . Chronic kidney disease     Stage III/IV  . Hyperlipidemia   . Cataract   . Anxiety   . Osteoporosis   . Symptomatic bradycardia     a. Adm 03/2013 - taken off clonidine and BB.  Marland Kitchen Premature atrial contractions     a. Noted during adm 03/2013, corresponding with patient's palpitations.  . Aortic stenosis     a. Mild AS by echo 03/2013.  Marland Kitchen Hypokalemia   . Colitis, collagenous 07/11/2011  . Corticosteroid dependence ([place]) 12/17/2014  . Polymyalgia rheumatica ([place]) 07/11/2011  . Cardiomegaly 12/23/2014  . Bilateral knee pain 06/10/2012  . DDD (degenerative disc disease), thoracolumbar 12/23/2014  . Debility 12/23/2014  . Diverticulosis of colon without hemorrhage 12/23/2014  . Dysphagia 12/23/2014  . Edema 12/23/2014  . Effusion of left knee 12/23/2014  . Enteritis 12/17/2014  . Hiatal hernia 12/23/2014  . HNP (herniated nucleus pulposus), lumbar 12/23/2014    L 4-5   . IBS (irritable bowel syndrome) 12/23/2014  . Noninfected skin tear of leg 12/23/2014    Left leg   . Osteoarthritis of left knee 12/23/2014  . Scoliosis (and kyphoscoliosis), idiopathic 12/23/2014  . Spinal stenosis of lumbar region 12/23/2014  . T11 vertebral fracture ([place]) 12/23/2014  . Unstable gait 12/23/2014    Patient Active Problem List   Diagnosis Date Noted  . GERD (gastroesophageal reflux disease) 12/28/2014  . Depression with anxiety 12/28/2014  . Debility 12/23/2014  .  Osteoarthritis of left knee 12/23/2014  . Effusion of left knee 12/23/2014  . Noninfected skin tear of leg 12/23/2014  . Scoliosis (and kyphoscoliosis), idiopathic 12/23/2014  . T11 vertebral fracture ([place]) 12/23/2014  . Unstable gait 12/23/2014  . Edema 12/23/2014  . Dysphagia 12/23/2014  . Spinal stenosis of lumbar region 12/23/2014  . DDD  (degenerative disc disease), thoracolumbar 12/23/2014  . IBS (irritable bowel syndrome) 12/23/2014  . Hiatal hernia 12/23/2014  . Diverticulosis of colon without hemorrhage 12/23/2014  . Cardiomegaly 12/23/2014  . HNP (herniated nucleus pulposus), lumbar 12/23/2014  . Enteritis 12/17/2014  . Sepsis ([place]) 12/17/2014  . Corticosteroid dependence ([place]) 12/17/2014  . Premature atrial contractions   . Aortic stenosis   . Symptomatic bradycardia 04/02/2013  . Chronic kidney disease, stage III/IV   . Bilateral knee pain 06/10/2012  . Polymyalgia rheumatica ([place]) 07/11/2011  . Colitis, collagenous 07/11/2011  . Hypothyroidism 07/11/2011  . HTN (hypertension) 07/11/2011    Allergies  Allergen Reactions  . Codeine Nausea And Vomiting  . Fosamax [Alendronate Sodium]     Unknown.   Dion Saucier [Calcitonin]     Unknown.  . Nsaids     Due to renal function.   . Quinolones Other (See Comments)    platelets  . Sulfa Antibiotics Nausea And Vomiting    Medications: Patient's Medications  New Prescriptions   No medications on file  Previous Medications   ALPRAZOLAM (XANAX) 0.5 MG TABLET    Take 0.25 mg by mouth 4 (four) times daily.    AMLODIPINE (NORVASC) 5 MG TABLET    Take 5 mg by mouth daily.   CHOLECALCIFEROL (VITAMIN D) 2000 UNITS CAPS    Take 2,000 Units by mouth daily.   FUROSEMIDE (LASIX) 40 MG TABLET    Take 40 mg by mouth every morning.    HYDRALAZINE (APRESOLINE) 50 MG TABLET    Take 1 tablet (50 mg total) by mouth 3 (three) times daily. (Every 8 hours)   HYDROCODONE-ACETAMINOPHEN (NORCO) 10-325 MG TABLET    Take 1 tablet by mouth every 6 (six) hours as needed. pain   LEVOTHYROXINE (SYNTHROID, LEVOTHROID) 50 MCG TABLET    Take 50 mcg by mouth daily.    METRONIDAZOLE (FLAGYL) 500 MG TABLET    Take 1 tablet (500 mg total) by mouth 3 (three) times daily. X9 more days   MULTIPLE VITAMINS-MINERALS (PRESERVISION AREDS PO)    Take 1 tablet by mouth 2 (two) times daily.   OMEPRAZOLE  (PRILOSEC) 20 MG CAPSULE    Take 20 mg by mouth daily.   POLYETHYLENE GLYCOL (MIRALAX / GLYCOLAX) PACKET    Take 17 g by mouth daily. For constipation. Change to as needed daily if diaarhea or more than 2 BM's in a day   POTASSIUM CHLORIDE SA (K-DUR,KLOR-CON) 20 MEQ TABLET    Take 1 tablet (20 mEq total) by mouth 2 (two) times daily.   PREDNISONE (DELTASONE) 1 MG TABLET    Take 1 mg by mouth daily with breakfast. Take with prednisone 5mg    PREDNISONE (DELTASONE) 5 MG TABLET    Take 5 mg by mouth every morning. Take with prednisone 1 mg   PROMETHAZINE (PHENERGAN) 12.5 MG TABLET    Take 1 tablet (12.5 mg total) by mouth every 6 (six) hours as needed for nausea or vomiting.   SACCHAROMYCES BOULARDII (FLORASTOR) 250 MG CAPSULE    Take 1 capsule (250 mg total) by mouth 2 (two) times daily. May change to generic available at the pharmacy  SERTRALINE (ZOLOFT) 25 MG TABLET    Take 25 mg by mouth at bedtime.   Modified Medications   No medications on file  Discontinued Medications   No medications on file    Physical Exam: Filed Vitals:   12/28/14 0954  BP: 122/84  Pulse: 78  Temp: 97.9 F (36.6 C)  TempSrc: Tympanic  Resp: 20   There is no weight on file to calculate BMI.  Physical Exam  Constitutional: She is oriented to person, place, and time. She appears well-developed and well-nourished. No distress.  Overweight  HENT:  Right Ear: External ear normal.  Left Ear: External ear normal.  Nose: Nose normal.  Mouth/Throat: Oropharynx is clear and moist. No oropharyngeal exudate.  Eyes: Conjunctivae and EOM are normal. Pupils are equal, round, and reactive to light. No scleral icterus.  Neck: No JVD present. No tracheal deviation present. No thyromegaly present.  Cardiovascular: Normal rate, regular rhythm, normal heart sounds and intact distal pulses.  Exam reveals no gallop and no friction rub.   No murmur heard. Pulmonary/Chest: Effort normal. No respiratory distress. She has no  wheezes. She has no rales. She exhibits no tenderness.  Abdominal: She exhibits no distension and no mass. There is no tenderness.  Musculoskeletal: Normal range of motion. She exhibits no edema or tenderness.  Generalized weakness. Left knee effusion.  Lymphadenopathy:    She has no cervical adenopathy.  Neurological: She is alert and oriented to person, place, and time. No cranial nerve deficit. Coordination normal.  Skin: No rash noted. She is not diaphoretic. No erythema. No pallor.  Small triangular shaped skin tear of the left lower leg anteriorly that is weeping copious amounts of serous fluid. No infection.  Psychiatric: She has a normal mood and affect. Her behavior is normal. Judgment and thought content normal.    Labs reviewed: Basic Metabolic Panel:  Recent Labs  12/17/14 1027  12/19/14 0455 12/20/14 0522 12/21/14 0558 12/27/14  NA  --   < > 143 141 139 140  K  --   < > 4.3 4.9 4.0 4.2  CL  --   < > 114* 115* 107  --   CO2  --   < > 22 22 22   --   GLUCOSE  --   < > 93 90 89  --   BUN  --   < > 16 13 13  63*  CREATININE  --   < > 1.26* 1.11* 1.29* 1.8*  CALCIUM  --   < > 8.2* 8.4* 8.7*  --   MG 1.9  --  1.8  --   --   --   < > = values in this interval not displayed.  Liver Function Tests:  Recent Labs  12/16/14 2141 12/18/14 0458  AST 28 26  ALT 13* 15  ALKPHOS 59 47  BILITOT 0.8 0.5  PROT 6.0* 5.0*  ALBUMIN 3.6 2.6*    CBC:  Recent Labs  12/16/14 2141  12/19/14 0455 12/20/14 0522 12/21/14 0558 12/27/14  WBC 7.3  < > 7.9 5.8 7.4 9.3  NEUTROABS 6.0  --   --   --   --   --   HGB 13.3  < > 10.6* 10.4* 12.0 12.4  HCT 40.1  < > 33.2* 32.4* 38.5 37  MCV 86.2  < > 89.7 89.5 88.3  --   PLT 144*  < > 129* 128* 179 260  < > = values in this interval not displayed.  Lab  Results  Component Value Date   TSH 3.652 12/19/2014   No results found for: HGBA1C No results found for: CHOL, HDL, LDLCALC, LDLDIRECT, TRIG, CHOLHDL  Significant Diagnostic  Results since last visit: none  Patient Care Team: Hulan Fess, MD as PCP - General (Family Medicine) Laurence Spates, MD as Consulting Physician (Gastroenterology) Hennie Duos, MD as Consulting Physician (Rheumatology) Corliss Parish, MD as Consulting Physician (Nephrology) Jettie Booze, MD as Consulting Physician (Cardiology) Glenna Fellows, MD as Attending Physician (Neurosurgery) Susa Day, MD as Consulting Physician (Orthopedic Surgery)  Assessment/Plan Problem List Items Addressed This Visit    Chronic kidney disease, stage III/IV (Chronic)    Stage III/IV 12/27/14 Bun 31, creat 1.82      Colitis, collagenous (Chronic)    Continue Prednisone 6mg  qd      Depression with anxiety    Stable, continue Sertraline 25mg , Alprazolam 0.25mg  qid.       Edema    Trace edema BLE, continue Furosemide 40mg       Enteritis    Will complete 9 more days of Metronidazole 500mg  tid.       GERD (gastroesophageal reflux disease)    Stable, continue Omeprazole 20mg  daily.       HTN (hypertension) (Chronic)    Controlled, continue Amlodipine 50mg , Hydralazi ne 50mg  tid, Furosemide 40mg  daily.       Hypothyroidism - Primary (Chronic)    Continue Levothyroxine 67mcg, update TSH      IBS (irritable bowel syndrome)    Stable, continue MiraLax daily.       Polymyalgia rheumatica (HCC) (Chronic)    Continue Prednisone 6mg  qd          Family/ staff Communication: here for Rehab, [place] when able.   Labs/tests ordered:  TSH   ManXie Korrey Schleicher NP [facility] [address], [place] [address] On Call:  [phone] & follow prompts after 5pm & weekends Office Phone:  [phone] Office Fax:  [phone]

## 2014-12-28 NOTE — Assessment & Plan Note (Signed)
Stable, continue MiraLax daily.  

## 2014-12-28 NOTE — Assessment & Plan Note (Signed)
Continue Prednisone 6mg  qd

## 2014-12-28 NOTE — Assessment & Plan Note (Signed)
Controlled, continue Amlodipine 50mg, Hydralazi ne 50mg tid, Furosemide 40mg daily. 

## 2014-12-28 NOTE — Assessment & Plan Note (Signed)
Continue Levothyroxine 52mcg, update TSH

## 2014-12-28 NOTE — Assessment & Plan Note (Signed)
Will complete 9 more days of Metronidazole 500mg  tid.

## 2014-12-28 NOTE — Assessment & Plan Note (Signed)
Stable, continue Sertraline 25mg , Alprazolam 0.25mg  qid.

## 2014-12-28 NOTE — Assessment & Plan Note (Signed)
Trace edema BLE, continue Furosemide 40mg 

## 2014-12-28 NOTE — Assessment & Plan Note (Signed)
Stable, continue Omeprazole 20mg daily.  

## 2014-12-29 ENCOUNTER — Encounter: Payer: Self-pay | Admitting: *Deleted

## 2014-12-29 DIAGNOSIS — R41841 Cognitive communication deficit: Secondary | ICD-10-CM | POA: Diagnosis not present

## 2014-12-29 LAB — TSH: TSH: 4.4 u[IU]/mL (ref <=5.90)

## 2015-01-03 DIAGNOSIS — R41841 Cognitive communication deficit: Secondary | ICD-10-CM | POA: Diagnosis not present

## 2015-01-20 DIAGNOSIS — E782 Mixed hyperlipidemia: Secondary | ICD-10-CM | POA: Diagnosis not present

## 2015-01-20 DIAGNOSIS — H6120 Impacted cerumen, unspecified ear: Secondary | ICD-10-CM | POA: Diagnosis not present

## 2015-01-20 DIAGNOSIS — N184 Chronic kidney disease, stage 4 (severe): Secondary | ICD-10-CM | POA: Diagnosis not present

## 2015-01-20 DIAGNOSIS — K5289 Other specified noninfective gastroenteritis and colitis: Secondary | ICD-10-CM | POA: Diagnosis not present

## 2015-01-20 DIAGNOSIS — E559 Vitamin D deficiency, unspecified: Secondary | ICD-10-CM | POA: Diagnosis not present

## 2015-01-20 DIAGNOSIS — I10 Essential (primary) hypertension: Secondary | ICD-10-CM | POA: Diagnosis not present

## 2015-01-20 DIAGNOSIS — E778 Other disorders of glycoprotein metabolism: Secondary | ICD-10-CM | POA: Diagnosis not present

## 2015-01-20 DIAGNOSIS — Z0000 Encounter for general adult medical examination without abnormal findings: Secondary | ICD-10-CM | POA: Diagnosis not present

## 2015-01-20 DIAGNOSIS — M353 Polymyalgia rheumatica: Secondary | ICD-10-CM | POA: Diagnosis not present

## 2015-01-20 DIAGNOSIS — R8290 Unspecified abnormal findings in urine: Secondary | ICD-10-CM | POA: Diagnosis not present

## 2015-01-20 DIAGNOSIS — F419 Anxiety disorder, unspecified: Secondary | ICD-10-CM | POA: Diagnosis not present

## 2015-02-12 ENCOUNTER — Emergency Department (HOSPITAL_COMMUNITY): Payer: Medicare Other

## 2015-02-12 ENCOUNTER — Emergency Department (HOSPITAL_COMMUNITY)
Admission: EM | Admit: 2015-02-12 | Discharge: 2015-02-12 | Disposition: A | Discharge status: Home / Self Care | Payer: Medicare Other | Attending: Emergency Medicine | Admitting: Emergency Medicine

## 2015-02-12 ENCOUNTER — Encounter (HOSPITAL_COMMUNITY): Payer: Self-pay | Admitting: Emergency Medicine

## 2015-02-12 DIAGNOSIS — F419 Anxiety disorder, unspecified: Secondary | ICD-10-CM | POA: Insufficient documentation

## 2015-02-12 DIAGNOSIS — S79911A Unspecified injury of right hip, initial encounter: Secondary | ICD-10-CM | POA: Diagnosis not present

## 2015-02-12 DIAGNOSIS — S20212A Contusion of left front wall of thorax, initial encounter: Secondary | ICD-10-CM | POA: Diagnosis not present

## 2015-02-12 DIAGNOSIS — N184 Chronic kidney disease, stage 4 (severe): Secondary | ICD-10-CM | POA: Insufficient documentation

## 2015-02-12 DIAGNOSIS — Y9389 Activity, other specified: Secondary | ICD-10-CM | POA: Insufficient documentation

## 2015-02-12 DIAGNOSIS — Y92009 Unspecified place in unspecified non-institutional (private) residence as the place of occurrence of the external cause: Secondary | ICD-10-CM | POA: Diagnosis not present

## 2015-02-12 DIAGNOSIS — S20219A Contusion of unspecified front wall of thorax, initial encounter: Secondary | ICD-10-CM

## 2015-02-12 DIAGNOSIS — Y998 Other external cause status: Secondary | ICD-10-CM | POA: Diagnosis not present

## 2015-02-12 DIAGNOSIS — W19XXXA Unspecified fall, initial encounter: Secondary | ICD-10-CM

## 2015-02-12 DIAGNOSIS — E039 Hypothyroidism, unspecified: Secondary | ICD-10-CM | POA: Insufficient documentation

## 2015-02-12 DIAGNOSIS — S0990XA Unspecified injury of head, initial encounter: Secondary | ICD-10-CM | POA: Insufficient documentation

## 2015-02-12 DIAGNOSIS — Z8781 Personal history of (healed) traumatic fracture: Secondary | ICD-10-CM | POA: Insufficient documentation

## 2015-02-12 DIAGNOSIS — E876 Hypokalemia: Secondary | ICD-10-CM | POA: Diagnosis not present

## 2015-02-12 DIAGNOSIS — M810 Age-related osteoporosis without current pathological fracture: Secondary | ICD-10-CM | POA: Diagnosis not present

## 2015-02-12 DIAGNOSIS — M1712 Unilateral primary osteoarthritis, left knee: Secondary | ICD-10-CM | POA: Insufficient documentation

## 2015-02-12 DIAGNOSIS — S20211A Contusion of right front wall of thorax, initial encounter: Secondary | ICD-10-CM | POA: Insufficient documentation

## 2015-02-12 DIAGNOSIS — Z8669 Personal history of other diseases of the nervous system and sense organs: Secondary | ICD-10-CM | POA: Insufficient documentation

## 2015-02-12 DIAGNOSIS — W01198A Fall on same level from slipping, tripping and stumbling with subsequent striking against other object, initial encounter: Secondary | ICD-10-CM | POA: Insufficient documentation

## 2015-02-12 DIAGNOSIS — I129 Hypertensive chronic kidney disease with stage 1 through stage 4 chronic kidney disease, or unspecified chronic kidney disease: Secondary | ICD-10-CM | POA: Insufficient documentation

## 2015-02-12 DIAGNOSIS — M542 Cervicalgia: Secondary | ICD-10-CM | POA: Diagnosis not present

## 2015-02-12 DIAGNOSIS — Z8719 Personal history of other diseases of the digestive system: Secondary | ICD-10-CM | POA: Diagnosis not present

## 2015-02-12 DIAGNOSIS — Z7952 Long term (current) use of systemic steroids: Secondary | ICD-10-CM | POA: Insufficient documentation

## 2015-02-12 DIAGNOSIS — Z79899 Other long term (current) drug therapy: Secondary | ICD-10-CM | POA: Diagnosis not present

## 2015-02-12 DIAGNOSIS — R42 Dizziness and giddiness: Secondary | ICD-10-CM | POA: Diagnosis not present

## 2015-02-12 DIAGNOSIS — R0781 Pleurodynia: Secondary | ICD-10-CM | POA: Diagnosis not present

## 2015-02-12 DIAGNOSIS — R404 Transient alteration of awareness: Secondary | ICD-10-CM | POA: Diagnosis not present

## 2015-02-12 DIAGNOSIS — M25511 Pain in right shoulder: Secondary | ICD-10-CM | POA: Diagnosis not present

## 2015-02-12 DIAGNOSIS — S064X0A Epidural hemorrhage without loss of consciousness, initial encounter: Secondary | ICD-10-CM | POA: Diagnosis not present

## 2015-02-12 DIAGNOSIS — S299XXA Unspecified injury of thorax, initial encounter: Secondary | ICD-10-CM | POA: Diagnosis present

## 2015-02-12 LAB — BASIC METABOLIC PANEL
ANION GAP: 13 (ref 5–15)
BUN: 48 mg/dL — AB (ref 6–20)
CALCIUM: 9.7 mg/dL (ref 8.9–10.3)
CO2: 24 mmol/L (ref 22–32)
Chloride: 107 mmol/L (ref 101–111)
Creatinine, Ser: 1.71 mg/dL — ABNORMAL HIGH (ref 0.44–1.00)
GFR calc Af Amer: 28 mL/min — ABNORMAL LOW (ref >60)
GFR, EST NON AFRICAN AMERICAN: 24 mL/min — AB (ref >60)
GLUCOSE: 100 mg/dL — AB (ref 65–99)
Potassium: 3.5 mmol/L (ref 3.5–5.1)
SODIUM: 144 mmol/L (ref 135–145)

## 2015-02-12 LAB — CBC WITH DIFFERENTIAL/PLATELET
BASOS ABS: 0 10*3/uL (ref 0.0–0.1)
Basophils Relative: 0 %
EOS ABS: 0.1 10*3/uL (ref 0.0–0.7)
EOS PCT: 1 %
HCT: 38.7 % (ref 36.0–46.0)
Hemoglobin: 12.5 g/dL (ref 12.0–15.0)
LYMPHS PCT: 17 %
Lymphs Abs: 1.7 10*3/uL (ref 0.7–4.0)
MCH: 28.3 pg (ref 26.0–34.0)
MCHC: 32.3 g/dL (ref 30.0–36.0)
MCV: 87.8 fL (ref 78.0–100.0)
Monocytes Absolute: 2.3 10*3/uL — ABNORMAL HIGH (ref 0.1–1.0)
Monocytes Relative: 23 %
Neutro Abs: 6 10*3/uL (ref 1.7–7.7)
Neutrophils Relative %: 59 %
PLATELETS: 171 10*3/uL (ref 150–400)
RBC: 4.41 MIL/uL (ref 3.87–5.11)
RDW: 14.6 % (ref 11.5–15.5)
WBC: 10.1 10*3/uL (ref 4.0–10.5)

## 2015-02-12 LAB — PROTIME-INR
INR: 1.08 (ref 0.00–1.49)
PROTHROMBIN TIME: 13.8 s (ref 11.6–15.2)

## 2015-02-12 MED ORDER — MORPHINE SULFATE (PF) 4 MG/ML IV SOLN
4.0000 mg | Freq: Once | INTRAVENOUS | Status: AC
  Administered 2015-02-12: 4 mg via INTRAVENOUS
  Filled 2015-02-12: qty 1

## 2015-02-12 NOTE — ED Provider Notes (Signed)
CSN: [id number]     Arrival date & time 02/12/15  0255 History  By signing my name below, I, Dora Sims, attest that this documentation has been prepared under the direction and in the presence of Dorie Rank, MD . Electronically Signed: Dora Sims, Scribe. 02/12/2015. 2:56 AM.    Chief Complaint  Patient presents with  . Fall  . Head Injury    Patient is a 82 y.o. female presenting with fall. The history is provided by the patient. No language interpreter was used.  Fall This is a new problem. The current episode started 6 to 12 hours ago. The problem occurs constantly. The problem has not changed since onset.Nothing aggravates the symptoms. Nothing relieves the symptoms.     HPI Comments: Christina Cabrera brought in by EMS from [facility] is a 82 y.o. female with h/o of Osteoporosis, HTN, HLD, and Chronic Kidney Disease who presents to the Emergency Department complaining of sudden onset head injury s/p falling PTA. She reports that she got out of bed to turn her light off when she suddenly felt dizzy and fell, initially hitting her right ribs and then hitting her head on a bookcase. She states that she took Hydrocodone and Xanax s/p falling with mild relief. Pt reports that she is able to ambulate. She states that she is currently hurting in her right ribs, and the pain is exacerbated with breathing, movement and palpation to area. She denies experiencing LOC. Pt denies neck pain, back pain, numbness, weakness, nausea, vomiting, slurred speech, fever, chills, or any other associated symptoms at this time. She has a PSHx of back surgery, appendectomy, cholecystectomy, and hysterectomy.    Past Medical History  Diagnosis Date  . Hypertension   . Arthritis   . Diverticulitis   . Irritable bowel   . PMR (polymyalgia rheumatica) (HCC)     a. On chronic prednisone.  Marland Kitchen Spinal stenosis   . Hypothyroidism   . Chronic kidney disease     Stage III/IV  . Hyperlipidemia   . Cataract    . Anxiety   . Osteoporosis   . Symptomatic bradycardia     a. Adm 03/2013 - taken off clonidine and BB.  Marland Kitchen Premature atrial contractions     a. Noted during adm 03/2013, corresponding with patient's palpitations.  . Aortic stenosis     a. Mild AS by echo 03/2013.  Marland Kitchen Hypokalemia   . Colitis, collagenous 07/11/2011  . Corticosteroid dependence ([place]) 12/17/2014  . Polymyalgia rheumatica ([place]) 07/11/2011  . Cardiomegaly 12/23/2014  . Bilateral knee pain 06/10/2012  . DDD (degenerative disc disease), thoracolumbar 12/23/2014  . Debility 12/23/2014  . Diverticulosis of colon without hemorrhage 12/23/2014  . Dysphagia 12/23/2014  . Edema 12/23/2014  . Effusion of left knee 12/23/2014  . Enteritis 12/17/2014  . Hiatal hernia 12/23/2014  . HNP (herniated nucleus pulposus), lumbar 12/23/2014    L 4-5   . IBS (irritable bowel syndrome) 12/23/2014  . Noninfected skin tear of leg 12/23/2014    Left leg   . Osteoarthritis of left knee 12/23/2014  . Scoliosis (and kyphoscoliosis), idiopathic 12/23/2014  . Spinal stenosis of lumbar region 12/23/2014  . T11 vertebral fracture ([place]) 12/23/2014  . Unstable gait 12/23/2014   Past Surgical History  Procedure Laterality Date  . Back surgery    . Appendectomy    . Cholecystectomy    . Eye surgery    . Abdominal hysterectomy  1966  . Breast biopsies     Family  History  Problem Relation Age of Onset  . Hyperlipidemia Mother   . Hypertension Mother   . Hypertension Father   . Hyperlipidemia Father   . Hyperlipidemia Sister   . Hypertension Sister   . Diabetes Neg Hx   . Heart attack Neg Hx   . Sudden death Neg Hx    Social History  Substance Use Topics  . Smoking status: Never Smoker   . Smokeless tobacco: Never Used  . Alcohol Use: No   OB History    No data available     Review of Systems  Constitutional: Negative for fever and chills.  Gastrointestinal: Negative for nausea and vomiting.  Musculoskeletal: Positive for arthralgias (head).  Negative for back pain and neck pain.  Neurological: Positive for dizziness. Negative for syncope, speech difficulty, weakness and numbness.  All other systems reviewed and are negative.     Allergies  Codeine; Fosamax; Miacalcin; Nsaids; Quinolones; and Sulfa antibiotics  Home Medications   Prior to Admission medications   Medication Sig Start Date End Date Taking? Authorizing Provider  ALPRAZolam Duanne Moron) 0.5 MG tablet Take 0.25 mg by mouth 4 (four) times daily.    Yes Historical Provider, MD  amLODipine (NORVASC) 5 MG tablet Take 5 mg by mouth daily.   Yes Historical Provider, MD  Cholecalciferol (VITAMIN D) 2000 UNITS CAPS Take 2,000 Units by mouth daily.   Yes Historical Provider, MD  furosemide (LASIX) 40 MG tablet Take 40 mg by mouth every morning.    Yes Historical Provider, MD  hydrALAZINE (APRESOLINE) 50 MG tablet Take 1 tablet (50 mg total) by mouth 3 (three) times daily. (Every 8 hours) 04/04/13  Yes Dayna N Dunn, PA-C  HYDROcodone-acetaminophen (NORCO) 10-325 MG tablet Take 1 tablet by mouth every 6 (six) hours as needed. pain 12/21/14  Yes Ripudeep Krystal Eaton, MD  levothyroxine (SYNTHROID, LEVOTHROID) 50 MCG tablet Take 50 mcg by mouth daily.    Yes Historical Provider, MD  Multiple Vitamins-Minerals (PRESERVISION AREDS PO) Take 1 tablet by mouth 2 (two) times daily.   Yes Historical Provider, MD  omeprazole (PRILOSEC) 20 MG capsule Take 20 mg by mouth daily.   Yes Historical Provider, MD  polyethylene glycol (MIRALAX / GLYCOLAX) packet Take 17 g by mouth daily. For constipation. Change to as needed daily if diaarhea or more than 2 BM's in a day 12/21/14  Yes Ripudeep K Rai, MD  potassium chloride SA (K-DUR,KLOR-CON) 20 MEQ tablet Take 1 tablet (20 mEq total) by mouth 2 (two) times daily. 04/04/13  Yes Dayna N Dunn, PA-C  predniSONE (DELTASONE) 1 MG tablet Take 1 mg by mouth daily with breakfast. Take with prednisone 5mg    Yes Historical Provider, MD  predniSONE (DELTASONE) 5 MG tablet  Take 5 mg by mouth every morning. Take with prednisone 1 mg   Yes Historical Provider, MD  saccharomyces boulardii (FLORASTOR) 250 MG capsule Take 1 capsule (250 mg total) by mouth 2 (two) times daily. May change to generic available at the pharmacy 12/21/14  Yes Ripudeep Krystal Eaton, MD  sertraline (ZOLOFT) 25 MG tablet Take 25 mg by mouth at bedtime.    Yes Historical Provider, MD  metroNIDAZOLE (FLAGYL) 500 MG tablet Take 1 tablet (500 mg total) by mouth 3 (three) times daily. X9 more days Patient not taking: Reported on 02/12/2015 12/21/14   Ripudeep Krystal Eaton, MD  promethazine (PHENERGAN) 12.5 MG tablet Take 1 tablet (12.5 mg total) by mouth every 6 (six) hours as needed for nausea or vomiting. Patient  not taking: Reported on 02/12/2015 12/21/14   Ripudeep K Rai, MD   BP 96/83 mmHg  Pulse 56  Temp(Src) 98.6 F (37 C) (Oral)  Resp 19  SpO2 96% Physical Exam  Constitutional: No distress.  elderly  HENT:  Head: Normocephalic and atraumatic.  Right Ear: External ear normal.  Left Ear: External ear normal.  TTP posterior occiput  Eyes: Conjunctivae are normal. Right eye exhibits no discharge. Left eye exhibits no discharge. No scleral icterus.  Neck: Neck supple. No tracheal deviation present.  Cardiovascular: Normal rate, regular rhythm and intact distal pulses.   Pulmonary/Chest: Effort normal and breath sounds normal. No stridor. No respiratory distress. She has no wheezes. She has no rales. She exhibits bony tenderness. She exhibits no crepitus.  TTP left and right posterior ribs  Abdominal: Soft. Bowel sounds are normal. She exhibits no distension. There is no tenderness. There is no rebound and no guarding.  Musculoskeletal: She exhibits no edema or tenderness.       Cervical back: She exhibits no bony tenderness and no swelling.       Thoracic back: She exhibits no bony tenderness and no swelling.       Lumbar back: She exhibits no bony tenderness and no swelling.  Neurological: She is alert.  She has normal strength. No cranial nerve deficit (no facial droop, extraocular movements intact, no slurred speech) or sensory deficit. She exhibits normal muscle tone. She displays no seizure activity. Coordination normal.  Skin: Skin is warm and dry. No rash noted.  Psychiatric: She has a normal mood and affect.  Nursing note and vitals reviewed.   ED Course  Procedures (including critical care time)  DIAGNOSTIC STUDIES: Oxygen Saturation is 96% on RA, adequate by my interpretation.    COORDINATION OF CARE:  2:57 AM Will order CT Head w/o Contrast, CT C-Spine w/o Contrast, Discussed treatment plan with pt at bedside and pt agreed to plan.   Labs Review Labs Reviewed  CBC WITH DIFFERENTIAL/PLATELET - Abnormal; Notable for the following:    Monocytes Absolute 2.3 (*)    All other components within normal limits  BASIC METABOLIC PANEL - Abnormal; Notable for the following:    Glucose, Bld 100 (*)    BUN 48 (*)    Creatinine, Ser 1.71 (*)    GFR calc non Af Amer 24 (*)    GFR calc Af Amer 28 (*)    All other components within normal limits  PROTIME-INR    Imaging Review Dg Ribs Bilateral W/chest  02/12/2015  CLINICAL DATA:  Status post fall while getting out of bed, with right rib pain. Initial encounter. EXAM: BILATERAL RIBS AND CHEST - 4+ VIEW COMPARISON:  Chest radiograph from 12/16/2014 FINDINGS: No acute displaced rib fractures are seen. The lungs are well-aerated and clear. There is no evidence of focal opacification, pleural effusion or pneumothorax. The cardiomediastinal silhouette is mildly enlarged. No acute osseous abnormalities are seen. Chronic right lower rib deformities are noted. Left convex thoracolumbar scoliosis is noted. IMPRESSION: 1. No acute displaced rib fracture seen. Chronic right lower rib deformities noted. 2. No acute cardiopulmonary process seen.  Mild cardiomegaly noted. 3. Left convex thoracolumbar scoliosis noted. Electronically Signed   By: Garald Balding M.D.   On: 02/12/2015 04:34   Dg Shoulder Right  02/12/2015  CLINICAL DATA:  Initial evaluation for acute trauma, fall. Acute shoulder pain. EXAM: RIGHT SHOULDER - 2+ VIEW COMPARISON:  None. FINDINGS: No acute fracture or dislocation. Humeral head in  normal alignment with the glenoid. AC joint approximated. The humeral head is somewhat high-riding relative to the glenoid, which may reflect underlying rotator cuff disease. Mild osteopenia. Mild degenerative spurring at the inferior aspect of glenoid. No acute soft tissue abnormality. IMPRESSION: 1. No acute fracture or dislocation. 2. Humeral head mildly high-riding relative to the glenoid, suggesting underlying rotator cuff disease. Electronically Signed   By: Jeannine Boga M.D.   On: 02/12/2015 04:39   Ct Head Wo Contrast  02/12/2015  CLINICAL DATA:  Status post fall, with head injury. Dizziness and small posterior scalp hematoma. Neck pain. Initial encounter. EXAM: CT HEAD WITHOUT CONTRAST CT CERVICAL SPINE WITHOUT CONTRAST TECHNIQUE: Multidetector CT imaging of the head and cervical spine was performed following the standard protocol without intravenous contrast. Multiplanar CT image reconstructions of the cervical spine were also generated. COMPARISON:  CT of the head and cervical spine performed 04/12/2013 FINDINGS: CT HEAD FINDINGS There is no evidence of acute infarction, mass lesion, or intra- or extra-axial hemorrhage on CT. Prominence of the sulci suggests mild cortical volume loss. Mild periventricular and subcortical white matter change likely reflects small vessel ischemic microangiopathy. Mild chronic ischemic change is noted at the basal ganglia bilaterally. The brainstem and fourth ventricle are within normal limits. The cerebral hemispheres demonstrate grossly normal gray-white differentiation. No mass effect or midline shift is seen. There is no evidence of fracture; visualized osseous structures are unremarkable in appearance.  The orbits are within normal limits. The paranasal sinuses and mastoid air cells are well-aerated. No significant soft tissue abnormalities are seen. CT CERVICAL SPINE FINDINGS There is no evidence of acute fracture or subluxation. Vertebral bodies demonstrate normal height. There is mild grade 1 anterolisthesis of C4 on C5, and multilevel disc space narrowing along the lower cervical spine, with small posterior disc osteophyte complexes. Mild underlying facet disease is noted. Prevertebral soft tissues are within normal limits. The thyroid gland is diminutive and unremarkable in appearance. The visualized lung apices are clear. Mild calcification is noted at the carotid bifurcations bilaterally. IMPRESSION: 1. No evidence of traumatic intracranial injury or fracture. 2. No evidence of acute fracture or subluxation along the cervical spine. 3. Mild cortical volume loss and scattered small vessel ischemic microangiopathy. 4. Mild chronic ischemic change at the basal ganglia bilaterally. 5. Mild degenerative change along the mid to lower cervical spine. 6. Mild calcification at the carotid bifurcations bilaterally. Carotid ultrasound could be considered for further evaluation, when and as deemed clinically appropriate. Electronically Signed   By: Garald Balding M.D.   On: 02/12/2015 04:12   Ct Cervical Spine Wo Contrast  02/12/2015  CLINICAL DATA:  Status post fall, with head injury. Dizziness and small posterior scalp hematoma. Neck pain. Initial encounter. EXAM: CT HEAD WITHOUT CONTRAST CT CERVICAL SPINE WITHOUT CONTRAST TECHNIQUE: Multidetector CT imaging of the head and cervical spine was performed following the standard protocol without intravenous contrast. Multiplanar CT image reconstructions of the cervical spine were also generated. COMPARISON:  CT of the head and cervical spine performed 04/12/2013 FINDINGS: CT HEAD FINDINGS There is no evidence of acute infarction, mass lesion, or intra- or extra-axial  hemorrhage on CT. Prominence of the sulci suggests mild cortical volume loss. Mild periventricular and subcortical white matter change likely reflects small vessel ischemic microangiopathy. Mild chronic ischemic change is noted at the basal ganglia bilaterally. The brainstem and fourth ventricle are within normal limits. The cerebral hemispheres demonstrate grossly normal gray-white differentiation. No mass effect or midline shift is seen. There  is no evidence of fracture; visualized osseous structures are unremarkable in appearance. The orbits are within normal limits. The paranasal sinuses and mastoid air cells are well-aerated. No significant soft tissue abnormalities are seen. CT CERVICAL SPINE FINDINGS There is no evidence of acute fracture or subluxation. Vertebral bodies demonstrate normal height. There is mild grade 1 anterolisthesis of C4 on C5, and multilevel disc space narrowing along the lower cervical spine, with small posterior disc osteophyte complexes. Mild underlying facet disease is noted. Prevertebral soft tissues are within normal limits. The thyroid gland is diminutive and unremarkable in appearance. The visualized lung apices are clear. Mild calcification is noted at the carotid bifurcations bilaterally. IMPRESSION: 1. No evidence of traumatic intracranial injury or fracture. 2. No evidence of acute fracture or subluxation along the cervical spine. 3. Mild cortical volume loss and scattered small vessel ischemic microangiopathy. 4. Mild chronic ischemic change at the basal ganglia bilaterally. 5. Mild degenerative change along the mid to lower cervical spine. 6. Mild calcification at the carotid bifurcations bilaterally. Carotid ultrasound could be considered for further evaluation, when and as deemed clinically appropriate. Electronically Signed   By: Garald Balding M.D.   On: 02/12/2015 04:12   Dg Hip Unilat With Pelvis 2-3 Views Right  02/12/2015  CLINICAL DATA:  Status post fall, with  left flank abrasions and right flank pain. Initial encounter. EXAM: DG HIP (WITH OR WITHOUT PELVIS) 2-3V RIGHT COMPARISON:  None. FINDINGS: There is no evidence of fracture or dislocation. Both femoral heads are seated normally within their respective acetabula. The proximal right femur appears intact. Mild sclerotic change is noted at the sacroiliac joints. The sacroiliac joints are unremarkable in appearance. The visualized bowel gas pattern is grossly unremarkable in appearance. Scattered vascular calcifications are seen. IMPRESSION: No evidence of fracture or dislocation. Scattered vascular calcifications seen. Electronically Signed   By: Garald Balding M.D.   On: 02/12/2015 06:38   I have personally reviewed and evaluated these images and lab results as part of my medical decision-making.   EKG Interpretation   Date/Time:  Sunday February 12 2015 03:33:07 EST Ventricular Rate:  79 PR Interval:    QRS Duration: 102 QT Interval:  466 QTC Calculation: 534 R Axis:   -20 Text Interpretation:  Normal sinus rhythm Borderline left axis deviation  Borderline low voltage, extremity leads Minimal ST elevation, inferior  leads Borderline prolonged QT interval No significant change since last  tracing Confirmed by Tylia Ewell  MD-J, Art Levan ([id number]) on 02/12/2015 4:04:42 AM     06 00  Pt was able to walk around the ED.  No dizziness.  Now has some hip soreness.  Would like an xray MDM   Final diagnoses:  Fall  Chest wall contusion, unspecified laterality, initial encounter   Xray do not show acute fx or injuries.  Pt was able to walk around the ED.  Doubt TIA , stroke.  BP has been borderline but pt has not been tachycardic or symptomatic.     At this time there does not appear to be any evidence of an acute emergency medical condition and the patient appears stable for discharge with appropriate outpatient follow up.   At this time there does not appear to be any evidence of an acute emergency medical  condition and the patient appears stable for discharge with appropriate outpatient follow up.    I personally performed the services described in this documentation, which was scribed in my presence.  The recorded information has been reviewed and  is accurate.      Dorie Rank, MD 02/12/15 [phone]

## 2015-02-12 NOTE — ED Notes (Signed)
Provider in room pt was ambulated.

## 2015-02-12 NOTE — ED Notes (Signed)
Brought in by EMS from [facility] with c/o head injury after her fall this morning.  Pt reports that she got out of bed to turn her light off when she got dizzy and fell.  Pt sustained a small hematoma to back of head.  Pt also c/o pain to neck and right flank area.  Pt denies loss of consciousness.  Has some abrasions to left flank area.  Pt reports that she took "Hydrocodone and Xanax" after her fall to relieve her symptoms.   Presents to ED A/Ox4, in no s/s apparent distress noted.

## 2015-02-12 NOTE — ED Notes (Signed)
Pt was able to ambulate with a walker unassisted for about 15 feet.

## 2015-02-12 NOTE — Discharge Instructions (Signed)

## 2015-02-12 NOTE — ED Notes (Signed)
Pt in CT.

## 2015-02-12 NOTE — ED Notes (Signed)
Pt in Xray and Ct

## 2015-02-12 NOTE — ED Notes (Signed)
Bed: WA10 Expected date:  Expected time:  Means of arrival:  Comments: Fall 

## 2015-02-28 DIAGNOSIS — H353132 Nonexudative age-related macular degeneration, bilateral, intermediate dry stage: Secondary | ICD-10-CM | POA: Diagnosis not present

## 2015-04-27 DIAGNOSIS — R399 Unspecified symptoms and signs involving the genitourinary system: Secondary | ICD-10-CM | POA: Diagnosis not present

## 2015-06-06 DIAGNOSIS — R600 Localized edema: Secondary | ICD-10-CM | POA: Diagnosis not present

## 2015-06-06 DIAGNOSIS — M7989 Other specified soft tissue disorders: Secondary | ICD-10-CM | POA: Diagnosis not present

## 2015-06-06 DIAGNOSIS — M79604 Pain in right leg: Secondary | ICD-10-CM | POA: Diagnosis not present

## 2015-06-06 DIAGNOSIS — M25571 Pain in right ankle and joints of right foot: Secondary | ICD-10-CM | POA: Diagnosis not present

## 2015-07-17 DIAGNOSIS — H04123 Dry eye syndrome of bilateral lacrimal glands: Secondary | ICD-10-CM | POA: Diagnosis not present

## 2015-07-17 DIAGNOSIS — H353132 Nonexudative age-related macular degeneration, bilateral, intermediate dry stage: Secondary | ICD-10-CM | POA: Diagnosis not present

## 2015-07-17 DIAGNOSIS — H43813 Vitreous degeneration, bilateral: Secondary | ICD-10-CM | POA: Diagnosis not present

## 2015-07-17 DIAGNOSIS — Z961 Presence of intraocular lens: Secondary | ICD-10-CM | POA: Diagnosis not present

## 2015-10-26 DIAGNOSIS — Z23 Encounter for immunization: Secondary | ICD-10-CM | POA: Diagnosis not present

## 2016-01-17 ENCOUNTER — Emergency Department (HOSPITAL_COMMUNITY)
Admission: EM | Admit: 2016-01-17 | Discharge: 2016-01-17 | Disposition: A | Discharge status: Home / Self Care | Payer: Medicare Other | Attending: Emergency Medicine | Admitting: Emergency Medicine

## 2016-01-17 ENCOUNTER — Ambulatory Visit (INDEPENDENT_AMBULATORY_CARE_PROVIDER_SITE_OTHER): Payer: Medicare Other | Admitting: Family Medicine

## 2016-01-17 ENCOUNTER — Ambulatory Visit (INDEPENDENT_AMBULATORY_CARE_PROVIDER_SITE_OTHER): Payer: Medicare Other

## 2016-01-17 ENCOUNTER — Encounter (HOSPITAL_COMMUNITY): Payer: Self-pay

## 2016-01-17 ENCOUNTER — Emergency Department (HOSPITAL_COMMUNITY): Payer: Medicare Other

## 2016-01-17 VITALS — BP 118/65 | HR 64 | Temp 97.5°F | Resp 16

## 2016-01-17 DIAGNOSIS — S8992XA Unspecified injury of left lower leg, initial encounter: Secondary | ICD-10-CM | POA: Diagnosis not present

## 2016-01-17 DIAGNOSIS — W19XXXA Unspecified fall, initial encounter: Secondary | ICD-10-CM | POA: Insufficient documentation

## 2016-01-17 DIAGNOSIS — S62102A Fracture of unspecified carpal bone, left wrist, initial encounter for closed fracture: Secondary | ICD-10-CM | POA: Diagnosis not present

## 2016-01-17 DIAGNOSIS — M79605 Pain in left leg: Secondary | ICD-10-CM | POA: Diagnosis not present

## 2016-01-17 DIAGNOSIS — S52502A Unspecified fracture of the lower end of left radius, initial encounter for closed fracture: Secondary | ICD-10-CM | POA: Diagnosis not present

## 2016-01-17 DIAGNOSIS — Y939 Activity, unspecified: Secondary | ICD-10-CM | POA: Diagnosis not present

## 2016-01-17 DIAGNOSIS — Y929 Unspecified place or not applicable: Secondary | ICD-10-CM | POA: Diagnosis not present

## 2016-01-17 DIAGNOSIS — S6992XA Unspecified injury of left wrist, hand and finger(s), initial encounter: Secondary | ICD-10-CM | POA: Diagnosis not present

## 2016-01-17 DIAGNOSIS — S199XXA Unspecified injury of neck, initial encounter: Secondary | ICD-10-CM | POA: Diagnosis not present

## 2016-01-17 DIAGNOSIS — N184 Chronic kidney disease, stage 4 (severe): Secondary | ICD-10-CM | POA: Diagnosis not present

## 2016-01-17 DIAGNOSIS — M25532 Pain in left wrist: Secondary | ICD-10-CM | POA: Diagnosis not present

## 2016-01-17 DIAGNOSIS — S52592A Other fractures of lower end of left radius, initial encounter for closed fracture: Secondary | ICD-10-CM

## 2016-01-17 DIAGNOSIS — R930 Abnormal findings on diagnostic imaging of skull and head, not elsewhere classified: Secondary | ICD-10-CM | POA: Diagnosis not present

## 2016-01-17 DIAGNOSIS — Y999 Unspecified external cause status: Secondary | ICD-10-CM | POA: Insufficient documentation

## 2016-01-17 DIAGNOSIS — S6292XA Unspecified fracture of left wrist and hand, initial encounter for closed fracture: Secondary | ICD-10-CM | POA: Insufficient documentation

## 2016-01-17 DIAGNOSIS — T148XXA Other injury of unspecified body region, initial encounter: Secondary | ICD-10-CM | POA: Diagnosis not present

## 2016-01-17 DIAGNOSIS — S0990XA Unspecified injury of head, initial encounter: Secondary | ICD-10-CM | POA: Diagnosis not present

## 2016-01-17 DIAGNOSIS — E039 Hypothyroidism, unspecified: Secondary | ICD-10-CM | POA: Diagnosis not present

## 2016-01-17 DIAGNOSIS — I129 Hypertensive chronic kidney disease with stage 1 through stage 4 chronic kidney disease, or unspecified chronic kidney disease: Secondary | ICD-10-CM | POA: Insufficient documentation

## 2016-01-17 LAB — BASIC METABOLIC PANEL
Anion gap: 9 (ref 5–15)
BUN: 36 mg/dL — AB (ref 6–20)
CHLORIDE: 104 mmol/L (ref 101–111)
CO2: 28 mmol/L (ref 22–32)
CREATININE: 1.47 mg/dL — AB (ref 0.44–1.00)
Calcium: 9.1 mg/dL (ref 8.9–10.3)
GFR calc Af Amer: 33 mL/min — ABNORMAL LOW (ref >60)
GFR calc non Af Amer: 29 mL/min — ABNORMAL LOW (ref >60)
GLUCOSE: 119 mg/dL — AB (ref 65–99)
POTASSIUM: 4.1 mmol/L (ref 3.5–5.1)
Sodium: 141 mmol/L (ref 135–145)

## 2016-01-17 LAB — CBC WITH DIFFERENTIAL/PLATELET
Basophils Absolute: 0 10*3/uL (ref 0.0–0.1)
Basophils Relative: 0 %
EOS PCT: 0 %
Eosinophils Absolute: 0 10*3/uL (ref 0.0–0.7)
HCT: 36.8 % (ref 36.0–46.0)
Hemoglobin: 11.9 g/dL — ABNORMAL LOW (ref 12.0–15.0)
LYMPHS ABS: 0.9 10*3/uL (ref 0.7–4.0)
LYMPHS PCT: 9 %
MCH: 28.1 pg (ref 26.0–34.0)
MCHC: 32.3 g/dL (ref 30.0–36.0)
MCV: 87 fL (ref 78.0–100.0)
MONO ABS: 0.8 10*3/uL (ref 0.1–1.0)
MONOS PCT: 9 %
Neutro Abs: 8.1 10*3/uL — ABNORMAL HIGH (ref 1.7–7.7)
Neutrophils Relative %: 82 %
PLATELETS: 199 10*3/uL (ref 150–400)
RBC: 4.23 MIL/uL (ref 3.87–5.11)
RDW: 14.7 % (ref 11.5–15.5)
WBC: 9.9 10*3/uL (ref 4.0–10.5)

## 2016-01-17 MED ORDER — LIDOCAINE-EPINEPHRINE (PF) 2 %-1:200000 IJ SOLN
20.0000 mL | Freq: Once | INTRAMUSCULAR | Status: AC
  Administered 2016-01-17: 20 mL
  Filled 2016-01-17: qty 20

## 2016-01-17 MED ORDER — MORPHINE SULFATE (PF) 4 MG/ML IV SOLN
4.0000 mg | Freq: Once | INTRAVENOUS | Status: AC
  Administered 2016-01-17: 4 mg via INTRAVENOUS
  Filled 2016-01-17: qty 1

## 2016-01-17 MED ORDER — OXYCODONE-ACETAMINOPHEN 5-325 MG PO TABS
2.0000 | ORAL_TABLET | Freq: Once | ORAL | Status: DC
Start: 2016-01-17 — End: 2016-01-17
  Filled 2016-01-17: qty 2

## 2016-01-17 NOTE — ED Notes (Signed)
ED Provider at bedside. EDP CAMPOS PRESENT WITH ORTHO JON ATTEMPTING TO REDUCE WRIST

## 2016-01-17 NOTE — Progress Notes (Signed)
Applied sugar tong splint

## 2016-01-17 NOTE — ED Notes (Signed)
ED Provider at bedside. EDP CAMPOS PRESENT

## 2016-01-17 NOTE — ED Notes (Signed)
NEW DRIVER WALKER AT BEDSIDE

## 2016-01-17 NOTE — ED Notes (Signed)
EDPA EMILY Provider at bedside.

## 2016-01-17 NOTE — Progress Notes (Signed)
Patient needs a walker to treat with the following condition Distal radial fracture    Comments   Fracture of left wrist Comminuted distal radial fracture:per EDP note 01/17/16

## 2016-01-17 NOTE — ED Provider Notes (Signed)
[facility] DEPT Provider Note   CSN: [id number] Arrival date & time: 01/17/16  1412     History   Chief Complaint Chief Complaint  Patient presents with  . Fall  . Wrist Pain  . wrist deformity    left side    HPI Christina Cabrera is a 83 y.o. female.  HPI   Pt sent in from urgent care with left wrist fracture after fall.  States she was using her walker, taking a tight corner and her walker accidentally flipped over.  She put her left arm out to catch her.  She did hit her head but denies LOC.  Has hematoma on her left shin but denies any significant pain unless the area is pressed.  Has chronic neuropathy in her hands that is unchanged since the fall.  Had URI recently that is resolving.  Denies any other recent illness, fevers.  Denies any other injury.    Pt goes to [facility] for her orthopedic care.  Takes Vicodin 10-325 daily for chronic pain.   Past Medical History:  Diagnosis Date  . Anxiety   . Aortic stenosis    a. Mild AS by echo 03/2013.  Marland Kitchen Arthritis   . Bilateral knee pain 06/10/2012  . Cardiomegaly 12/23/2014  . Cataract   . Chronic kidney disease    Stage III/IV  . Colitis, collagenous 07/11/2011  . Corticosteroid dependence ([place]) 12/17/2014  . DDD (degenerative disc disease), thoracolumbar 12/23/2014  . Debility 12/23/2014  . Diverticulitis   . Diverticulosis of colon without hemorrhage 12/23/2014  . Dysphagia 12/23/2014  . Edema 12/23/2014  . Effusion of left knee 12/23/2014  . Enteritis 12/17/2014  . Hiatal hernia 12/23/2014  . HNP (herniated nucleus pulposus), lumbar 12/23/2014   L 4-5   . Hyperlipidemia   . Hypertension   . Hypokalemia   . Hypothyroidism   . IBS (irritable bowel syndrome) 12/23/2014  . Irritable bowel   . Noninfected skin tear of leg 12/23/2014   Left leg   . Osteoarthritis of left knee 12/23/2014  . Osteoporosis   . PMR (polymyalgia rheumatica) (HCC)    a. On chronic prednisone.  . Polymyalgia rheumatica ([place])  07/11/2011  . Premature atrial contractions    a. Noted during adm 03/2013, corresponding with patient's palpitations.  . Scoliosis (and kyphoscoliosis), idiopathic 12/23/2014  . Spinal stenosis   . Spinal stenosis of lumbar region 12/23/2014  . Symptomatic bradycardia    a. Adm 03/2013 - taken off clonidine and BB.  . T11 vertebral fracture ([place]) 12/23/2014  . Unstable gait 12/23/2014    Patient Active Problem List   Diagnosis Date Noted  . GERD (gastroesophageal reflux disease) 12/28/2014  . Depression with anxiety 12/28/2014  . Debility 12/23/2014  . Osteoarthritis of left knee 12/23/2014  . Effusion of left knee 12/23/2014  . Noninfected skin tear of leg 12/23/2014  . Scoliosis (and kyphoscoliosis), idiopathic 12/23/2014  . T11 vertebral fracture ([place]) 12/23/2014  . Unstable gait 12/23/2014  . Edema 12/23/2014  . Dysphagia 12/23/2014  . Spinal stenosis of lumbar region 12/23/2014  . DDD (degenerative disc disease), thoracolumbar 12/23/2014  . IBS (irritable bowel syndrome) 12/23/2014  . Hiatal hernia 12/23/2014  . Diverticulosis of colon without hemorrhage 12/23/2014  . Cardiomegaly 12/23/2014  . HNP (herniated nucleus pulposus), lumbar 12/23/2014  . Enteritis 12/17/2014  . Sepsis ([place]) 12/17/2014  . Corticosteroid dependence ([place]) 12/17/2014  . Premature atrial contractions   . Aortic stenosis   . Symptomatic bradycardia 04/02/2013  .  Chronic kidney disease, stage III/IV   . Bilateral knee pain 06/10/2012  . Polymyalgia rheumatica ([place]) 07/11/2011  . Colitis, collagenous 07/11/2011  . Hypothyroidism 07/11/2011  . HTN (hypertension) 07/11/2011    Past Surgical History:  Procedure Laterality Date  . ABDOMINAL HYSTERECTOMY  1966  . APPENDECTOMY    . BACK SURGERY    . Breast biopsies    . CHOLECYSTECTOMY    . EYE SURGERY      OB History    No data available       Home Medications    Prior to Admission medications   Medication Sig Start Date End Date Taking?  Authorizing Provider  ALPRAZolam (XANAX) 0.25 MG tablet Take 0.25 mg by mouth 4 (four) times daily.   Yes Historical Provider, MD  amLODipine (NORVASC) 5 MG tablet Take 5 mg by mouth daily.   Yes Historical Provider, MD  Cholecalciferol (VITAMIN D) 2000 UNITS CAPS Take 2,000 Units by mouth daily.   Yes Historical Provider, MD  furosemide (LASIX) 40 MG tablet Take 40 mg by mouth every morning.    Yes Historical Provider, MD  hydrALAZINE (APRESOLINE) 50 MG tablet Take 1 tablet (50 mg total) by mouth 3 (three) times daily. (Every 8 hours) 04/04/13  Yes Dayna N Dunn, PA-C  HYDROcodone-acetaminophen (NORCO) 10-325 MG tablet Take 1 tablet by mouth every 6 (six) hours as needed. pain Patient taking differently: Take 1 tablet by mouth every 4 (four) hours as needed for moderate pain or severe pain. pain 12/21/14  Yes Ripudeep Krystal Eaton, MD  levothyroxine (SYNTHROID, LEVOTHROID) 50 MCG tablet Take 50 mcg by mouth daily.    Yes Historical Provider, MD  Multiple Vitamins-Minerals (PRESERVISION AREDS PO) Take 1 tablet by mouth 2 (two) times daily.   Yes Historical Provider, MD  omeprazole (PRILOSEC) 20 MG capsule Take 20 mg by mouth daily.   Yes Historical Provider, MD  potassium chloride SA (K-DUR,KLOR-CON) 20 MEQ tablet Take 1 tablet (20 mEq total) by mouth 2 (two) times daily. 04/04/13  Yes Dayna N Dunn, PA-C  predniSONE (DELTASONE) 1 MG tablet Take 1 mg by mouth daily with breakfast. Take with prednisone 5mg    Yes Historical Provider, MD  predniSONE (DELTASONE) 5 MG tablet Take 5 mg by mouth every morning. Take with prednisone 1 mg   Yes Historical Provider, MD  sertraline (ZOLOFT) 25 MG tablet Take 25 mg by mouth at bedtime.    Yes Historical Provider, MD  sertraline (ZOLOFT) 50 MG tablet Take 50 mg by mouth at bedtime.   Yes Historical Provider, MD  polyethylene glycol (MIRALAX / GLYCOLAX) packet Take 17 g by mouth daily. For constipation. Change to as needed daily if diaarhea or more than 2 BM's in a  day Patient not taking: Reported on 01/17/2016 12/21/14   Ripudeep Krystal Eaton, MD  saccharomyces boulardii (FLORASTOR) 250 MG capsule Take 1 capsule (250 mg total) by mouth 2 (two) times daily. May change to generic available at the pharmacy Patient not taking: Reported on 01/17/2016 12/21/14   Ripudeep Krystal Eaton, MD    Family History Family History  Problem Relation Age of Onset  . Hyperlipidemia Mother   . Hypertension Mother   . Hypertension Father   . Hyperlipidemia Father   . Hyperlipidemia Sister   . Hypertension Sister   . Diabetes Neg Hx   . Heart attack Neg Hx   . Sudden death Neg Hx     Social History Social History  Substance Use Topics  . Smoking  status: Never Smoker  . Smokeless tobacco: Never Used  . Alcohol use No     Allergies   Codeine; Fosamax [alendronate sodium]; Miacalcin [calcitonin]; Nsaids; Quinolones; and Sulfa antibiotics   Review of Systems Review of Systems  Constitutional: Negative for chills and fever.  HENT: Negative for facial swelling.   Respiratory: Negative for shortness of breath.   Cardiovascular: Negative for chest pain.  Gastrointestinal: Negative for abdominal pain and vomiting.  Musculoskeletal: Positive for arthralgias. Negative for back pain, neck pain and neck stiffness.  Skin: Negative for color change and pallor.  Neurological: Positive for numbness. Negative for syncope, weakness and headaches.  Hematological: Does not bruise/bleed easily.  Psychiatric/Behavioral: Negative for self-injury.     Physical Exam Updated Vital Signs BP 151/64 (BP Location: Right Arm)   Pulse (!) 59   Temp 98.2 F (36.8 C) (Oral)   Resp 13   Ht 5\' 4"  (1.626 m)   Wt 63.5 kg   SpO2 98%   BMI 24.03 kg/m   Physical Exam  Constitutional: She appears well-developed and well-nourished.  HENT:  Head: Normocephalic and atraumatic.  Neck: Neck supple.  Pulmonary/Chest: Effort normal.  Musculoskeletal: She exhibits no deformity.  Left arm in sugar tong  splint.  Moves fingers equally.   Left shin with hematoma, mildly tender to palpation.  No focal bony tenderness.    No other focal tenderness throughout exam.    Neurological: She is alert.  Nursing note and vitals reviewed.    ED Treatments / Results  Labs (all labs ordered are listed, but only abnormal results are displayed) Labs Reviewed  BASIC METABOLIC PANEL - Abnormal; Notable for the following:       Result Value   Glucose, Bld 119 (*)    BUN 36 (*)    Creatinine, Ser 1.47 (*)    GFR calc non Af Amer 29 (*)    GFR calc Af Amer 33 (*)    All other components within normal limits  CBC WITH DIFFERENTIAL/PLATELET - Abnormal; Notable for the following:    Hemoglobin 11.9 (*)    Neutro Abs 8.1 (*)    All other components within normal limits    EKG  EKG Interpretation None       Radiology Dg Wrist Complete Left  Result Date: 01/17/2016 CLINICAL DATA:  Fall.  Swelling. EXAM: LEFT WRIST - COMPLETE 3+ VIEW COMPARISON:  06/08/2012. FINDINGS: Comminuted distal radial fracture with angulation deformity noted. Fracture appears to extend into the radiocarpal joint space. Diffuse osteopenia degenerative change. Degenerative changes particularly prominent about the first carpometacarpal joint . IMPRESSION: Comminuted angulated distal radial fracture with extension to the radiocarpal joint space . Electronically Signed   By: Marcello Moores  Register   On: 01/17/2016 12:32   Dg Tibia/fibula Left  Result Date: 01/17/2016 CLINICAL DATA:  Pain following fall EXAM: LEFT TIBIA AND FIBULA - 2 VIEW COMPARISON:  None. FINDINGS: Frontal and lateral views were obtained. No acute fracture or dislocation. No abnormal periosteal reaction. Bones appear osteoporotic. No soft tissue lesions are appreciable by radiography. Multiple foci of atherosclerotic calcification noted. IMPRESSION: No fracture or dislocation. Bones osteoporotic. No soft tissue lesions seen beyond vascular calcification consistent with  atherosclerosis at multiple sites. Electronically Signed   By: Lowella Grip III M.D.   On: 01/17/2016 16:09   Ct Head Wo Contrast  Result Date: 01/17/2016 CLINICAL DATA:  Patient fell at 9 a.m. after being tingling bed sheets. Hit head against dresser without loss of consciousness. EXAM: CT  HEAD WITHOUT CONTRAST CT CERVICAL SPINE WITHOUT CONTRAST TECHNIQUE: Multidetector CT imaging of the head and cervical spine was performed following the standard protocol without intravenous contrast. Multiplanar CT image reconstructions of the cervical spine were also generated. COMPARISON:  CT from 02/12/2015 FINDINGS: CT HEAD FINDINGS BRAIN: No acute intracranial hemorrhage, mass nor large vascular territory infarctions. There are mild stable patchy periventricular and subcortical white matter hypodensities consistent with chronic small vessel ischemic disease. No effacement of the basal cisterns. Mild chronic ischemic disease of the basal ganglia bilaterally as before. Brainstem and fourth ventricle are within normal limits. Mild superficial atrophy is seen. Head heard a VASCULAR: Moderate calcific atherosclerosis of the carotid siphons. SKULL: No skull fracture. No significant scalp soft tissue swelling. SINUSES/ORBITS: The mastoid air-cells are clear. The included paranasal sinuses are well-aerated.The included ocular globes and orbital contents are non-suspicious. OTHER: None. CT CERVICAL SPINE FINDINGS Alignment: Normal cervical lordosis with slight chronic anterolisthesis of C4 on C5. Skull base and vertebrae: No acute fracture bone destruction. Osteoarthritis of the atlantodental interval with joint space narrowing and sclerosis. Soft tissues and spinal canal: Nonacute. No prevertebral soft tissue swelling. No intraspinal hemorrhage. Disc levels: Cervical spondylosis with disc space narrowing most pronounced from C5 through C7 with small posterior marginal osteophytes. Upper chest: Minimal subpleural fibrosis  and/or atelectasis at the apices. Other: Minimal calcification of the carotid bifurcations bilaterally. IMPRESSION: No acute intracranial nor cervical spinal abnormality. Chronic stable mild volume loss with scattered small vessel ischemic microangiopathy. Chronic mild ischemic change in the bilateral basal ganglia. Mild cervical spondylosis. Chronic minimal anterolisthesis of C4 on C5. Electronically Signed   By: Ashley Royalty M.D.   On: 01/17/2016 15:25   Ct Cervical Spine Wo Contrast  Result Date: 01/17/2016 CLINICAL DATA:  Patient fell at 9 a.m. after being tingling bed sheets. Hit head against dresser without loss of consciousness. EXAM: CT HEAD WITHOUT CONTRAST CT CERVICAL SPINE WITHOUT CONTRAST TECHNIQUE: Multidetector CT imaging of the head and cervical spine was performed following the standard protocol without intravenous contrast. Multiplanar CT image reconstructions of the cervical spine were also generated. COMPARISON:  CT from 02/12/2015 FINDINGS: CT HEAD FINDINGS BRAIN: No acute intracranial hemorrhage, mass nor large vascular territory infarctions. There are mild stable patchy periventricular and subcortical white matter hypodensities consistent with chronic small vessel ischemic disease. No effacement of the basal cisterns. Mild chronic ischemic disease of the basal ganglia bilaterally as before. Brainstem and fourth ventricle are within normal limits. Mild superficial atrophy is seen. Head heard a VASCULAR: Moderate calcific atherosclerosis of the carotid siphons. SKULL: No skull fracture. No significant scalp soft tissue swelling. SINUSES/ORBITS: The mastoid air-cells are clear. The included paranasal sinuses are well-aerated.The included ocular globes and orbital contents are non-suspicious. OTHER: None. CT CERVICAL SPINE FINDINGS Alignment: Normal cervical lordosis with slight chronic anterolisthesis of C4 on C5. Skull base and vertebrae: No acute fracture bone destruction. Osteoarthritis of  the atlantodental interval with joint space narrowing and sclerosis. Soft tissues and spinal canal: Nonacute. No prevertebral soft tissue swelling. No intraspinal hemorrhage. Disc levels: Cervical spondylosis with disc space narrowing most pronounced from C5 through C7 with small posterior marginal osteophytes. Upper chest: Minimal subpleural fibrosis and/or atelectasis at the apices. Other: Minimal calcification of the carotid bifurcations bilaterally. IMPRESSION: No acute intracranial nor cervical spinal abnormality. Chronic stable mild volume loss with scattered small vessel ischemic microangiopathy. Chronic mild ischemic change in the bilateral basal ganglia. Mild cervical spondylosis. Chronic minimal anterolisthesis of C4 on C5. Electronically Signed  By: Ashley Royalty M.D.   On: 01/17/2016 15:25    Procedures Procedures (including critical care time)  Medications Ordered in ED Medications  morphine 4 MG/ML injection 4 mg (4 mg Intravenous Given 01/17/16 1616)  morphine 4 MG/ML injection 4 mg (4 mg Intravenous Given 01/17/16 1653)  morphine 4 MG/ML injection 4 mg (4 mg Intravenous Given 01/17/16 1757)  lidocaine-EPINEPHrine (XYLOCAINE W/EPI) 2 %-1:200000 (PF) injection 20 mL (20 mLs Infiltration Given 01/17/16 1756)     Initial Impression / Assessment and Plan / ED Course  I have reviewed the triage vital signs and the nursing notes.  Pertinent labs & imaging results that were available during my care of the patient were reviewed by me and considered in my medical decision making (see chart for details).  Clinical Course        Pt with mechanical fall with left wrist fracture, sent to ED out of concern because patient ambulates with walker.  Dr Venora Maples received phone call from Dr Lyla Glassing, orthopedics, who has been made aware of the patient from [facility].  Dr Amedeo Plenty has reviewed films and requests pt have sugartong splint (placed at [facility]) platform walker for ambulation  and outpatient follow up with him in the clinic.  Pain controlled in ED.  Splint changed and hematoma block performed by Dr Venora Maples, see his note for further details.  Platform walker provided and teaching performed by orthopedic tech.  Pt has vicodin 10-325 at home, have advised increased frequency of dosing.  I also spoke with staff at [facility] where pt lives and they will have nurse come to evaluate patient tomorrow for higher level of care.  Daughter will take pt home to independent living and take care of her tonight. D/C home.  Discussed result, findings, treatment, and follow up  with patient.  Pt given return precautions.  Pt verbalizes understanding and agrees with plan.      Final Clinical Impressions(s) / ED Diagnoses   Final diagnoses:  Left wrist fracture, closed, initial encounter  Fall, initial encounter    New Prescriptions Discharge Medication List as of 01/17/2016  8:06 PM       Clayton Bibles, PA-C 01/17/16 2058    Clayton Bibles, PA-C 01/17/16 2059    Jola Schmidt, MD 01/18/16 [phone]

## 2016-01-17 NOTE — ED Notes (Signed)
Bed: [id number] Expected date:  Expected time:  Means of arrival:  Comments: EMS-fracture wrist

## 2016-01-17 NOTE — ED Notes (Signed)
Ortho Tech at bedside with this nurse to explain use of specialized walker for arm injuries; pt able to stand with assistance; pt's daughter stated she thinks the walker is a hazard and will not be bringing the walker home with them.

## 2016-01-17 NOTE — Progress Notes (Signed)
Spoke with ED RN, Marzetta Board to check on pt ambulation status Cm informed pt walks with her walker but with difficulty with her walker now related left wrist injury  No home health needs ordered at this time

## 2016-01-17 NOTE — Progress Notes (Signed)
CSW discussed pt's disposition with PA.  PA had spoken with [facility] who confirmed that [place] apartment this [place], but would be evaluated by the facility's RN in the am for LOC needs.

## 2016-01-17 NOTE — ED Notes (Signed)
ORTHO JON EDUCATING PT AND FAMILY WITH (DRIVER WALKER)

## 2016-01-17 NOTE — Discharge Instructions (Signed)
Read the information below.  You may return to the Emergency Department at any time for worsening condition or any new symptoms that concern you.   You may take your home pain medication every 4 hours as needed for pain.    If you develop uncontrolled pain, weakness or numbness of the extremity, severe discoloration of the skin, or you are unable to move your fingers, return to the ER for a recheck.

## 2016-01-17 NOTE — Progress Notes (Signed)
Christina Cabrera is a 83 y.o. female who presents to [facility] today for wrist pain after fall:  1.  Left wrist pain:  Fall this AM at 9 AM.  Patient was walking around her bed and her walker became tangled in her bed sheets. She fell on her left outstretched hand. Had immediate pain in her wrist. She lives in an assisted living facility and is completely walker dependent. They called her daughter who brought her here to be evaluated. She has chronic swelling of her left base of her thumb. However she has new swelling on the dorsum of her wrist. This is a point of maximal tenderness. She also has chronic hemosiderin staining of her bilateral forearms.  After her fall she states that she banged her head against her dresser. She did not lose consciousness. She has no headache currently. No dizziness. She's not on any blood thinners.  ROS as above.  Marland Kitchen   PMH reviewed. Patient is a nonsmoker.   Past Medical History:  Diagnosis Date  . Anxiety   . Aortic stenosis    a. Mild AS by echo 03/2013.  Marland Kitchen Arthritis   . Bilateral knee pain 06/10/2012  . Cardiomegaly 12/23/2014  . Cataract   . Chronic kidney disease    Stage III/IV  . Colitis, collagenous 07/11/2011  . Corticosteroid dependence ([place]) 12/17/2014  . DDD (degenerative disc disease), thoracolumbar 12/23/2014  . Debility 12/23/2014  . Diverticulitis   . Diverticulosis of colon without hemorrhage 12/23/2014  . Dysphagia 12/23/2014  . Edema 12/23/2014  . Effusion of left knee 12/23/2014  . Enteritis 12/17/2014  . Hiatal hernia 12/23/2014  . HNP (herniated nucleus pulposus), lumbar 12/23/2014   L 4-5   . Hyperlipidemia   . Hypertension   . Hypokalemia   . Hypothyroidism   . IBS (irritable bowel syndrome) 12/23/2014  . Irritable bowel   . Noninfected skin tear of leg 12/23/2014   Left leg   . Osteoarthritis of left knee 12/23/2014  . Osteoporosis   . PMR (polymyalgia rheumatica) (H[facility])    a. On chronic prednisone.  .  Polymyalgia rheumatica ([place]) 07/11/2011  . Premature atrial contractions    a. Noted during adm 03/2013, corresponding with patient's palpitations.  . Scoliosis (and kyphoscoliosis), idiopathic 12/23/2014  . Spinal stenosis   . Spinal stenosis of lumbar region 12/23/2014  . Symptomatic bradycardia    a. Adm 03/2013 - taken off clonidine and BB.  . T11 vertebral fracture ([place]) 12/23/2014  . Unstable gait 12/23/2014   Past Surgical History:  Procedure Laterality Date  . ABDOMINAL HYSTERECTOMY  1966  . APPENDECTOMY    . BACK SURGERY    . Breast biopsies    . CHOLECYSTECTOMY    . EYE SURGERY      Medications reviewed. Current Outpatient Prescriptions  Medication Sig Dispense Refill  . ALPRAZolam (XANAX) 0.5 MG tablet Take 0.25 mg by mouth 4 (four) times daily.     Marland Kitchen amLODipine (NORVASC) 5 MG tablet Take 5 mg by mouth daily.    . Cholecalciferol (VITAMIN D) 2000 UNITS CAPS Take 2,000 Units by mouth daily.    . furosemide (LASIX) 40 MG tablet Take 40 mg by mouth every morning.     . hydrALAZINE (APRESOLINE) 50 MG tablet Take 1 tablet (50 mg total) by mouth 3 (three) times daily. (Every 8 hours) 90 tablet 2  . HYDROcodone-acetaminophen (NORCO) 10-325 MG tablet Take 1 tablet by mouth every 6 (six) hours as  needed. pain 20 tablet 0  . levothyroxine (SYNTHROID, LEVOTHROID) 50 MCG tablet Take 50 mcg by mouth daily.     . Multiple Vitamins-Minerals (PRESERVISION AREDS PO) Take 1 tablet by mouth 2 (two) times daily.    Marland Kitchen omeprazole (PRILOSEC) 20 MG capsule Take 20 mg by mouth daily.    . potassium chloride SA (K-DUR,KLOR-CON) 20 MEQ tablet Take 1 tablet (20 mEq total) by mouth 2 (two) times daily. 60 tablet 2  . predniSONE (DELTASONE) 1 MG tablet Take 1 mg by mouth daily with breakfast. Take with prednisone 5mg     . predniSONE (DELTASONE) 5 MG tablet Take 5 mg by mouth every morning. Take with prednisone 1 mg    . sertraline (ZOLOFT) 25 MG tablet Take 25 mg by mouth at bedtime.     . metroNIDAZOLE  (FLAGYL) 500 MG tablet Take 1 tablet (500 mg total) by mouth 3 (three) times daily. X9 more days (Patient not taking: Reported on 01/17/2016) 27 tablet 0  . polyethylene glycol (MIRALAX / GLYCOLAX) packet Take 17 g by mouth daily. For constipation. Change to as needed daily if diaarhea or more than 2 BM's in a day (Patient not taking: Reported on 01/17/2016) 30 each 1  . promethazine (PHENERGAN) 12.5 MG tablet Take 1 tablet (12.5 mg total) by mouth every 6 (six) hours as needed for nausea or vomiting. (Patient not taking: Reported on 01/17/2016) 30 tablet 0  . saccharomyces boulardii (FLORASTOR) 250 MG capsule Take 1 capsule (250 mg total) by mouth 2 (two) times daily. May change to generic available at the pharmacy (Patient not taking: Reported on 01/17/2016) 60 capsule 3   No current facility-administered medications for this visit.      Physical Exam:  BP 118/65 (Cuff Size: Normal)   Pulse 64   Temp 97.5 F (36.4 C) (Oral)   Resp 16   SpO2 98%  Gen:  Alert, cooperative patient who appears stated age in no acute distress.  Vital signs reviewed. HEENT: EOMI,  MMM Pulm:  Clear to auscultation bilaterally with good air movement.  No wheezes or rales noted.   Cardiac:  Regular rate and rhythm without murmur auscultated.  Good S1/S2. MSK:  Right wrist within normal limits.  Left first MTP joint has pretty marketed swelling but there is no tenderness here. She is tender directly over the distal aspect of her radius on the lateral and dorsum areas. Swelling on dorsal radius as well. Skin: Multiple scattered senile purpura of both forearms. Also with hemosiderin changes of both forearms. She does have some bruising along the lateral aspect of her radius. CV:  +2 radial pulses BL wrist Neuro:  Decreased sensation in stocking glove distribution bilateral hands. She can feel me touching her fingers but this is decreased bilaterally. It is not worse right versus left.  Assessment and Plan:  1.  Comminuted  distal radial fracture: -Patient placed in sugar tong splint here. -She is completely walker dependent. She is not able to even get out of bed here degrees the restroom. -We are unable to evaluate for distal neurological changes she has chronic neuropathy. Unclear if there is any median nerve involvement. She has good pulses bilaterally. -She may need surgery for this.  The major issue is that she is definitely going to need rehabilitation as she is unable to ambulate with her fracture being completely walker dependent. We are therefore transfer her to the hospital for further evaluation and likely admission. As noted above she is unable to  transfer because of this fracture. - Transferred to [facility] via EMS.   2.  Fall: - bumped head after fall.  Declined initial transfer for CT -- however, this was before we realized we would need to send her to [facility] for rehab.   - Recommend CT to rule out any bleed.

## 2016-01-17 NOTE — Progress Notes (Addendum)
   01/17/16 0000  CM Assessment  Expected Discharge Plan Home/Self Care  In-house Referral NA  Discharge Planning Services CM Consult  [facility] Choice Durable Medical Equipment  Choice offered to / list presented to  Patient  DME [place]  DME [place]  [facility] NA  Status of Service Completed, signed off  Discharge Disposition Home/Self Care   1535 ED CM spoke with Larene [place] of [facility] to provided referral for Platform walker for pt to assist at d/c for fracture of left wrist

## 2016-01-17 NOTE — ED Triage Notes (Signed)
Per [facility]- Pt fell this am and seen at [facility]gent [facility]. FX to left wrist. Splint intact. Sent here for evaluation. Good CMS. Pulse present. No other complaints

## 2016-01-17 NOTE — Patient Instructions (Addendum)
Sending to ED

## 2016-01-17 NOTE — ED Notes (Signed)
Pt's daughter requested to speak with PA and MD.  PA made aware.

## 2016-01-17 NOTE — ED Notes (Signed)
[facility] AT [place]

## 2016-01-17 NOTE — ED Notes (Signed)
Christina Cabrera

## 2016-01-18 ENCOUNTER — Encounter (HOSPITAL_COMMUNITY): Payer: Self-pay | Admitting: Emergency Medicine

## 2016-01-18 ENCOUNTER — Emergency Department (HOSPITAL_COMMUNITY)
Admission: EM | Admit: 2016-01-18 | Discharge: 2016-01-18 | Disposition: A | Discharge status: Home / Self Care | Payer: Medicare Other | Attending: Emergency Medicine | Admitting: Emergency Medicine

## 2016-01-18 DIAGNOSIS — Y999 Unspecified external cause status: Secondary | ICD-10-CM | POA: Insufficient documentation

## 2016-01-18 DIAGNOSIS — S61512A Laceration without foreign body of left wrist, initial encounter: Secondary | ICD-10-CM | POA: Diagnosis not present

## 2016-01-18 DIAGNOSIS — Y929 Unspecified place or not applicable: Secondary | ICD-10-CM | POA: Diagnosis not present

## 2016-01-18 DIAGNOSIS — S6992XA Unspecified injury of left wrist, hand and finger(s), initial encounter: Secondary | ICD-10-CM | POA: Diagnosis present

## 2016-01-18 DIAGNOSIS — Y939 Activity, unspecified: Secondary | ICD-10-CM | POA: Diagnosis not present

## 2016-01-18 DIAGNOSIS — S52502D Unspecified fracture of the lower end of left radius, subsequent encounter for closed fracture with routine healing: Secondary | ICD-10-CM | POA: Insufficient documentation

## 2016-01-18 DIAGNOSIS — E039 Hypothyroidism, unspecified: Secondary | ICD-10-CM | POA: Diagnosis not present

## 2016-01-18 DIAGNOSIS — I129 Hypertensive chronic kidney disease with stage 1 through stage 4 chronic kidney disease, or unspecified chronic kidney disease: Secondary | ICD-10-CM | POA: Diagnosis not present

## 2016-01-18 DIAGNOSIS — X58XXXA Exposure to other specified factors, initial encounter: Secondary | ICD-10-CM | POA: Insufficient documentation

## 2016-01-18 DIAGNOSIS — S52592D Other fractures of lower end of left radius, subsequent encounter for closed fracture with routine healing: Secondary | ICD-10-CM | POA: Diagnosis not present

## 2016-01-18 DIAGNOSIS — N184 Chronic kidney disease, stage 4 (severe): Secondary | ICD-10-CM | POA: Diagnosis not present

## 2016-01-18 DIAGNOSIS — Z79899 Other long term (current) drug therapy: Secondary | ICD-10-CM | POA: Diagnosis not present

## 2016-01-18 MED ORDER — PREDNISONE 5 MG PO TABS
5.0000 mg | ORAL_TABLET | Freq: Every morning | ORAL | Status: DC
  Administered 2016-01-18: 5 mg via ORAL
  Filled 2016-01-18: qty 1

## 2016-01-18 MED ORDER — OXYCODONE HCL 5 MG PO TABS
5.0000 mg | ORAL_TABLET | Freq: Once | ORAL | Status: AC
  Administered 2016-01-18: 5 mg via ORAL
  Filled 2016-01-18: qty 1

## 2016-01-18 MED ORDER — PREDNISONE 1 MG PO TABS
1.0000 mg | ORAL_TABLET | Freq: Every day | ORAL | Status: DC
Start: 2016-01-19 — End: 2016-01-18

## 2016-01-18 MED ORDER — HYDRALAZINE HCL 50 MG PO TABS
50.0000 mg | ORAL_TABLET | Freq: Three times a day (TID) | ORAL | Status: DC
  Administered 2016-01-18: 50 mg via ORAL
  Filled 2016-01-18 (×2): qty 1

## 2016-01-18 MED ORDER — PANTOPRAZOLE SODIUM 40 MG PO TBEC
40.0000 mg | DELAYED_RELEASE_TABLET | Freq: Every day | ORAL | Status: DC
  Administered 2016-01-18: 40 mg via ORAL
  Filled 2016-01-18: qty 1

## 2016-01-18 MED ORDER — FUROSEMIDE 40 MG PO TABS
40.0000 mg | ORAL_TABLET | Freq: Every morning | ORAL | Status: DC
  Administered 2016-01-18: 40 mg via ORAL
  Filled 2016-01-18: qty 1

## 2016-01-18 MED ORDER — HYDROMORPHONE HCL 1 MG/ML IJ SOLN
0.5000 mg | Freq: Once | INTRAMUSCULAR | Status: AC
  Administered 2016-01-18: 0.5 mg via INTRAVENOUS
  Filled 2016-01-18: qty 1

## 2016-01-18 MED ORDER — OXYCODONE HCL 5 MG PO TABS: 5.0000 mg | ORAL_TABLET | ORAL | 0 refills | Status: DC | PRN

## 2016-01-18 MED ORDER — ONDANSETRON HCL 4 MG/2ML IJ SOLN
4.0000 mg | Freq: Once | INTRAMUSCULAR | Status: AC
  Administered 2016-01-18: 4 mg via INTRAVENOUS
  Filled 2016-01-18: qty 2

## 2016-01-18 MED ORDER — FENTANYL CITRATE (PF) 100 MCG/2ML IJ SOLN
50.0000 ug | INTRAMUSCULAR | Status: DC | PRN
  Administered 2016-01-18: 50 ug via INTRAVENOUS
  Filled 2016-01-18: qty 2

## 2016-01-18 NOTE — ED Provider Notes (Signed)
[facility] DEPT Provider Note   CSN: [id number] Arrival date & time: 01/18/16  0526     History   Chief Complaint Chief Complaint  Patient presents with  . Arm Pain    HPI   Blood pressure 93/61, pulse 76, temperature 98.6 F (37 C), temperature source Oral, resp. rate 20, height 5\' 4"  (1.626 m), weight 63.5 kg, SpO2 98 %.  Christina Cabrera is a 83 y.o. female with extensive past medical history, residing at [facility] complaining of severe pain to left wrist worsening over the course of the last day status post mechanical fall with radius fracture that was reduced and splinted yesterday. She's been taking her Vicodin 10 mg which she takes for chronic pain, was advised to increase the frequency however she did not do this. There has been no recent trauma. Patient cannot use the platform walker, patient and daughter cannot explain why other than that it is unnatural. States that daughter has been staying with her at the assisted living, because she needs to help her with her mobility. On review of systems she endorses dry mouth and cervical pain. Patient is right-hand-dominant, states pain is severe and exacerbated by movement and palpation.  Past Medical History:  Diagnosis Date  . Anxiety   . Aortic stenosis    a. Mild AS by echo 03/2013.  Marland Kitchen Arthritis   . Bilateral knee pain 06/10/2012  . Cardiomegaly 12/23/2014  . Cataract   . Chronic kidney disease    Stage III/IV  . Colitis, collagenous 07/11/2011  . Corticosteroid dependence ([place]) 12/17/2014  . DDD (degenerative disc disease), thoracolumbar 12/23/2014  . Debility 12/23/2014  . Diverticulitis   . Diverticulosis of colon without hemorrhage 12/23/2014  . Dysphagia 12/23/2014  . Edema 12/23/2014  . Effusion of left knee 12/23/2014  . Enteritis 12/17/2014  . Hiatal hernia 12/23/2014  . HNP (herniated nucleus pulposus), lumbar 12/23/2014   L 4-5   . Hyperlipidemia   . Hypertension   . Hypokalemia   .  Hypothyroidism   . IBS (irritable bowel syndrome) 12/23/2014  . Irritable bowel   . Noninfected skin tear of leg 12/23/2014   Left leg   . Osteoarthritis of left knee 12/23/2014  . Osteoporosis   . PMR (polymyalgia rheumatica) (HCC)    a. On chronic prednisone.  . Polymyalgia rheumatica ([place]) 07/11/2011  . Premature atrial contractions    a. Noted during adm 03/2013, corresponding with patient's palpitations.  . Scoliosis (and kyphoscoliosis), idiopathic 12/23/2014  . Spinal stenosis   . Spinal stenosis of lumbar region 12/23/2014  . Symptomatic bradycardia    a. Adm 03/2013 - taken off clonidine and BB.  . T11 vertebral fracture ([place]) 12/23/2014  . Unstable gait 12/23/2014    Patient Active Problem List   Diagnosis Date Noted  . GERD (gastroesophageal reflux disease) 12/28/2014  . Depression with anxiety 12/28/2014  . Debility 12/23/2014  . Osteoarthritis of left knee 12/23/2014  . Effusion of left knee 12/23/2014  . Noninfected skin tear of leg 12/23/2014  . Scoliosis (and kyphoscoliosis), idiopathic 12/23/2014  . T11 vertebral fracture ([place]) 12/23/2014  . Unstable gait 12/23/2014  . Edema 12/23/2014  . Dysphagia 12/23/2014  . Spinal stenosis of lumbar region 12/23/2014  . DDD (degenerative disc disease), thoracolumbar 12/23/2014  . IBS (irritable bowel syndrome) 12/23/2014  . Hiatal hernia 12/23/2014  . Diverticulosis of colon without hemorrhage 12/23/2014  . Cardiomegaly 12/23/2014  . HNP (herniated nucleus pulposus), lumbar 12/23/2014  . Enteritis 12/17/2014  .  Sepsis ([place]) 12/17/2014  . Corticosteroid dependence ([place]) 12/17/2014  . Premature atrial contractions   . Aortic stenosis   . Symptomatic bradycardia 04/02/2013  . Chronic kidney disease, stage III/IV   . Bilateral knee pain 06/10/2012  . Polymyalgia rheumatica ([place]) 07/11/2011  . Colitis, collagenous 07/11/2011  . Hypothyroidism 07/11/2011  . HTN (hypertension) 07/11/2011    Past Surgical History:    Procedure Laterality Date  . ABDOMINAL HYSTERECTOMY  1966  . APPENDECTOMY    . BACK SURGERY    . Breast biopsies    . CHOLECYSTECTOMY    . EYE SURGERY      OB History    No data available       Home Medications    Prior to Admission medications   Medication Sig Start Date End Date Taking? Authorizing Provider  ALPRAZolam (XANAX) 0.25 MG tablet Take 0.25 mg by mouth 4 (four) times daily.    Historical Provider, MD  amLODipine (NORVASC) 5 MG tablet Take 5 mg by mouth daily.    Historical Provider, MD  Cholecalciferol (VITAMIN D) 2000 UNITS CAPS Take 2,000 Units by mouth daily.    Historical Provider, MD  furosemide (LASIX) 40 MG tablet Take 40 mg by mouth every morning.     Historical Provider, MD  hydrALAZINE (APRESOLINE) 50 MG tablet Take 1 tablet (50 mg total) by mouth 3 (three) times daily. (Every 8 hours) 04/04/13   Dayna N Dunn, PA-C  levothyroxine (SYNTHROID, LEVOTHROID) 50 MCG tablet Take 50 mcg by mouth daily.     Historical Provider, MD  Multiple Vitamins-Minerals (PRESERVISION AREDS PO) Take 1 tablet by mouth 2 (two) times daily.    Historical Provider, MD  omeprazole (PRILOSEC) 20 MG capsule Take 20 mg by mouth daily.    Historical Provider, MD  oxyCODONE (ROXICODONE) 5 MG immediate release tablet Take 1 tablet (5 mg total) by mouth every 4 (four) hours as needed. Take 1-2 tablets every 4-6 hours as needed for pain control 01/18/16   Elmyra Ricks Kayelynn Abdou, PA-C  polyethylene glycol (MIRALAX / GLYCOLAX) packet Take 17 g by mouth daily. For constipation. Change to as needed daily if diaarhea or more than 2 BM's in a day Patient not taking: Reported on 01/17/2016 12/21/14   Ripudeep Krystal Eaton, MD  potassium chloride SA (K-DUR,KLOR-CON) 20 MEQ tablet Take 1 tablet (20 mEq total) by mouth 2 (two) times daily. 04/04/13   Dayna N Dunn, PA-C  predniSONE (DELTASONE) 1 MG tablet Take 1 mg by mouth daily with breakfast. Take with prednisone 5mg     Historical Provider, MD  predniSONE (DELTASONE) 5 MG  tablet Take 5 mg by mouth every morning. Take with prednisone 1 mg    Historical Provider, MD  saccharomyces boulardii (FLORASTOR) 250 MG capsule Take 1 capsule (250 mg total) by mouth 2 (two) times daily. May change to generic available at the pharmacy Patient not taking: Reported on 01/17/2016 12/21/14   Ripudeep Krystal Eaton, MD  sertraline (ZOLOFT) 25 MG tablet Take 25 mg by mouth at bedtime.     Historical Provider, MD  sertraline (ZOLOFT) 50 MG tablet Take 50 mg by mouth at bedtime.    Historical Provider, MD    Family History Family History  Problem Relation Age of Onset  . Hyperlipidemia Mother   . Hypertension Mother   . Hypertension Father   . Hyperlipidemia Father   . Hyperlipidemia Sister   . Hypertension Sister   . Diabetes Neg Hx   . Heart attack Neg Hx   .  Sudden death Neg Hx     Social History Social History  Substance Use Topics  . Smoking status: Never Smoker  . Smokeless tobacco: Never Used  . Alcohol use No     Allergies   Codeine; Fosamax [alendronate sodium]; Miacalcin [calcitonin]; Nsaids; Quinolones; and Sulfa antibiotics   Review of Systems Review of Systems  10 systems reviewed and found to be negative, except as noted in the HPI.   Physical Exam Updated Vital Signs BP 129/61   Pulse 85   Temp 98.6 F (37 C) (Oral)   Resp 16   Ht 5\' 4"  (1.626 m)   Wt 63.5 kg   SpO2 94%   BMI 24.03 kg/m   Physical Exam  Constitutional: She is oriented to person, place, and time. She appears well-developed and well-nourished. No distress.  HENT:  Head: Normocephalic and atraumatic.  Mouth/Throat: Oropharynx is clear and moist.  Eyes: Conjunctivae and EOM are normal. Pupils are equal, round, and reactive to light.  Neck: Normal range of motion.  Cardiovascular: Normal rate, regular rhythm and intact distal pulses.   Pulmonary/Chest: Effort normal and breath sounds normal.  Abdominal: Soft. There is no tenderness.  Musculoskeletal: Normal range of motion.    Splint to left arm, no significant swelling, Refill is brisk, mild swelling to fingers. Excellent range of motion but with pain.  Splint is removed and she has ecchymosis to the wrist with a partial-thickness skin tear on the dorsum measuring approximately 3 mm x 6 cm. No warmth or discharge or stone or surrounding cellulitis. Radial pulses 2+, compartments are soft.  Neurological: She is alert and oriented to person, place, and time.  Skin: Capillary refill takes less than 2 seconds. She is not diaphoretic.  Psychiatric: She has a normal mood and affect.  Nursing note and vitals reviewed.    ED Treatments / Results  Labs (all labs ordered are listed, but only abnormal results are displayed) Labs Reviewed - No data to display  EKG  EKG Interpretation None       Radiology Dg Wrist Complete Left  Result Date: 01/17/2016 CLINICAL DATA:  Fall.  Swelling. EXAM: LEFT WRIST - COMPLETE 3+ VIEW COMPARISON:  06/08/2012. FINDINGS: Comminuted distal radial fracture with angulation deformity noted. Fracture appears to extend into the radiocarpal joint space. Diffuse osteopenia degenerative change. Degenerative changes particularly prominent about the first carpometacarpal joint . IMPRESSION: Comminuted angulated distal radial fracture with extension to the radiocarpal joint space . Electronically Signed   By: Marcello Moores  Register   On: 01/17/2016 12:32   Dg Tibia/fibula Left  Result Date: 01/17/2016 CLINICAL DATA:  Pain following fall EXAM: LEFT TIBIA AND FIBULA - 2 VIEW COMPARISON:  None. FINDINGS: Frontal and lateral views were obtained. No acute fracture or dislocation. No abnormal periosteal reaction. Bones appear osteoporotic. No soft tissue lesions are appreciable by radiography. Multiple foci of atherosclerotic calcification noted. IMPRESSION: No fracture or dislocation. Bones osteoporotic. No soft tissue lesions seen beyond vascular calcification consistent with atherosclerosis at multiple sites.  Electronically Signed   By: Lowella Grip III M.D.   On: 01/17/2016 16:09   Ct Head Wo Contrast  Result Date: 01/17/2016 CLINICAL DATA:  Patient fell at 9 a.m. after being tingling bed sheets. Hit head against dresser without loss of consciousness. EXAM: CT HEAD WITHOUT CONTRAST CT CERVICAL SPINE WITHOUT CONTRAST TECHNIQUE: Multidetector CT imaging of the head and cervical spine was performed following the standard protocol without intravenous contrast. Multiplanar CT image reconstructions of the cervical spine  were also generated. COMPARISON:  CT from 02/12/2015 FINDINGS: CT HEAD FINDINGS BRAIN: No acute intracranial hemorrhage, mass nor large vascular territory infarctions. There are mild stable patchy periventricular and subcortical white matter hypodensities consistent with chronic small vessel ischemic disease. No effacement of the basal cisterns. Mild chronic ischemic disease of the basal ganglia bilaterally as before. Brainstem and fourth ventricle are within normal limits. Mild superficial atrophy is seen. Head heard a VASCULAR: Moderate calcific atherosclerosis of the carotid siphons. SKULL: No skull fracture. No significant scalp soft tissue swelling. SINUSES/ORBITS: The mastoid air-cells are clear. The included paranasal sinuses are well-aerated.The included ocular globes and orbital contents are non-suspicious. OTHER: None. CT CERVICAL SPINE FINDINGS Alignment: Normal cervical lordosis with slight chronic anterolisthesis of C4 on C5. Skull base and vertebrae: No acute fracture bone destruction. Osteoarthritis of the atlantodental interval with joint space narrowing and sclerosis. Soft tissues and spinal canal: Nonacute. No prevertebral soft tissue swelling. No intraspinal hemorrhage. Disc levels: Cervical spondylosis with disc space narrowing most pronounced from C5 through C7 with small posterior marginal osteophytes. Upper chest: Minimal subpleural fibrosis and/or atelectasis at the apices.  Other: Minimal calcification of the carotid bifurcations bilaterally. IMPRESSION: No acute intracranial nor cervical spinal abnormality. Chronic stable mild volume loss with scattered small vessel ischemic microangiopathy. Chronic mild ischemic change in the bilateral basal ganglia. Mild cervical spondylosis. Chronic minimal anterolisthesis of C4 on C5. Electronically Signed   By: Ashley Royalty M.D.   On: 01/17/2016 15:25   Ct Cervical Spine Wo Contrast  Result Date: 01/17/2016 CLINICAL DATA:  Patient fell at 9 a.m. after being tingling bed sheets. Hit head against dresser without loss of consciousness. EXAM: CT HEAD WITHOUT CONTRAST CT CERVICAL SPINE WITHOUT CONTRAST TECHNIQUE: Multidetector CT imaging of the head and cervical spine was performed following the standard protocol without intravenous contrast. Multiplanar CT image reconstructions of the cervical spine were also generated. COMPARISON:  CT from 02/12/2015 FINDINGS: CT HEAD FINDINGS BRAIN: No acute intracranial hemorrhage, mass nor large vascular territory infarctions. There are mild stable patchy periventricular and subcortical white matter hypodensities consistent with chronic small vessel ischemic disease. No effacement of the basal cisterns. Mild chronic ischemic disease of the basal ganglia bilaterally as before. Brainstem and fourth ventricle are within normal limits. Mild superficial atrophy is seen. Head heard a VASCULAR: Moderate calcific atherosclerosis of the carotid siphons. SKULL: No skull fracture. No significant scalp soft tissue swelling. SINUSES/ORBITS: The mastoid air-cells are clear. The included paranasal sinuses are well-aerated.The included ocular globes and orbital contents are non-suspicious. OTHER: None. CT CERVICAL SPINE FINDINGS Alignment: Normal cervical lordosis with slight chronic anterolisthesis of C4 on C5. Skull base and vertebrae: No acute fracture bone destruction. Osteoarthritis of the atlantodental interval with  joint space narrowing and sclerosis. Soft tissues and spinal canal: Nonacute. No prevertebral soft tissue swelling. No intraspinal hemorrhage. Disc levels: Cervical spondylosis with disc space narrowing most pronounced from C5 through C7 with small posterior marginal osteophytes. Upper chest: Minimal subpleural fibrosis and/or atelectasis at the apices. Other: Minimal calcification of the carotid bifurcations bilaterally. IMPRESSION: No acute intracranial nor cervical spinal abnormality. Chronic stable mild volume loss with scattered small vessel ischemic microangiopathy. Chronic mild ischemic change in the bilateral basal ganglia. Mild cervical spondylosis. Chronic minimal anterolisthesis of C4 on C5. Electronically Signed   By: Ashley Royalty M.D.   On: 01/17/2016 15:25    Procedures Procedures (including critical care time)  Medications Ordered in ED Medications  furosemide (LASIX) tablet 40 mg (40 mg  Oral Given 01/18/16 1052)  hydrALAZINE (APRESOLINE) tablet 50 mg (50 mg Oral Given 01/18/16 1052)  pantoprazole (PROTONIX) EC tablet 40 mg (40 mg Oral Given 01/18/16 1053)  predniSONE (DELTASONE) tablet 1 mg (not administered)  predniSONE (DELTASONE) tablet 5 mg (5 mg Oral Given 01/18/16 1052)  ondansetron (ZOFRAN) injection 4 mg (4 mg Intravenous Given 01/18/16 [id number])  HYDROmorphone (DILAUDID) injection 0.5 mg (0.5 mg Intravenous Given 01/18/16 0651)  oxyCODONE (Oxy IR/ROXICODONE) immediate release tablet 5 mg (5 mg Oral Given 01/18/16 0853)  oxyCODONE (Oxy IR/ROXICODONE) immediate release tablet 5 mg (5 mg Oral Given 01/18/16 1338)     Initial Impression / Assessment and Plan / ED Course  I have reviewed the triage vital signs and the nursing notes.  Pertinent labs & imaging results that were available during my care of the patient were reviewed by me and considered in my medical decision making (see chart for details).  Clinical Course     Vitals:   01/18/16 0648 01/18/16 0650 01/18/16 0926 01/18/16 1232   BP: 138/61  131/61 129/61  Pulse: 69  65 85  Resp: 18  16 16   Temp:      TempSrc:      SpO2: (S) (!) 85% (S) 95% 97% 94%  Weight:      Height:        Medications  furosemide (LASIX) tablet 40 mg (40 mg Oral Given 01/18/16 1052)  hydrALAZINE (APRESOLINE) tablet 50 mg (50 mg Oral Given 01/18/16 1052)  pantoprazole (PROTONIX) EC tablet 40 mg (40 mg Oral Given 01/18/16 1053)  predniSONE (DELTASONE) tablet 1 mg (not administered)  predniSONE (DELTASONE) tablet 5 mg (5 mg Oral Given 01/18/16 1052)  ondansetron (ZOFRAN) injection 4 mg (4 mg Intravenous Given 01/18/16 [id number])  HYDROmorphone (DILAUDID) injection 0.5 mg (0.5 mg Intravenous Given 01/18/16 0651)  oxyCODONE (Oxy IR/ROXICODONE) immediate release tablet 5 mg (5 mg Oral Given 01/18/16 0853)  oxyCODONE (Oxy IR/ROXICODONE) immediate release tablet 5 mg (5 mg Oral Given 01/18/16 1338)    CORDIE SPEAKMAN is 83 y.o. female presenting with Severe pain to left arm she has a comminuted distal radius fracture that was diagnosed in reduced and splinted yesterday. This does not appear to be a compartment syndrome, she is in a sugar tong splint, don't think the splint is too tight. She takes chronic hydrocodone 10 mg multiple times a day, she was advised to increase the frequency however she did not do this. The dressing and splint were taken down completely and the compartments are soft, neurovascular intact. The splint is reapplied. I think that she cannot tolerate her walker and will need a higher level of care. Social work is consulted. Attending physician has filled out the FL 2, she will be discharged to a higher level of care and will follow with Dr. Amedeo Plenty next week. Patient advised to DC hydrocodone and will write prescription for 5 mg Roxicodone every 4 hours when necessary  This is a shared visit with the attending physician who personally evaluated the patient and agrees with the care plan.   Evaluation does not show pathology that would require ongoing  emergent intervention or inpatient treatment. Pt is hemodynamically stable and mentating appropriately. Discussed findings and plan with patient/guardian, who agrees with care plan. All questions answered. Return precautions discussed and outpatient follow up given.   Final Clinical Impressions(s) / ED Diagnoses   Final diagnoses:  Closed fracture of distal end of left radius with routine healing, unspecified fracture morphology, subsequent encounter  Tear of skin of left wrist, initial encounter    New Prescriptions Discharge Medication List as of 01/18/2016  1:06 PM    START taking these medications   Details  oxyCODONE (ROXICODONE) 5 MG immediate release tablet Take 1 tablet (5 mg total) by mouth every 4 (four) hours as needed. Take 1-2 tablets every 4-6 hours as needed for pain control, Starting Thu 01/18/2016, Print         Monico Blitz, PA-C 01/18/16 [place], PA-C 01/18/16 [place], MD 01/18/16 1652

## 2016-01-18 NOTE — Progress Notes (Signed)
ED CM noted CM consult for Cannot ambulate with platform walker will need higher level of care. Noted SW note and  involved for higher level of care - not Cm intervention

## 2016-01-18 NOTE — Clinical Social Work Note (Signed)
MSW contacted [facility] where patient is an independent living resident and will now require SNF placement.   MSW completed FL-2 and sent updated clinicals to facility via [facility]. Facility prepared for patient's return today, 1/4.  MD will need to cosign FL-2 and provide hard script.   MSW remains available as needed.   Glendon Axe, MSW [phone] 01/18/2016 10:36 AM

## 2016-01-18 NOTE — Clinical Social Work Note (Signed)
FL-2 signed as patient was admitted from [facility] and will return as a skilled care resident. Dtr, Thayer Headings agreeable to care planning. Facility prepared to for patient's return.   Medical Social Worker facilitated patient discharge including contacting patient family and facility to confirm patient discharge plans.  Clinical information faxed to facility and family agreeable with plan. Patient's dtr, Thayer Headings to transport patient back to [facility] and provide AVS and hard scripts. RN to call report prior to discharge to [phone] ext [phone].  Medical Social Worker will sign off for now as social work intervention is no longer needed. Please consult [place] again if new need arises.  Glendon Axe, MSW [phone] 01/18/2016 1:36 PM

## 2016-01-18 NOTE — [place] FL2 (Signed)
[place] MEDICAID FL2 LEVEL OF CARE SCREENING TOOL     IDENTIFICATION  Patient Name: Christina Cabrera Birthdate: December 01, 1917 Sex: female Admission Date (Current Location): 01/18/2016  [facility] and [place] Number:  Herbalist and Address:  [facility],  [place] [address] [place]      Provider Number: [id number]  Attending Physician Name and Address:  Charlesetta Shanks, MD  Relative Name and Phone Number:       Current Level of Care: Hospital Recommended Level of Care: [place] Prior Approval Number:    Date Approved/Denied:   PASRR Number:   [id number]   Discharge Plan: SNF    Current Diagnoses: Patient Active Problem List   Diagnosis Date Noted  . GERD (gastroesophageal reflux disease) 12/28/2014  . Depression with anxiety 12/28/2014  . Debility 12/23/2014  . Osteoarthritis of left knee 12/23/2014  . Effusion of left knee 12/23/2014  . Noninfected skin tear of leg 12/23/2014  . Scoliosis (and kyphoscoliosis), idiopathic 12/23/2014  . T11 vertebral fracture ([place]) 12/23/2014  . Unstable gait 12/23/2014  . Edema 12/23/2014  . Dysphagia 12/23/2014  . Spinal stenosis of lumbar region 12/23/2014  . DDD (degenerative disc disease), thoracolumbar 12/23/2014  . IBS (irritable bowel syndrome) 12/23/2014  . Hiatal hernia 12/23/2014  . Diverticulosis of colon without hemorrhage 12/23/2014  . Cardiomegaly 12/23/2014  . HNP (herniated nucleus pulposus), lumbar 12/23/2014  . Enteritis 12/17/2014  . Sepsis ([place]) 12/17/2014  . Corticosteroid dependence ([place]) 12/17/2014  . Premature atrial contractions   . Aortic stenosis   . Symptomatic bradycardia 04/02/2013  . Chronic kidney disease, stage III/IV   . Bilateral knee pain 06/10/2012  . Polymyalgia rheumatica ([place]) 07/11/2011  . Colitis, collagenous 07/11/2011  . Hypothyroidism 07/11/2011  . HTN (hypertension) 07/11/2011    Orientation RESPIRATION BLADDER Height & Weight      Self, Time, Situation, Place  O2 (at 2L) Continent Weight: 140 lb (63.5 kg) Height:  5\' 4"  (162.6 cm)  BEHAVIORAL SYMPTOMS/MOOD NEUROLOGICAL BOWEL NUTRITION STATUS   (none )  (none ) Continent Diet (Regular )  AMBULATORY STATUS COMMUNICATION OF NEEDS Skin   Extensive Assist Verbally Normal                       Personal Care Assistance Level of Assistance  Dressing, Feeding, Bathing Bathing Assistance: Limited assistance Feeding assistance: Limited assistance Dressing Assistance: Limited assistance     Functional Limitations Info  Speech, Hearing, Sight Sight Info: Adequate Hearing Info: Adequate Speech Info: Adequate    SPECIAL CARE FACTORS FREQUENCY  PT (By licensed PT), OT (By licensed OT)     PT Frequency: 5 OT Frequency: 5            Contractures      Additional Factors Info  Code Status, Allergies  Code Status: DNR  Allergies Info: Codeine, Fosamax Alendronate Sodium, Miacalcin Calcitonin, Nsaids, Quinolones, Sulfa Antibiotics           Current Medications (01/18/2016):  This is the current hospital active medication list Current Facility-Administered Medications  Medication Dose Route Frequency Provider Last Rate Last Dose  . furosemide (LASIX) tablet 40 mg  40 mg Oral q morning - 10a Nicole Pisciotta, PA-C      . hydrALAZINE (APRESOLINE) tablet 50 mg  50 mg Oral TID Elmyra Ricks Pisciotta, PA-C      . pantoprazole (PROTONIX) EC tablet 40 mg  40 mg Oral Daily [employer], PA-C      . [  START ON 01/19/2016] predniSONE (DELTASONE) tablet 1 mg  1 mg Oral Q breakfast Nicole Pisciotta, PA-C      . predniSONE (DELTASONE) tablet 5 mg  5 mg Oral q morning - 10a Monico Blitz, PA-C       Current Outpatient Prescriptions  Medication Sig Dispense Refill  . ALPRAZolam (XANAX) 0.25 MG tablet Take 0.25 mg by mouth 4 (four) times daily.    Marland Kitchen amLODipine (NORVASC) 5 MG tablet Take 5 mg by mouth daily.    . Cholecalciferol (VITAMIN D) 2000 UNITS CAPS Take 2,000 Units  by mouth daily.    . furosemide (LASIX) 40 MG tablet Take 40 mg by mouth every morning.     . hydrALAZINE (APRESOLINE) 50 MG tablet Take 1 tablet (50 mg total) by mouth 3 (three) times daily. (Every 8 hours) 90 tablet 2  . HYDROcodone-acetaminophen (NORCO) 10-325 MG tablet Take 1 tablet by mouth every 6 (six) hours as needed. pain (Patient taking differently: Take 1 tablet by mouth every 4 (four) hours as needed for moderate pain or severe pain. pain) 20 tablet 0  . levothyroxine (SYNTHROID, LEVOTHROID) 50 MCG tablet Take 50 mcg by mouth daily.     . Multiple Vitamins-Minerals (PRESERVISION AREDS PO) Take 1 tablet by mouth 2 (two) times daily.    Marland Kitchen omeprazole (PRILOSEC) 20 MG capsule Take 20 mg by mouth daily.    . polyethylene glycol (MIRALAX / GLYCOLAX) packet Take 17 g by mouth daily. For constipation. Change to as needed daily if diaarhea or more than 2 BM's in a day (Patient not taking: Reported on 01/17/2016) 30 each 1  . potassium chloride SA (K-DUR,KLOR-CON) 20 MEQ tablet Take 1 tablet (20 mEq total) by mouth 2 (two) times daily. 60 tablet 2  . predniSONE (DELTASONE) 1 MG tablet Take 1 mg by mouth daily with breakfast. Take with prednisone 5mg     . predniSONE (DELTASONE) 5 MG tablet Take 5 mg by mouth every morning. Take with prednisone 1 mg    . saccharomyces boulardii (FLORASTOR) 250 MG capsule Take 1 capsule (250 mg total) by mouth 2 (two) times daily. May change to generic available at the pharmacy (Patient not taking: Reported on 01/17/2016) 60 capsule 3  . sertraline (ZOLOFT) 25 MG tablet Take 25 mg by mouth at bedtime.     . sertraline (ZOLOFT) 50 MG tablet Take 50 mg by mouth at bedtime.       Discharge Medications: Please see discharge summary for a list of discharge medications.  Relevant Imaging Results:  Relevant Lab Results:   Additional Information SSN [id number]  Rozell Searing

## 2016-01-18 NOTE — ED Triage Notes (Signed)
Patient got a cast put on today and the arm has swollen. The cast is tight on patients arm. Patient is in excruciating pain. Last took pain medication around 1:30 am.

## 2016-01-18 NOTE — Discharge Instructions (Signed)
Rest and keep the wrist elevated above the level of the heart is much as possible.  Do not take hydrocodone (Vicodin), we are switching him over to oxycodone which is a stronger medication.  Do not hesitate to return to the emergency department for any new, worsening or concerning symptoms.

## 2016-01-19 DIAGNOSIS — S52502A Unspecified fracture of the lower end of left radius, initial encounter for closed fracture: Secondary | ICD-10-CM | POA: Diagnosis not present

## 2016-01-22 ENCOUNTER — Non-Acute Institutional Stay (SKILLED_NURSING_FACILITY): Payer: Medicare Other | Admitting: Internal Medicine

## 2016-01-22 ENCOUNTER — Encounter: Payer: Self-pay | Admitting: Internal Medicine

## 2016-01-22 DIAGNOSIS — M5135 Other intervertebral disc degeneration, thoracolumbar region: Secondary | ICD-10-CM

## 2016-01-22 DIAGNOSIS — I10 Essential (primary) hypertension: Secondary | ICD-10-CM | POA: Diagnosis not present

## 2016-01-22 DIAGNOSIS — S52502A Unspecified fracture of the lower end of left radius, initial encounter for closed fracture: Secondary | ICD-10-CM | POA: Diagnosis not present

## 2016-01-22 DIAGNOSIS — M353 Polymyalgia rheumatica: Secondary | ICD-10-CM | POA: Diagnosis not present

## 2016-01-22 DIAGNOSIS — M25562 Pain in left knee: Secondary | ICD-10-CM | POA: Diagnosis not present

## 2016-01-22 DIAGNOSIS — G8929 Other chronic pain: Secondary | ICD-10-CM | POA: Diagnosis not present

## 2016-01-22 DIAGNOSIS — R2681 Unsteadiness on feet: Secondary | ICD-10-CM

## 2016-01-22 DIAGNOSIS — F418 Other specified anxiety disorders: Secondary | ICD-10-CM

## 2016-01-22 DIAGNOSIS — S52552A Other extraarticular fracture of lower end of left radius, initial encounter for closed fracture: Secondary | ICD-10-CM | POA: Diagnosis not present

## 2016-01-22 DIAGNOSIS — M25532 Pain in left wrist: Secondary | ICD-10-CM | POA: Diagnosis not present

## 2016-01-22 DIAGNOSIS — M25561 Pain in right knee: Secondary | ICD-10-CM

## 2016-01-22 NOTE — Progress Notes (Signed)
History and Physical    Provider:  Jeanmarie Hubert, MD Location:  [place] Room Number: N60A Place of Service:  SNF (31)  PCP: Gennette Pac, MD Patient Care Team: Hulan Fess, MD as PCP - General (Family Medicine) Laurence Spates, MD as Consulting Physician (Gastroenterology) Hennie Duos, MD as Consulting Physician (Rheumatology) Corliss Parish, MD as Consulting Physician (Nephrology) Jettie Booze, MD as Consulting Physician (Cardiology) Glenna Fellows, MD as Attending Physician (Neurosurgery) Susa Day, MD as Consulting Physician (Orthopedic Surgery)  Extended Emergency Contact Information Primary Emergency Contact: [facility] Address: [address]          [place], [place] [address] [name] of [place] Phone: [phone] Mobile Phone: [phone] Relation: Daughter Secondary Emergency Contact: Donnald Garre Address: [place], [place] Phone: [phone] Relation: Son  Code Status: DNR Goals of Care: Advanced Directive information Advanced Directives 01/22/2016  Does Patient Have a Medical Advance Directive? Yes  Type of Advance Directive Out of facility DNR (pink MOST or yellow form)  Does patient want to make changes to medical advance directive? -  Copy of [place] in Chart? -  Would patient like information on creating a medical advance directive? -  Pre-existing out of facility DNR order (yellow form or pink MOST form) Yellow form placed in chart (order not valid for inpatient use)      Chief Complaint  Patient presents with  . New Admit To SNF    following ED visits 01/17/16 and 01/18/16 for broken left wrist following a fall on 1//18.    HPI: Patient is a 83 y.o. female seen today for admission to [facility] SNF on 01/18/16. She had a fall on 01/17/16 and went to the urgent care. She was found to have a fx of the left radius and a head contusion. She  was sent to the ER for further evaluation and treatment. Ct brain showed SVD, but no other significant findings. The radial fx was reduced and she was put in sugar tong splint. She was sent to [facility] SNF for supportive and rehabilitative care. She has severe arthritis and is not able to move well or safely. She needs assistance with all ADL.   She has bone on bone OA of the knees. She complains of pains in the wrists and elbows and shoulders. She has chronic back pains related to DDD in the thoracolumbar area, old fx T11-T12, scoliosis, and spinal stenosis.  There are multiple other medical issues listed below that are stable.   Past Medical History:  Diagnosis Date  . Anxiety   . Aortic stenosis    a. Mild AS by echo 03/2013.  Marland Kitchen Arthritis   . Bilateral knee pain 06/10/2012  . Cardiomegaly 12/23/2014  . Cataract   . Chronic kidney disease    Stage III/IV  . Colitis, collagenous 07/11/2011  . Corticosteroid dependence ([place]) 12/17/2014  . DDD (degenerative disc disease), thoracolumbar 12/23/2014  . Debility 12/23/2014  . Diverticulitis   . Diverticulosis of colon without hemorrhage 12/23/2014  . Dysphagia 12/23/2014  . Edema 12/23/2014  . Effusion of left knee 12/23/2014  . Enteritis 12/17/2014  . Hiatal hernia 12/23/2014  . HNP (herniated nucleus pulposus), lumbar 12/23/2014   L 4-5   . Hyperlipidemia   . Hypertension   . Hypokalemia   . Hypothyroidism   . IBS (irritable bowel syndrome) 12/23/2014  . Irritable bowel   .  Noninfected skin tear of leg 12/23/2014   Left leg   . Osteoarthritis of left knee 12/23/2014  . Osteoporosis   . PMR (polymyalgia rheumatica) (H[facility])    a. On chronic prednisone.  . Polymyalgia rheumatica ([place]) 07/11/2011  . Premature atrial contractions    a. Noted during adm 03/2013, corresponding with patient's palpitations.  . Scoliosis (and kyphoscoliosis), idiopathic 12/23/2014  . Spinal stenosis   . Spinal stenosis of lumbar region 12/23/2014  . Symptomatic bradycardia      a. Adm 03/2013 - taken off clonidine and BB.  . T11 vertebral fracture ([place]) 12/23/2014  . Unstable gait 12/23/2014   Past Surgical History:  Procedure Laterality Date  . ABDOMINAL HYSTERECTOMY  1966  . APPENDECTOMY    . BACK SURGERY    . Breast biopsies    . CHOLECYSTECTOMY    . EYE SURGERY      reports that she has never smoked. She has never used smokeless tobacco. She reports that she does not drink alcohol or use drugs. Social History   Social History  . Marital status: Widowed    Spouse name: N/A  . Number of children: N/A  . Years of education: N/A   Occupational History  . Retired Therapist, sports    Social History Main Topics  . Smoking status: Never Smoker  . Smokeless tobacco: Never Used  . Alcohol use No  . Drug use: No  . Sexual activity: No   Other Topics Concern  . Not on file   Social History Narrative   She is in [place] independent living.  Admitted to [facility] skill unit 01/18/16   Never married   Never smoked          Functional Status Survey:    Family History  Problem Relation Age of Onset  . Hyperlipidemia Mother   . Hypertension Mother   . Hypertension Father   . Hyperlipidemia Father   . Hyperlipidemia Sister   . Hypertension Sister   . Diabetes Neg Hx   . Heart attack Neg Hx   . Sudden death Neg Hx     Health Maintenance  Topic Date Due  . DEXA SCAN  05/07/1984  . PNA vac Low Risk Adult (2 of 2 - PCV13) 03/22/2005  . INFLUENZA VACCINE  08/15/2015  . TETANUS/TDAP  11/20/2021  . ZOSTAVAX  Completed    Allergies  Allergen Reactions  . Codeine Nausea And Vomiting  . Fosamax [Alendronate Sodium]     Unknown.   Dion Saucier [Calcitonin]     Unknown.  . Nsaids     Due to renal function.   . Quinolones Other (See Comments)    platelets  . Sulfa Antibiotics Nausea And Vomiting    Allergies as of 01/22/2016      Reactions   Codeine Nausea And Vomiting   Fosamax [alendronate Sodium]    Unknown.    Miacalcin  [calcitonin]    Unknown.   Nsaids    Due to renal function.    Quinolones Other (See Comments)   platelets   Sulfa Antibiotics Nausea And Vomiting      Medication List       Accurate as of 01/22/16 12:57 PM. Always use your most recent med list.          ALPRAZolam 0.25 MG tablet Commonly known as:  XANAX Take 0.25 mg by mouth 4 (four) times daily.   amLODipine 5 MG tablet Commonly known as:  NORVASC Take 5 mg  by mouth daily.   furosemide 40 MG tablet Commonly known as:  LASIX Take 40 mg by mouth every morning.   hydrALAZINE 50 MG tablet Commonly known as:  APRESOLINE Take 1 tablet (50 mg total) by mouth 3 (three) times daily. (Every 8 hours)   levothyroxine 50 MCG tablet Commonly known as:  SYNTHROID, LEVOTHROID Take 50 mcg by mouth daily.   omeprazole 20 MG capsule Commonly known as:  PRILOSEC Take 20 mg by mouth daily.   oseltamivir 75 MG capsule Commonly known as:  TAMIFLU Take 75 mg by mouth. Take one capsule once daily for 14 days.   oxycodone 5 MG capsule Commonly known as:  OXY-IR Take 5 mg by mouth. Take one tablet every 4 hours as needed for moderate pain. Take two tablets every 4 hours as needed for serve pain.   polyethylene glycol packet Commonly known as:  MIRALAX / GLYCOLAX Take 17 g by mouth daily. For constipation. Change to as needed daily if diaarhea or more than 2 BM's in a day   potassium chloride SA 20 MEQ tablet Commonly known as:  K-DUR,KLOR-CON Take 1 tablet (20 mEq total) by mouth 2 (two) times daily.   predniSONE 1 MG tablet Commonly known as:  DELTASONE Take 1 mg by mouth daily with breakfast. Take with prednisone 5mg    predniSONE 5 MG tablet Commonly known as:  DELTASONE Take 5 mg by mouth every morning. Take with prednisone 1 mg   PRESERVISION AREDS PO Take 1 tablet by mouth 2 (two) times daily.   saccharomyces boulardii 250 MG capsule Commonly known as:  FLORASTOR Take 1 capsule (250 mg total) by mouth 2 (two) times  daily. May change to generic available at the pharmacy   sertraline 50 MG tablet Commonly known as:  ZOLOFT Take 50 mg by mouth at bedtime.   sertraline 25 MG tablet Commonly known as:  ZOLOFT Take 25 mg by mouth at bedtime.   Vitamin D 2000 units Caps Take 2,000 Units by mouth daily.       Review of Systems  Constitutional: Negative for activity change, appetite change, chills, diaphoresis, fatigue, fever and unexpected weight change.  HENT: Negative for congestion, ear discharge, ear pain, hearing loss, postnasal drip, rhinorrhea, sore throat, tinnitus, trouble swallowing and voice change.   Eyes: Positive for visual disturbance (corrective lenses). Negative for pain, redness and itching.  Respiratory: Negative for cough, choking, shortness of breath and wheezing.   Cardiovascular: Positive for leg swelling. Negative for chest pain and palpitations.  Gastrointestinal: Negative for abdominal distention, abdominal pain, constipation, diarrhea and nausea.  Endocrine: Negative for cold intolerance, heat intolerance, polydipsia, polyphagia and polyuria.  Genitourinary: Negative for difficulty urinating, dysuria, flank pain, frequency, hematuria, pelvic pain, urgency and vaginal discharge.  Musculoskeletal: Positive for arthralgias, back pain and joint swelling (lefet hjand and wrist. Both knees). Negative for gait problem, myalgias, neck pain and neck stiffness.       Fx left radius at wrist  Skin: Negative for color change, pallor and rash.       Purple left hand  Allergic/Immunologic: Negative.   Neurological: Positive for weakness. Negative for dizziness, tremors, seizures, syncope, numbness and headaches.  Hematological: Negative for adenopathy. Does not bruise/bleed easily.  Psychiatric/Behavioral: Positive for dysphoric mood. Negative for agitation, behavioral problems, confusion, hallucinations, sleep disturbance and suicidal ideas. The patient is not nervous/anxious and is not  hyperactive.     Vitals:   01/22/16 1220  BP: 120/70  Pulse: 74  Resp:  18  Temp: 98.1 F (36.7 C)  Weight: 140 lb (63.5 kg)  Height: 5\' 4"  (1.626 m)   Body mass index is 24.03 kg/m. Physical Exam  Constitutional: She is oriented to person, place, and time. She appears well-developed and well-nourished. No distress.  Overweight  HENT:  Right Ear: External ear normal.  Left Ear: External ear normal.  Nose: Nose normal.  Mouth/Throat: Oropharynx is clear and moist. No oropharyngeal exudate.  Eyes: Conjunctivae and EOM are normal. Pupils are equal, round, and reactive to light. No scleral icterus.  Neck: No JVD present. No tracheal deviation present. No thyromegaly present.  Cardiovascular: Normal rate, regular rhythm, normal heart sounds and intact distal pulses.  Exam reveals no gallop and no friction rub.   No murmur heard. Pulmonary/Chest: Effort normal. No respiratory distress. She has no wheezes. She has no rales. She exhibits no tenderness.  Abdominal: She exhibits no distension and no mass. There is no tenderness.  Musculoskeletal: Normal range of motion. She exhibits no edema or tenderness.  Generalized weakness. Left knee effusion. Left short arm cast.   Lymphadenopathy:    She has no cervical adenopathy.  Neurological: She is alert and oriented to person, place, and time. No cranial nerve deficit. Coordination normal.  Skin: No rash noted. She is not diaphoretic. No erythema. No pallor.  Purple left hand. Ecchymosis of the lateral left leg.  Psychiatric: Her behavior is normal. Judgment and thought content normal.  Frustrated and depressed about her current situation.    Labs reviewed: Basic Metabolic Panel:  Recent Labs  02/12/15 0345 01/17/16 1440  NA 144 141  K 3.5 4.1  CL 107 104  CO2 24 28  GLUCOSE 100* 119*  BUN 48* 36*  CREATININE 1.71* 1.47*  CALCIUM 9.7 9.1   Liver Function Tests: No results for input(s): AST, ALT, ALKPHOS, BILITOT, PROT,  ALBUMIN in the last 8760 hours. No results for input(s): LIPASE, AMYLASE in the last 8760 hours. No results for input(s): AMMONIA in the last 8760 hours. CBC:  Recent Labs  02/12/15 0345 01/17/16 1440  WBC 10.1 9.9  NEUTROABS 6.0 8.1*  HGB 12.5 11.9*  HCT 38.7 36.8  MCV 87.8 87.0  PLT 171 199   Cardiac Enzymes: No results for input(s): CKTOTAL, CKMB, CKMBINDEX, TROPONINI in the last 8760 hours. BNP: Invalid input(s): POCBNP No results found for: HGBA1C Lab Results  Component Value Date   TSH 4.40 12/29/2014    Assessment/Plan 1. Closed fracture of distal end of left radius, unspecified fracture morphology, initial encounter supportive and rehab care. Pain control. Recently started on oxycodone  2. Polymyalgia rheumatica (HCC) Continue prednisone  3. Essential hypertension controlled  4. Chronic pain of both knees Continue current pain meds  5. Depression with anxiety Continue sertraline  6. DDD (degenerative disc disease), thoracolumbar Continue current pain meds  7. Unstable gait PT  Patient is not able to safely live alone at this time. I hope that as she heals the fracture and gains strength that she will also improve in self care skills. She may need a stay in [place] prior to return to [place].

## 2016-01-23 DIAGNOSIS — S52502A Unspecified fracture of the lower end of left radius, initial encounter for closed fracture: Secondary | ICD-10-CM | POA: Diagnosis not present

## 2016-01-24 DIAGNOSIS — S52502A Unspecified fracture of the lower end of left radius, initial encounter for closed fracture: Secondary | ICD-10-CM | POA: Diagnosis not present

## 2016-01-25 DIAGNOSIS — S52502A Unspecified fracture of the lower end of left radius, initial encounter for closed fracture: Secondary | ICD-10-CM | POA: Diagnosis not present

## 2016-01-26 DIAGNOSIS — S52502A Unspecified fracture of the lower end of left radius, initial encounter for closed fracture: Secondary | ICD-10-CM | POA: Diagnosis not present

## 2016-01-29 DIAGNOSIS — S52502A Unspecified fracture of the lower end of left radius, initial encounter for closed fracture: Secondary | ICD-10-CM | POA: Diagnosis not present

## 2016-01-31 DIAGNOSIS — S52502A Unspecified fracture of the lower end of left radius, initial encounter for closed fracture: Secondary | ICD-10-CM | POA: Diagnosis not present

## 2016-02-01 DIAGNOSIS — S52502A Unspecified fracture of the lower end of left radius, initial encounter for closed fracture: Secondary | ICD-10-CM | POA: Diagnosis not present

## 2016-02-02 DIAGNOSIS — S52502A Unspecified fracture of the lower end of left radius, initial encounter for closed fracture: Secondary | ICD-10-CM | POA: Diagnosis not present

## 2016-02-03 DIAGNOSIS — S52552D Other extraarticular fracture of lower end of left radius, subsequent encounter for closed fracture with routine healing: Secondary | ICD-10-CM | POA: Diagnosis not present

## 2016-02-05 DIAGNOSIS — S52502A Unspecified fracture of the lower end of left radius, initial encounter for closed fracture: Secondary | ICD-10-CM | POA: Diagnosis not present

## 2016-02-06 DIAGNOSIS — S52502A Unspecified fracture of the lower end of left radius, initial encounter for closed fracture: Secondary | ICD-10-CM | POA: Diagnosis not present

## 2016-02-07 DIAGNOSIS — S52502A Unspecified fracture of the lower end of left radius, initial encounter for closed fracture: Secondary | ICD-10-CM | POA: Diagnosis not present

## 2016-02-08 DIAGNOSIS — S52502A Unspecified fracture of the lower end of left radius, initial encounter for closed fracture: Secondary | ICD-10-CM | POA: Diagnosis not present

## 2016-02-12 ENCOUNTER — Non-Acute Institutional Stay (SKILLED_NURSING_FACILITY): Payer: Medicare Other | Admitting: Nurse Practitioner

## 2016-02-12 ENCOUNTER — Encounter: Payer: Self-pay | Admitting: Nurse Practitioner

## 2016-02-12 DIAGNOSIS — M25561 Pain in right knee: Secondary | ICD-10-CM

## 2016-02-12 DIAGNOSIS — R269 Unspecified abnormalities of gait and mobility: Secondary | ICD-10-CM

## 2016-02-12 DIAGNOSIS — K589 Irritable bowel syndrome without diarrhea: Secondary | ICD-10-CM

## 2016-02-12 DIAGNOSIS — N183 Chronic kidney disease, stage 3 unspecified: Secondary | ICD-10-CM

## 2016-02-12 DIAGNOSIS — K449 Diaphragmatic hernia without obstruction or gangrene: Secondary | ICD-10-CM | POA: Diagnosis not present

## 2016-02-12 DIAGNOSIS — E039 Hypothyroidism, unspecified: Secondary | ICD-10-CM | POA: Diagnosis not present

## 2016-02-12 DIAGNOSIS — M353 Polymyalgia rheumatica: Secondary | ICD-10-CM | POA: Diagnosis not present

## 2016-02-12 DIAGNOSIS — K644 Residual hemorrhoidal skin tags: Secondary | ICD-10-CM | POA: Diagnosis not present

## 2016-02-12 DIAGNOSIS — M25562 Pain in left knee: Secondary | ICD-10-CM

## 2016-02-12 DIAGNOSIS — S52502A Unspecified fracture of the lower end of left radius, initial encounter for closed fracture: Secondary | ICD-10-CM | POA: Diagnosis not present

## 2016-02-12 DIAGNOSIS — F418 Other specified anxiety disorders: Secondary | ICD-10-CM | POA: Diagnosis not present

## 2016-02-12 DIAGNOSIS — G8929 Other chronic pain: Secondary | ICD-10-CM

## 2016-02-12 DIAGNOSIS — I10 Essential (primary) hypertension: Secondary | ICD-10-CM | POA: Diagnosis not present

## 2016-02-12 NOTE — Assessment & Plan Note (Signed)
Controlled, continue Amlodipine 50mg, Hydralazi ne 50mg tid, Furosemide 40mg daily. 

## 2016-02-12 NOTE — Assessment & Plan Note (Signed)
Stable, continue MiraLax daily.  

## 2016-02-12 NOTE — Progress Notes (Signed)
Location:  [facility] Room Number: 69 Place of Service:  SNF (31) Provider:  Rashonda Warrior, [place]  NP  Gennette Pac, MD  Patient Care Team: Hulan Fess, MD as PCP - General (Family Medicine) Laurence Spates, MD as Consulting Physician (Gastroenterology) Hennie Duos, MD as Consulting Physician (Rheumatology) Corliss Parish, MD as Consulting Physician (Nephrology) Jettie Booze, MD as Consulting Physician (Cardiology) Glenna Fellows, MD as Attending Physician (Neurosurgery) Susa Day, MD as Consulting Physician (Orthopedic Surgery)  Extended Emergency Contact Information Primary Emergency Contact: [facility] Address: [address]          [place], [place] [address] [name] of [place] Phone: [phone] Mobile Phone: [phone] Relation: Daughter Secondary Emergency Contact: Donnald Garre Address: [place], [place] Phone: [phone] Relation: Son  Code Status:  DNR Goals of care: Advanced Directive information Advanced Directives 02/12/2016  Does Patient Have a Medical Advance Directive? Yes  Type of Advance Directive Out of facility DNR (pink MOST or yellow form)  Does patient want to make changes to medical advance directive? No - Patient declined  Copy of [place] in Chart? -  Would patient like information on creating a medical advance directive? -  Pre-existing out of facility DNR order (yellow form or pink MOST form) Yellow form placed in chart (order not valid for inpatient use)     Chief Complaint  Patient presents with  . Acute Visit    Lg ext hemorroid(inflamed and painful), abd pain,    HPI:  Pt is a 83 y.o. female seen today for an acute visit for external hemorrhoids, unsteady gait.  ED 01/18/16, The radial fx was reduced and she was put in sugar tong splint, then replaced with a short cast, f/u Ortho 02/19/16. [place] 01/17/16. Ct brain showed SVD,    Hx of HTN, controlled, taking Amlodipine, Hydralazine, Furosemide. Mood is managed with Alprazolam, Sertraline. Thyroid, taking Levothyroxine, last TSH 4.40 12/29/14  Past Medical History:  Diagnosis Date  . Anxiety   . Aortic stenosis    a. Mild AS by echo 03/2013.  Marland Kitchen Arthritis   . Bilateral knee pain 06/10/2012  . Cardiomegaly 12/23/2014  . Cataract   . Chronic kidney disease    Stage III/IV  . Colitis, collagenous 07/11/2011  . Corticosteroid dependence ([place]) 12/17/2014  . DDD (degenerative disc disease), thoracolumbar 12/23/2014  . Debility 12/23/2014  . Diverticulitis   . Diverticulosis of colon without hemorrhage 12/23/2014  . Dysphagia 12/23/2014  . Edema 12/23/2014  . Effusion of left knee 12/23/2014  . Enteritis 12/17/2014  . Hiatal hernia 12/23/2014  . HNP (herniated nucleus pulposus), lumbar 12/23/2014   L 4-5   . Hyperlipidemia   . Hypertension   . Hypokalemia   . Hypothyroidism   . IBS (irritable bowel syndrome) 12/23/2014  . Irritable bowel   . Noninfected skin tear of leg 12/23/2014   Left leg   . Osteoarthritis of left knee 12/23/2014  . Osteoporosis   . PMR (polymyalgia rheumatica) (H[facility])    a. On chronic prednisone.  . Polymyalgia rheumatica ([place]) 07/11/2011  . Premature atrial contractions    a. Noted during adm 03/2013, corresponding with patient's palpitations.  . Scoliosis (and kyphoscoliosis), idiopathic 12/23/2014  . Spinal stenosis   . Spinal stenosis of lumbar region 12/23/2014  . Symptomatic bradycardia    a. Adm 03/2013 - taken off clonidine and BB.  . T11 vertebral  fracture ([place]) 12/23/2014  . Unstable gait 12/23/2014   Past Surgical History:  Procedure Laterality Date  . ABDOMINAL HYSTERECTOMY  1966  . APPENDECTOMY    . BACK SURGERY    . Breast biopsies    . CHOLECYSTECTOMY    . EYE SURGERY      Allergies  Allergen Reactions  . Codeine Nausea And Vomiting  . Fosamax [Alendronate Sodium]     Unknown.   Dion Saucier [Calcitonin]     Unknown.  .  Nsaids     Due to renal function.   . Quinolones Other (See Comments)    platelets  . Sulfa Antibiotics Nausea And Vomiting    Allergies as of 02/12/2016      Reactions   Codeine Nausea And Vomiting   Fosamax [alendronate Sodium]    Unknown.    Miacalcin [calcitonin]    Unknown.   Nsaids    Due to renal function.    Quinolones Other (See Comments)   platelets   Sulfa Antibiotics Nausea And Vomiting      Medication List       Accurate as of 02/12/16  3:19 PM. Always use your most recent med list.          ALPRAZolam 0.25 MG tablet Commonly known as:  XANAX Take 0.25 mg by mouth 4 (four) times daily.   amLODipine 5 MG tablet Commonly known as:  NORVASC Take 5 mg by mouth daily.   furosemide 40 MG tablet Commonly known as:  LASIX Take 40 mg by mouth every morning.   hydrALAZINE 50 MG tablet Commonly known as:  APRESOLINE Take 1 tablet (50 mg total) by mouth 3 (three) times daily. (Every 8 hours)   levothyroxine 50 MCG tablet Commonly known as:  SYNTHROID, LEVOTHROID Take 50 mcg by mouth daily.   omeprazole 20 MG capsule Commonly known as:  PRILOSEC Take 20 mg by mouth daily.   oxycodone 5 MG capsule Commonly known as:  OXY-IR Take 5 mg by mouth. Take one tablet every 4 hours as needed for moderate pain. Take two tablets every 4 hours as needed for serve pain.   polyethylene glycol packet Commonly known as:  MIRALAX / GLYCOLAX Take 17 g by mouth daily. For constipation. Change to as needed daily if diaarhea or more than 2 BM's in a day   potassium chloride SA 20 MEQ tablet Commonly known as:  K-DUR,KLOR-CON Take 1 tablet (20 mEq total) by mouth 2 (two) times daily.   predniSONE 1 MG tablet Commonly known as:  DELTASONE Take 1 mg by mouth daily with breakfast. Take with prednisone 5mg    predniSONE 5 MG tablet Commonly known as:  DELTASONE Take 5 mg by mouth every morning. Take with prednisone 1 mg   sertraline 25 MG tablet Commonly known as:   ZOLOFT Take 25 mg by mouth at bedtime.   Vitamin D 2000 units Caps Take 2,000 Units by mouth daily.       Review of Systems  Constitutional: Negative for activity change, appetite change, chills, diaphoresis, fatigue, fever and unexpected weight change.  HENT: Negative for congestion, ear discharge, ear pain, hearing loss, postnasal drip, rhinorrhea, sore throat, tinnitus, trouble swallowing and voice change.   Eyes: Positive for visual disturbance (corrective lenses). Negative for pain, redness and itching.  Respiratory: Negative for cough, choking, shortness of breath and wheezing.   Cardiovascular: Positive for leg swelling. Negative for chest pain and palpitations.  Gastrointestinal: Negative for abdominal distention, abdominal pain, constipation, diarrhea  and nausea.  Endocrine: Negative for cold intolerance, heat intolerance, polydipsia, polyphagia and polyuria.  Genitourinary: Negative for difficulty urinating, dysuria, flank pain, frequency, hematuria, pelvic pain, urgency and vaginal discharge.  Musculoskeletal: Positive for arthralgias, back pain and joint swelling (lefet hjand and wrist. Both knees). Negative for gait problem, myalgias, neck pain and neck stiffness.       Fx left radius at wrist  Skin: Negative for color change, pallor and rash.       Purple left hand  Allergic/Immunologic: Negative.   Neurological: Positive for weakness. Negative for dizziness, tremors, seizures, syncope, numbness and headaches.  Hematological: Negative for adenopathy. Does not bruise/bleed easily.  Psychiatric/Behavioral: Positive for dysphoric mood. Negative for agitation, behavioral problems, confusion, hallucinations, sleep disturbance and suicidal ideas. The patient is not nervous/anxious and is not hyperactive.     Immunization History  Administered Date(s) Administered  . Influenza-Unspecified 11/01/2013  . Pneumococcal Polysaccharide-23 03/22/2004  . Tdap 11/21/2011  . Zoster  06/22/2009   Pertinent  Health Maintenance Due  Topic Date Due  . DEXA SCAN  05/07/1984  . PNA vac Low Risk Adult (2 of 2 - PCV13) 03/22/2005  . INFLUENZA VACCINE  08/15/2015   Fall Risk  01/17/2016  Falls in the past year? Yes  Number falls in past yr: 2 or more   Functional Status Survey:    Vitals:   02/12/16 1212  BP: 118/76  Pulse: 88  Resp: 18  Temp: 98 F (36.7 C)  Weight: 128 lb 4.8 oz (58.2 kg)  Height: 5\' 4"  (1.626 m)   Body mass index is 22.02 kg/m. Physical Exam  Constitutional: She is oriented to person, place, and time. She appears well-developed and well-nourished. No distress.  Overweight  HENT:  Right Ear: External ear normal.  Left Ear: External ear normal.  Nose: Nose normal.  Mouth/Throat: Oropharynx is clear and moist. No oropharyngeal exudate.  Eyes: Conjunctivae and EOM are normal. Pupils are equal, round, and reactive to light. No scleral icterus.  Neck: No JVD present. No tracheal deviation present. No thyromegaly present.  Cardiovascular: Normal rate, regular rhythm and intact distal pulses.  Exam reveals no gallop and no friction rub.   Murmur heard. Systolic murmur [id number] the right sternal border.   Pulmonary/Chest: Effort normal. No respiratory distress. She has no wheezes. She has no rales. She exhibits no tenderness.  Abdominal: She exhibits no distension and no mass. There is no tenderness.  Genitourinary:  Genitourinary Comments: External hemorrhoids.   Musculoskeletal: Normal range of motion. She exhibits tenderness. She exhibits no edema.  Generalized weakness. Left knee effusion. Left short arm cast. Pain left knee>right knee, lower back, left wrist.   Lymphadenopathy:    She has no cervical adenopathy.  Neurological: She is alert and oriented to person, place, and time. No cranial nerve deficit. Coordination normal.  Skin: No rash noted. She is not diaphoretic. No erythema. No pallor.  Purple left hand. Ecchymosis of the lateral  left leg.  Psychiatric: Her behavior is normal. Judgment and thought content normal.  Frustrated and depressed about her current situation.    Labs reviewed:  Recent Labs  01/17/16 1440  NA 141  K 4.1  CL 104  CO2 28  GLUCOSE 119*  BUN 36*  CREATININE 1.47*  CALCIUM 9.1   No results for input(s): AST, ALT, ALKPHOS, BILITOT, PROT, ALBUMIN in the last 8760 hours.  Recent Labs  01/17/16 1440  WBC 9.9  NEUTROABS 8.1*  HGB 11.9*  HCT 36.8  MCV 87.0  PLT 199   Lab Results  Component Value Date   TSH 4.40 12/29/2014   No results found for: HGBA1C No results found for: CHOL, HDL, LDLCALC, LDLDIRECT, TRIG, CHOLHDL  Significant Diagnostic Results in last 30 days:  Dg Wrist Complete Left  Result Date: 01/17/2016 CLINICAL DATA:  Fall.  Swelling. EXAM: LEFT WRIST - COMPLETE 3+ VIEW COMPARISON:  06/08/2012. FINDINGS: Comminuted distal radial fracture with angulation deformity noted. Fracture appears to extend into the radiocarpal joint space. Diffuse osteopenia degenerative change. Degenerative changes particularly prominent about the first carpometacarpal joint . IMPRESSION: Comminuted angulated distal radial fracture with extension to the radiocarpal joint space . Electronically Signed   By: Marcello Moores  Register   On: 01/17/2016 12:32   Dg Tibia/fibula Left  Result Date: 01/17/2016 CLINICAL DATA:  Pain following fall EXAM: LEFT TIBIA AND FIBULA - 2 VIEW COMPARISON:  None. FINDINGS: Frontal and lateral views were obtained. No acute fracture or dislocation. No abnormal periosteal reaction. Bones appear osteoporotic. No soft tissue lesions are appreciable by radiography. Multiple foci of atherosclerotic calcification noted. IMPRESSION: No fracture or dislocation. Bones osteoporotic. No soft tissue lesions seen beyond vascular calcification consistent with atherosclerosis at multiple sites. Electronically Signed   By: Lowella Grip III M.D.   On: 01/17/2016 16:09   Ct Head Wo  Contrast  Result Date: 01/17/2016 CLINICAL DATA:  Patient fell at 9 a.m. after being tingling bed sheets. Hit head against dresser without loss of consciousness. EXAM: CT HEAD WITHOUT CONTRAST CT CERVICAL SPINE WITHOUT CONTRAST TECHNIQUE: Multidetector CT imaging of the head and cervical spine was performed following the standard protocol without intravenous contrast. Multiplanar CT image reconstructions of the cervical spine were also generated. COMPARISON:  CT from 02/12/2015 FINDINGS: CT HEAD FINDINGS BRAIN: No acute intracranial hemorrhage, mass nor large vascular territory infarctions. There are mild stable patchy periventricular and subcortical white matter hypodensities consistent with chronic small vessel ischemic disease. No effacement of the basal cisterns. Mild chronic ischemic disease of the basal ganglia bilaterally as before. Brainstem and fourth ventricle are within normal limits. Mild superficial atrophy is seen. Head heard a VASCULAR: Moderate calcific atherosclerosis of the carotid siphons. SKULL: No skull fracture. No significant scalp soft tissue swelling. SINUSES/ORBITS: The mastoid air-cells are clear. The included paranasal sinuses are well-aerated.The included ocular globes and orbital contents are non-suspicious. OTHER: None. CT CERVICAL SPINE FINDINGS Alignment: Normal cervical lordosis with slight chronic anterolisthesis of C4 on C5. Skull base and vertebrae: No acute fracture bone destruction. Osteoarthritis of the atlantodental interval with joint space narrowing and sclerosis. Soft tissues and spinal canal: Nonacute. No prevertebral soft tissue swelling. No intraspinal hemorrhage. Disc levels: Cervical spondylosis with disc space narrowing most pronounced from C5 through C7 with small posterior marginal osteophytes. Upper chest: Minimal subpleural fibrosis and/or atelectasis at the apices. Other: Minimal calcification of the carotid bifurcations bilaterally. IMPRESSION: No acute  intracranial nor cervical spinal abnormality. Chronic stable mild volume loss with scattered small vessel ischemic microangiopathy. Chronic mild ischemic change in the bilateral basal ganglia. Mild cervical spondylosis. Chronic minimal anterolisthesis of C4 on C5. Electronically Signed   By: Ashley Royalty M.D.   On: 01/17/2016 15:25   Ct Cervical Spine Wo Contrast  Result Date: 01/17/2016 CLINICAL DATA:  Patient fell at 9 a.m. after being tingling bed sheets. Hit head against dresser without loss of consciousness. EXAM: CT HEAD WITHOUT CONTRAST CT CERVICAL SPINE WITHOUT CONTRAST TECHNIQUE: Multidetector CT imaging of the head and cervical spine was performed following  the standard protocol without intravenous contrast. Multiplanar CT image reconstructions of the cervical spine were also generated. COMPARISON:  CT from 02/12/2015 FINDINGS: CT HEAD FINDINGS BRAIN: No acute intracranial hemorrhage, mass nor large vascular territory infarctions. There are mild stable patchy periventricular and subcortical white matter hypodensities consistent with chronic small vessel ischemic disease. No effacement of the basal cisterns. Mild chronic ischemic disease of the basal ganglia bilaterally as before. Brainstem and fourth ventricle are within normal limits. Mild superficial atrophy is seen. Head heard a VASCULAR: Moderate calcific atherosclerosis of the carotid siphons. SKULL: No skull fracture. No significant scalp soft tissue swelling. SINUSES/ORBITS: The mastoid air-cells are clear. The included paranasal sinuses are well-aerated.The included ocular globes and orbital contents are non-suspicious. OTHER: None. CT CERVICAL SPINE FINDINGS Alignment: Normal cervical lordosis with slight chronic anterolisthesis of C4 on C5. Skull base and vertebrae: No acute fracture bone destruction. Osteoarthritis of the atlantodental interval with joint space narrowing and sclerosis. Soft tissues and spinal canal: Nonacute. No prevertebral  soft tissue swelling. No intraspinal hemorrhage. Disc levels: Cervical spondylosis with disc space narrowing most pronounced from C5 through C7 with small posterior marginal osteophytes. Upper chest: Minimal subpleural fibrosis and/or atelectasis at the apices. Other: Minimal calcification of the carotid bifurcations bilaterally. IMPRESSION: No acute intracranial nor cervical spinal abnormality. Chronic stable mild volume loss with scattered small vessel ischemic microangiopathy. Chronic mild ischemic change in the bilateral basal ganglia. Mild cervical spondylosis. Chronic minimal anterolisthesis of C4 on C5. Electronically Signed   By: Ashley Royalty M.D.   On: 01/17/2016 15:25    Assessment/Plan External hemorrhoids Apply 1% hydrocortisone cream to external hemorrhoids and inflamed anal area daily, avoid constipation.   Gait abnormality Lots of DJD, DDD, PT to eval and tx for gait, strengthening, mobility.   HTN (hypertension) Controlled, continue Amlodipine 50mg , Hydralazi ne 50mg  tid, Furosemide 40mg  daily.   Hiatal hernia Stable, continue Omeprazole 20mg  daily.   IBS (irritable bowel syndrome) Stable, continue MiraLax daily.   Hypothyroidism Continue Levothyroxine 12mcg, update TSH, CBC, CMP  Fracture of distal end of left radius Continue splint, Ortho, PT to eval and tx  Chronic kidney disease, stage III/IV Update CMP, last Bun 36, creat 1.47 01/17/16  Polymyalgia rheumatica Continue Prednisone  Depression with anxiety Stable, continue Alprazolam.   Bilateral knee pain Pain wore in the left knee then the right, pain is also in lower beck, left wrist. Desires switch from Oxycodone to hydrocodone/acetaminophin 10/325mg  q6h po.      Family/ staff Communication:    Labs/tests ordered:  CBC CMP TSH

## 2016-02-12 NOTE — Assessment & Plan Note (Signed)
Lots of DJD, DDD, PT to eval and tx for gait, strengthening, mobility.

## 2016-02-12 NOTE — Assessment & Plan Note (Signed)
Continue Levothyroxine 33mcg, update TSH, CBC, CMP

## 2016-02-12 NOTE — Assessment & Plan Note (Signed)
Continue splint, Ortho, PT to eval and tx

## 2016-02-12 NOTE — Assessment & Plan Note (Signed)
Update CMP, last Bun 36, creat 1.47 01/17/16

## 2016-02-12 NOTE — Assessment & Plan Note (Signed)
Pain wore in the left knee then the right, pain is also in lower beck, left wrist. Desires switch from Oxycodone to hydrocodone/acetaminophin 10/325mg  q6h po.

## 2016-02-12 NOTE — Assessment & Plan Note (Signed)
Stable, continue Alprazolam.  

## 2016-02-12 NOTE — Assessment & Plan Note (Signed)
Continue Prednisone

## 2016-02-12 NOTE — Assessment & Plan Note (Signed)
Stable, continue Omeprazole 20mg daily.  

## 2016-02-12 NOTE — Assessment & Plan Note (Signed)
Apply 1% hydrocortisone cream to external hemorrhoids and inflamed anal area daily, avoid constipation.

## 2016-02-13 DIAGNOSIS — I10 Essential (primary) hypertension: Secondary | ICD-10-CM | POA: Diagnosis not present

## 2016-02-13 DIAGNOSIS — R2681 Unsteadiness on feet: Secondary | ICD-10-CM | POA: Diagnosis not present

## 2016-02-13 DIAGNOSIS — S52502A Unspecified fracture of the lower end of left radius, initial encounter for closed fracture: Secondary | ICD-10-CM | POA: Diagnosis not present

## 2016-02-13 DIAGNOSIS — G8929 Other chronic pain: Secondary | ICD-10-CM | POA: Diagnosis not present

## 2016-02-13 LAB — BASIC METABOLIC PANEL
BUN: 33 mg/dL — AB (ref 4–21)
Creatinine: 1.4 mg/dL — AB (ref <=1.1)
Glucose: 78 mg/dL
POTASSIUM: 3.7 mmol/L (ref 3.4–5.3)
SODIUM: 139 mmol/L (ref 137–147)

## 2016-02-13 LAB — CBC AND DIFFERENTIAL
HCT: 35 % — AB (ref 36–46)
HEMOGLOBIN: 11.4 g/dL — AB (ref 12.0–16.0)
Platelets: 144 10*3/uL — AB (ref 150–399)
WBC: 7.2 10^3/mL

## 2016-02-13 LAB — TSH: TSH: 3.53 u[IU]/mL (ref <=5.90)

## 2016-02-13 LAB — HEPATIC FUNCTION PANEL
ALT: 9 U/L (ref 7–35)
AST: 14 U/L (ref 13–35)
Alkaline Phosphatase: 75 U/L (ref 25–125)
Bilirubin, Total: 0.5 mg/dL

## 2016-02-14 DIAGNOSIS — S52502A Unspecified fracture of the lower end of left radius, initial encounter for closed fracture: Secondary | ICD-10-CM | POA: Diagnosis not present

## 2016-02-15 DIAGNOSIS — R29898 Other symptoms and signs involving the musculoskeletal system: Secondary | ICD-10-CM | POA: Diagnosis not present

## 2016-02-15 DIAGNOSIS — M25561 Pain in right knee: Secondary | ICD-10-CM | POA: Diagnosis not present

## 2016-02-15 DIAGNOSIS — S52514S Nondisplaced fracture of right radial styloid process, sequela: Secondary | ICD-10-CM | POA: Diagnosis not present

## 2016-02-15 DIAGNOSIS — M6281 Muscle weakness (generalized): Secondary | ICD-10-CM | POA: Diagnosis not present

## 2016-02-15 DIAGNOSIS — R1312 Dysphagia, oropharyngeal phase: Secondary | ICD-10-CM | POA: Diagnosis not present

## 2016-02-19 ENCOUNTER — Other Ambulatory Visit: Payer: Self-pay | Admitting: *Deleted

## 2016-02-19 DIAGNOSIS — R1312 Dysphagia, oropharyngeal phase: Secondary | ICD-10-CM | POA: Diagnosis not present

## 2016-02-19 DIAGNOSIS — M6281 Muscle weakness (generalized): Secondary | ICD-10-CM | POA: Diagnosis not present

## 2016-02-19 DIAGNOSIS — S52514S Nondisplaced fracture of right radial styloid process, sequela: Secondary | ICD-10-CM | POA: Diagnosis not present

## 2016-02-19 DIAGNOSIS — R29898 Other symptoms and signs involving the musculoskeletal system: Secondary | ICD-10-CM | POA: Diagnosis not present

## 2016-02-19 DIAGNOSIS — M25561 Pain in right knee: Secondary | ICD-10-CM | POA: Diagnosis not present

## 2016-02-20 DIAGNOSIS — M25561 Pain in right knee: Secondary | ICD-10-CM | POA: Diagnosis not present

## 2016-02-20 DIAGNOSIS — M6281 Muscle weakness (generalized): Secondary | ICD-10-CM | POA: Diagnosis not present

## 2016-02-20 DIAGNOSIS — R29898 Other symptoms and signs involving the musculoskeletal system: Secondary | ICD-10-CM | POA: Diagnosis not present

## 2016-02-20 DIAGNOSIS — R1312 Dysphagia, oropharyngeal phase: Secondary | ICD-10-CM | POA: Diagnosis not present

## 2016-02-20 DIAGNOSIS — S52514S Nondisplaced fracture of right radial styloid process, sequela: Secondary | ICD-10-CM | POA: Diagnosis not present

## 2016-02-21 DIAGNOSIS — R1312 Dysphagia, oropharyngeal phase: Secondary | ICD-10-CM | POA: Diagnosis not present

## 2016-02-21 DIAGNOSIS — M25561 Pain in right knee: Secondary | ICD-10-CM | POA: Diagnosis not present

## 2016-02-21 DIAGNOSIS — M6281 Muscle weakness (generalized): Secondary | ICD-10-CM | POA: Diagnosis not present

## 2016-02-21 DIAGNOSIS — R29898 Other symptoms and signs involving the musculoskeletal system: Secondary | ICD-10-CM | POA: Diagnosis not present

## 2016-02-21 DIAGNOSIS — S52514S Nondisplaced fracture of right radial styloid process, sequela: Secondary | ICD-10-CM | POA: Diagnosis not present

## 2016-02-21 DIAGNOSIS — S52552D Other extraarticular fracture of lower end of left radius, subsequent encounter for closed fracture with routine healing: Secondary | ICD-10-CM | POA: Diagnosis not present

## 2016-02-21 DIAGNOSIS — M25532 Pain in left wrist: Secondary | ICD-10-CM | POA: Diagnosis not present

## 2016-02-29 ENCOUNTER — Encounter: Payer: Self-pay | Admitting: Nurse Practitioner

## 2016-02-29 ENCOUNTER — Non-Acute Institutional Stay (SKILLED_NURSING_FACILITY): Payer: Medicare Other | Admitting: Nurse Practitioner

## 2016-02-29 DIAGNOSIS — K589 Irritable bowel syndrome without diarrhea: Secondary | ICD-10-CM

## 2016-02-29 DIAGNOSIS — M353 Polymyalgia rheumatica: Secondary | ICD-10-CM | POA: Diagnosis not present

## 2016-02-29 DIAGNOSIS — R609 Edema, unspecified: Secondary | ICD-10-CM | POA: Diagnosis not present

## 2016-02-29 DIAGNOSIS — K449 Diaphragmatic hernia without obstruction or gangrene: Secondary | ICD-10-CM | POA: Diagnosis not present

## 2016-02-29 DIAGNOSIS — N183 Chronic kidney disease, stage 3 unspecified: Secondary | ICD-10-CM

## 2016-02-29 DIAGNOSIS — F418 Other specified anxiety disorders: Secondary | ICD-10-CM

## 2016-02-29 DIAGNOSIS — I10 Essential (primary) hypertension: Secondary | ICD-10-CM

## 2016-02-29 DIAGNOSIS — S8011XA Contusion of right lower leg, initial encounter: Secondary | ICD-10-CM | POA: Diagnosis not present

## 2016-02-29 DIAGNOSIS — E039 Hypothyroidism, unspecified: Secondary | ICD-10-CM

## 2016-02-29 NOTE — Assessment & Plan Note (Signed)
02/13/16 Bun 33, creat 1.4

## 2016-02-29 NOTE — Assessment & Plan Note (Signed)
Continue Prednisone

## 2016-02-29 NOTE — Assessment & Plan Note (Addendum)
Controlled, continue Amlodipine 50mg, Hydralazi ne 50mg tid, Furosemide 40mg daily. 02/13/16 wbc 7.2, Hgb 11.4, plt 144, Na 139, K 3.7, bun 33, creat 1.4, TSH 3.53  

## 2016-02-29 NOTE — Assessment & Plan Note (Signed)
Stable, continue Alprazolam 0.25mg  tid(pharm recommendation from qid)

## 2016-02-29 NOTE — Progress Notes (Signed)
Location:  [place] Room Number: 53 Place of Service:  SNF (31) Provider:  Jolina Symonds, [place]  NP  Jeanmarie Hubert, MD  Patient Care Team: Estill Dooms, MD as PCP - General (Internal Medicine) Laurence Spates, MD as Consulting Physician (Gastroenterology) Hennie Duos, MD as Consulting Physician (Rheumatology) Corliss Parish, MD as Consulting Physician (Nephrology) Jettie Booze, MD as Consulting Physician (Cardiology) Glenna Fellows, MD as Attending Physician (Neurosurgery) Susa Day, MD as Consulting Physician (Orthopedic Surgery) Kushal Saunders Otho Darner, NP as Nurse Practitioner (Internal Medicine)  Extended Emergency Contact Information Primary Emergency Contact: [facility] Address: [address]          [place], [place] [address] [name] of [place] Phone: [phone] Mobile Phone: [phone] Relation: Daughter Secondary Emergency Contact: Donnald Garre Address: [place], [place] Phone: [phone] Relation: Son  Code Status:  DNR Goals of care: Advanced Directive information Advanced Directives 02/29/2016  Does Patient Have a Medical Advance Directive? Yes  Type of Advance Directive Out of facility DNR (pink MOST or yellow form)  Does patient want to make changes to medical advance directive? No - Patient declined  Copy of [place] in Chart? -  Would patient like information on creating a medical advance directive? -  Pre-existing out of facility DNR order (yellow form or pink MOST form) Yellow form placed in chart (order not valid for inpatient use)     Chief Complaint  Patient presents with  . Acute Visit    (RT) leg hematoma,    HPI:  Pt is a 83 y.o. female seen today for an acute visit for right lower leg hematoma, no s/s of infection, it should heal.     ED 01/18/16, The radial fx was reduced and she was put in sugar tong splint, then replaced with a short  cast, f/u Ortho 02/19/16. [place] 01/17/16. Ct brain showed SVD,              Hx of HTN, controlled, taking Amlodipine, Hydralazine, Furosemide. Mood is managed with Alprazolam, Sertraline. Thyroid, taking Levothyroxine, last TSH 4.40 12/29/14, 3.53 02/13/16   Past Medical History:  Diagnosis Date  . Anxiety   . Aortic stenosis    a. Mild AS by echo 03/2013.  Marland Kitchen Arthritis   . Bilateral knee pain 06/10/2012  . Cardiomegaly 12/23/2014  . Cataract   . Chronic kidney disease    Stage III/IV  . Colitis, collagenous 07/11/2011  . Corticosteroid dependence ([place]) 12/17/2014  . DDD (degenerative disc disease), thoracolumbar 12/23/2014  . Debility 12/23/2014  . Diverticulitis   . Diverticulosis of colon without hemorrhage 12/23/2014  . Dysphagia 12/23/2014  . Edema 12/23/2014  . Effusion of left knee 12/23/2014  . Enteritis 12/17/2014  . Hiatal hernia 12/23/2014  . HNP (herniated nucleus pulposus), lumbar 12/23/2014   L 4-5   . Hyperlipidemia   . Hypertension   . Hypokalemia   . Hypothyroidism   . IBS (irritable bowel syndrome) 12/23/2014  . Irritable bowel   . Noninfected skin tear of leg 12/23/2014   Left leg   . Osteoarthritis of left knee 12/23/2014  . Osteoporosis   . PMR (polymyalgia rheumatica) (HCC)    a. On chronic prednisone.  . Polymyalgia rheumatica ([place]) 07/11/2011  . Premature atrial contractions    a. Noted during adm 03/2013, corresponding with patient's palpitations.  . Scoliosis (and kyphoscoliosis), idiopathic 12/23/2014  . Spinal  stenosis   . Spinal stenosis of lumbar region 12/23/2014  . Symptomatic bradycardia    a. Adm 03/2013 - taken off clonidine and BB.  . T11 vertebral fracture ([place]) 12/23/2014  . Unstable gait 12/23/2014   Past Surgical History:  Procedure Laterality Date  . ABDOMINAL HYSTERECTOMY  1966  . APPENDECTOMY    . BACK SURGERY    . Breast biopsies    . CHOLECYSTECTOMY    . EYE SURGERY      Allergies  Allergen Reactions  . Codeine Nausea And Vomiting  .  Fosamax [Alendronate Sodium]     Unknown.   Dion Saucier [Calcitonin]     Unknown.  . Nsaids     Due to renal function.   . Quinolones Other (See Comments)    platelets  . Sulfa Antibiotics Nausea And Vomiting    Allergies as of 02/29/2016      Reactions   Codeine Nausea And Vomiting   Fosamax [alendronate Sodium]    Unknown.    Miacalcin [calcitonin]    Unknown.   Nsaids    Due to renal function.    Quinolones Other (See Comments)   platelets   Sulfa Antibiotics Nausea And Vomiting      Medication List       Accurate as of 02/29/16  4:55 PM. Always use your most recent med list.          ALPRAZolam 0.25 MG tablet Commonly known as:  XANAX Take 0.25 mg by mouth 4 (four) times daily.   amLODipine 5 MG tablet Commonly known as:  NORVASC Take 5 mg by mouth daily.   furosemide 40 MG tablet Commonly known as:  LASIX Take 40 mg by mouth every morning.   hydrALAZINE 50 MG tablet Commonly known as:  APRESOLINE Take 1 tablet (50 mg total) by mouth 3 (three) times daily. (Every 8 hours)   HYDROcodone-acetaminophen 10-325 MG tablet Commonly known as:  NORCO Take 1 tablet by mouth every 6 (six) hours as needed.   levothyroxine 50 MCG tablet Commonly known as:  SYNTHROID, LEVOTHROID Take 50 mcg by mouth daily.   omeprazole 20 MG capsule Commonly known as:  PRILOSEC Take 20 mg by mouth daily.   polyethylene glycol packet Commonly known as:  MIRALAX / GLYCOLAX Take 17 g by mouth daily. For constipation. Change to as needed daily if diaarhea or more than 2 BM's in a day   potassium chloride SA 20 MEQ tablet Commonly known as:  K-DUR,KLOR-CON Take 1 tablet (20 mEq total) by mouth 2 (two) times daily.   predniSONE 1 MG tablet Commonly known as:  DELTASONE Take 1 mg by mouth daily with breakfast. Take with prednisone 5mg    predniSONE 5 MG tablet Commonly known as:  DELTASONE Take 5 mg by mouth every morning. Take with prednisone 1 mg   sertraline 50 MG  tablet Commonly known as:  ZOLOFT Take 50 mg by mouth daily. Take with 25 mg   sertraline 25 MG tablet Commonly known as:  ZOLOFT Take 25 mg by mouth at bedtime.   Vitamin D 2000 units Caps Take 2,000 Units by mouth daily.       Review of Systems  Constitutional: Negative for activity change, appetite change, chills, diaphoresis, fatigue, fever and unexpected weight change.  HENT: Negative for congestion, ear discharge, ear pain, hearing loss, postnasal drip, rhinorrhea, sore throat, tinnitus, trouble swallowing and voice change.   Eyes: Positive for visual disturbance (corrective lenses). Negative for pain, redness and  itching.  Respiratory: Negative for cough, choking, shortness of breath and wheezing.   Cardiovascular: Positive for leg swelling. Negative for chest pain and palpitations.  Gastrointestinal: Negative for abdominal distention, abdominal pain, constipation, diarrhea and nausea.  Endocrine: Negative for cold intolerance, heat intolerance, polydipsia, polyphagia and polyuria.  Genitourinary: Negative for difficulty urinating, dysuria, flank pain, frequency, hematuria, pelvic pain, urgency and vaginal discharge.  Musculoskeletal: Positive for arthralgias, back pain and joint swelling (lefet hjand and wrist. Both knees). Negative for gait problem, myalgias, neck pain and neck stiffness.       Fx left radius at wrist  Skin: Negative for color change, pallor and rash.       Purple left hand RLE hematoma.   Allergic/Immunologic: Negative.   Neurological: Positive for weakness. Negative for dizziness, tremors, seizures, syncope, numbness and headaches.  Hematological: Negative for adenopathy. Does not bruise/bleed easily.  Psychiatric/Behavioral: Positive for dysphoric mood. Negative for agitation, behavioral problems, confusion, hallucinations, sleep disturbance and suicidal ideas. The patient is not nervous/anxious and is not hyperactive.     Immunization History   Administered Date(s) Administered  . Influenza-Unspecified 11/01/2013  . Pneumococcal Polysaccharide-23 03/22/2004  . Tdap 11/21/2011  . Zoster 06/22/2009   Pertinent  Health Maintenance Due  Topic Date Due  . DEXA SCAN  05/07/1984  . PNA vac Low Risk Adult (2 of 2 - PCV13) 03/22/2005  . INFLUENZA VACCINE  08/15/2015   Fall Risk  01/17/2016  Falls in the past year? Yes  Number falls in past yr: 2 or more   Functional Status Survey:    Vitals:   02/29/16 1523  BP: 122/70  Pulse: 66  Resp: 18  Temp: 98.6 F (37 C)  Weight: 130 lb (59 kg)  Height: 5\' 4"  (1.626 m)   Body mass index is 22.31 kg/m. Physical Exam  Constitutional: She is oriented to person, place, and time. She appears well-developed and well-nourished. No distress.  Overweight  HENT:  Right Ear: External ear normal.  Left Ear: External ear normal.  Nose: Nose normal.  Mouth/Throat: Oropharynx is clear and moist. No oropharyngeal exudate.  Eyes: Conjunctivae and EOM are normal. Pupils are equal, round, and reactive to light. No scleral icterus.  Neck: No JVD present. No tracheal deviation present. No thyromegaly present.  Cardiovascular: Normal rate, regular rhythm and intact distal pulses.  Exam reveals no gallop and no friction rub.   Murmur heard. Systolic murmur [id number] the right sternal border.   Pulmonary/Chest: Effort normal. No respiratory distress. She has no wheezes. She has no rales. She exhibits no tenderness.  Abdominal: She exhibits no distension and no mass. There is no tenderness.  Genitourinary:  Genitourinary Comments: External hemorrhoids.   Musculoskeletal: Normal range of motion. She exhibits edema and tenderness.  Generalized weakness. Left knee effusion. Left short arm cast. Pain left knee>right knee, lower back, left wrist.  RLE trace edema  Lymphadenopathy:    She has no cervical adenopathy.  Neurological: She is alert and oriented to person, place, and time. No cranial nerve  deficit. Coordination normal.  Skin: No rash noted. She is not diaphoretic. No erythema. No pallor.  Purple left hand. Ecchymosis of the left lower leg. RLE lower lateral hematoma.   Psychiatric: Her behavior is normal. Judgment and thought content normal.  Frustrated and depressed about her current situation.    Labs reviewed:  Recent Labs  01/17/16 1440 02/13/16  NA 141 139  K 4.1 3.7  CL 104  --   CO2 28  --  GLUCOSE 119*  --   BUN 36* 33*  CREATININE 1.47* 1.4*  CALCIUM 9.1  --     Recent Labs  02/13/16  AST 14  ALT 9  ALKPHOS 75    Recent Labs  01/17/16 1440 02/13/16  WBC 9.9 7.2  NEUTROABS 8.1*  --   HGB 11.9* 11.4*  HCT 36.8 35*  MCV 87.0  --   PLT 199 144*   Lab Results  Component Value Date   TSH 3.53 02/13/2016   No results found for: HGBA1C No results found for: CHOL, HDL, LDLCALC, LDLDIRECT, TRIG, CHOLHDL  Significant Diagnostic Results in last 30 days:  No results found.  Assessment/Plan Leg hematoma, right, initial encounter Warm compress 4x/day x 2 weeks. Observe the patient.   HTN (hypertension) Controlled, continue Amlodipine 50mg , Hydralazi ne 50mg  tid, Furosemide 40mg  daily. 02/13/16 wbc 7.2, Hgb 11.4, plt 144, Na 139, K 3.7, bun 33, creat 1.4, TSH 3.53   Hiatal hernia Stable, continue Omeprazole 20mg  daily.   IBS (irritable bowel syndrome) Stable, continue MiraLax daily.   Hypothyroidism Continue Levothyroxine 68mcg, TSH 3.53 02/13/16  Chronic kidney disease, stage III/IV 02/13/16 Bun 33, creat 1.4  Polymyalgia rheumatica Continue Prednisone   Edema Trace edema BLE, continue Furosemide 40mg   Depression with anxiety Stable, continue Alprazolam 0.25mg  tid(pharm recommendation from qid)      Family/ staff Communication: SNF  Labs/tests ordered:  none

## 2016-02-29 NOTE — Assessment & Plan Note (Signed)
Stable, continue MiraLax daily.  

## 2016-02-29 NOTE — Assessment & Plan Note (Signed)
Trace edema BLE, continue Furosemide 40mg 

## 2016-02-29 NOTE — Assessment & Plan Note (Signed)
Warm compress 4x/day x 2 weeks. Observe the patient.

## 2016-02-29 NOTE — Assessment & Plan Note (Signed)
Continue Levothyroxine 50mcg, TSH 3.53 02/13/16 

## 2016-02-29 NOTE — Assessment & Plan Note (Signed)
Stable, continue Omeprazole 20mg daily.  

## 2016-03-08 DIAGNOSIS — S52592D Other fractures of lower end of left radius, subsequent encounter for closed fracture with routine healing: Secondary | ICD-10-CM | POA: Diagnosis not present

## 2016-03-11 ENCOUNTER — Non-Acute Institutional Stay (SKILLED_NURSING_FACILITY): Payer: Medicare Other | Admitting: Nurse Practitioner

## 2016-03-11 ENCOUNTER — Encounter: Payer: Self-pay | Admitting: Nurse Practitioner

## 2016-03-11 DIAGNOSIS — E039 Hypothyroidism, unspecified: Secondary | ICD-10-CM | POA: Diagnosis not present

## 2016-03-11 DIAGNOSIS — R609 Edema, unspecified: Secondary | ICD-10-CM | POA: Diagnosis not present

## 2016-03-11 DIAGNOSIS — I10 Essential (primary) hypertension: Secondary | ICD-10-CM | POA: Diagnosis not present

## 2016-03-11 DIAGNOSIS — N183 Chronic kidney disease, stage 3 unspecified: Secondary | ICD-10-CM

## 2016-03-11 DIAGNOSIS — M353 Polymyalgia rheumatica: Secondary | ICD-10-CM | POA: Diagnosis not present

## 2016-03-11 DIAGNOSIS — F418 Other specified anxiety disorders: Secondary | ICD-10-CM

## 2016-03-11 DIAGNOSIS — J340 Abscess, furuncle and carbuncle of nose: Secondary | ICD-10-CM | POA: Diagnosis not present

## 2016-03-11 DIAGNOSIS — K449 Diaphragmatic hernia without obstruction or gangrene: Secondary | ICD-10-CM

## 2016-03-11 NOTE — Assessment & Plan Note (Signed)
Continue Prednisone

## 2016-03-11 NOTE — Assessment & Plan Note (Signed)
Stable, continue Alprazolam 0.25mg  tid(pharm recommendation from qid)

## 2016-03-11 NOTE — Assessment & Plan Note (Signed)
Trace edema BLE, continue Furosemide 40mg 

## 2016-03-11 NOTE — Assessment & Plan Note (Signed)
A pimple noted in the right nostril, mild warmth, tenderness in the right nostril, right face around the right nose. Will apply Bactroban bid, Doxycycline 100mg  bid x 7 days.

## 2016-03-11 NOTE — Assessment & Plan Note (Signed)
Continue Levothyroxine 50mcg, TSH 3.53 02/13/16 

## 2016-03-11 NOTE — Assessment & Plan Note (Signed)
Controlled, continue Amlodipine 50mg, Hydralazi ne 50mg tid, Furosemide 40mg daily. 02/13/16 wbc 7.2, Hgb 11.4, plt 144, Na 139, K 3.7, bun 33, creat 1.4, TSH 3.53  

## 2016-03-11 NOTE — Assessment & Plan Note (Signed)
Creat 1.4 02/13/16 

## 2016-03-11 NOTE — Assessment & Plan Note (Signed)
Stable, continue Omeprazole 20mg daily.  

## 2016-03-11 NOTE — Progress Notes (Signed)
Location:  [facility] [place] Room Number: 47 Place of Service:  SNF (31) Provider: Nashae Maudlin, [place]  NP  Jeanmarie Hubert, MD  Patient Care Team: Estill Dooms, MD as PCP - General (Internal Medicine) Laurence Spates, MD as Consulting Physician (Gastroenterology) Hennie Duos, MD as Consulting Physician (Rheumatology) Corliss Parish, MD as Consulting Physician (Nephrology) Jettie Booze, MD as Consulting Physician (Cardiology) Glenna Fellows, MD as Attending Physician (Neurosurgery) Susa Day, MD as Consulting Physician (Orthopedic Surgery) Domonik Levario Otho Darner, NP as Nurse Practitioner (Internal Medicine)  Extended Emergency Contact Information Primary Emergency Contact: [facility] Address: [address]          [place], [place] [address] [name] of [place] Phone: [phone] Mobile Phone: [phone] Relation: Daughter Secondary Emergency Contact: Donnald Garre Address: [place], [place] Phone: [phone] Relation: Son  Code Status:  DNR Goals of care: Advanced Directive information Advanced Directives 03/11/2016  Does Patient Have a Medical Advance Directive? Yes  Type of Advance Directive Out of facility DNR (pink MOST or yellow form)  Does patient want to make changes to medical advance directive? No - Patient declined  Copy of [place] in Chart? -  Would patient like information on creating a medical advance directive? -  Pre-existing out of facility DNR order (yellow form or pink MOST form) Yellow form placed in chart (order not valid for inpatient use)     Chief Complaint  Patient presents with  . Acute Visit    sinus pain, stuffy nose, slightly puffy eye (R)    HPI:  Pt is a 83 y.o. female seen today for an acute visit for Folliculitis right nostril, mild cellulitis in the area, tenderness and warmth noted, she is afebrile, denied sore throat, ear pain, cough, chest  pain, or palpitation.   ED 01/18/16, The radial fx was reduced and she was put in sugar tong splint, then replaced with a short cast, f/u Ortho 02/19/16. [place] 01/17/16. Ct brain showed SVD,  Hx of HTN, controlled, taking Amlodipine, Hydralazine, Furosemide. Mood is managed with Alprazolam, Sertraline. Thyroid, taking Levothyroxine, last TSH 4.40 12/29/14, 3.53 02/13/16     Past Medical History:  Diagnosis Date  . Anxiety   . Aortic stenosis    a. Mild AS by echo 03/2013.  Marland Kitchen Arthritis   . Bilateral knee pain 06/10/2012  . Cardiomegaly 12/23/2014  . Cataract   . Chronic kidney disease    Stage III/IV  . Colitis, collagenous 07/11/2011  . Corticosteroid dependence ([place]) 12/17/2014  . DDD (degenerative disc disease), thoracolumbar 12/23/2014  . Debility 12/23/2014  . Diverticulitis   . Diverticulosis of colon without hemorrhage 12/23/2014  . Dysphagia 12/23/2014  . Edema 12/23/2014  . Effusion of left knee 12/23/2014  . Enteritis 12/17/2014  . Hiatal hernia 12/23/2014  . HNP (herniated nucleus pulposus), lumbar 12/23/2014   L 4-5   . Hyperlipidemia   . Hypertension   . Hypokalemia   . Hypothyroidism   . IBS (irritable bowel syndrome) 12/23/2014  . Irritable bowel   . Noninfected skin tear of leg 12/23/2014   Left leg   . Osteoarthritis of left knee 12/23/2014  . Osteoporosis   . PMR (polymyalgia rheumatica) (HCC)    a. On chronic prednisone.  . Polymyalgia rheumatica ([place]) 07/11/2011  . Premature atrial contractions    a. Noted during adm 03/2013, corresponding with patient's palpitations.  . Scoliosis (and  kyphoscoliosis), idiopathic 12/23/2014  . Spinal stenosis   . Spinal stenosis of lumbar region 12/23/2014  . Symptomatic bradycardia    a. Adm 03/2013 - taken off clonidine and BB.  . T11 vertebral fracture ([place]) 12/23/2014  . Unstable gait 12/23/2014   Past Surgical History:  Procedure Laterality Date  . ABDOMINAL HYSTERECTOMY  1966  . APPENDECTOMY    . BACK SURGERY    . Breast  biopsies    . CHOLECYSTECTOMY    . EYE SURGERY      Allergies  Allergen Reactions  . Codeine Nausea And Vomiting  . Fosamax [Alendronate Sodium]     Unknown.   Dion Saucier [Calcitonin]     Unknown.  . Nsaids     Due to renal function.   . Quinolones Other (See Comments)    platelets  . Sulfa Antibiotics Nausea And Vomiting    Allergies as of 03/11/2016      Reactions   Codeine Nausea And Vomiting   Fosamax [alendronate Sodium]    Unknown.    Miacalcin [calcitonin]    Unknown.   Nsaids    Due to renal function.    Quinolones Other (See Comments)   platelets   Sulfa Antibiotics Nausea And Vomiting      Medication List       Accurate as of 03/11/16 12:19 PM. Always use your most recent med list.          ALPRAZolam 0.25 MG tablet Commonly known as:  XANAX Take 0.25 mg by mouth 4 (four) times daily.   amLODipine 5 MG tablet Commonly known as:  NORVASC Take 5 mg by mouth daily.   furosemide 40 MG tablet Commonly known as:  LASIX Take 40 mg by mouth every morning.   hydrALAZINE 50 MG tablet Commonly known as:  APRESOLINE Take 1 tablet (50 mg total) by mouth 3 (three) times daily. (Every 8 hours)   HYDROcodone-acetaminophen 10-325 MG tablet Commonly known as:  NORCO Take 1 tablet by mouth every 6 (six) hours as needed.   levothyroxine 50 MCG tablet Commonly known as:  SYNTHROID, LEVOTHROID Take 50 mcg by mouth daily.   omeprazole 20 MG capsule Commonly known as:  PRILOSEC Take 20 mg by mouth daily.   potassium chloride SA 20 MEQ tablet Commonly known as:  K-DUR,KLOR-CON Take 1 tablet (20 mEq total) by mouth 2 (two) times daily.   predniSONE 1 MG tablet Commonly known as:  DELTASONE Take 1 mg by mouth daily with breakfast. Take with prednisone 5mg    predniSONE 5 MG tablet Commonly known as:  DELTASONE Take 5 mg by mouth every morning. Take with prednisone 1 mg   sertraline 50 MG tablet Commonly known as:  ZOLOFT Take 50 mg by mouth daily. Take  with 25 mg   sertraline 25 MG tablet Commonly known as:  ZOLOFT Take 25 mg by mouth at bedtime.   Vitamin D 2000 units Caps Take 2,000 Units by mouth daily.       Review of Systems  Constitutional: Negative for activity change, appetite change, chills, diaphoresis, fatigue, fever and unexpected weight change.  HENT: Negative for congestion, ear discharge, ear pain, hearing loss, postnasal drip, rhinorrhea, sore throat, tinnitus, trouble swallowing and voice change.        Folliculitis right nostril, mild cellulitis in the area  Eyes: Positive for visual disturbance (corrective lenses). Negative for pain, redness and itching.  Respiratory: Negative for cough, choking, shortness of breath and wheezing.   Cardiovascular: Positive  for leg swelling. Negative for chest pain and palpitations.  Gastrointestinal: Negative for abdominal distention, abdominal pain, constipation, diarrhea and nausea.  Endocrine: Negative for cold intolerance, heat intolerance, polydipsia, polyphagia and polyuria.  Genitourinary: Negative for difficulty urinating, dysuria, flank pain, frequency, hematuria, pelvic pain, urgency and vaginal discharge.  Musculoskeletal: Positive for arthralgias, back pain and joint swelling (lefet hjand and wrist. Both knees). Negative for gait problem, myalgias, neck pain and neck stiffness.       Fx left radius at wrist  Skin: Negative for color change, pallor and rash.       Purple left hand RLE hematoma.   Allergic/Immunologic: Negative.   Neurological: Positive for weakness. Negative for dizziness, tremors, seizures, syncope, numbness and headaches.  Hematological: Negative for adenopathy. Does not bruise/bleed easily.  Psychiatric/Behavioral: Positive for dysphoric mood. Negative for agitation, behavioral problems, confusion, hallucinations, sleep disturbance and suicidal ideas. The patient is not nervous/anxious and is not hyperactive.     Immunization History  Administered  Date(s) Administered  . Influenza-Unspecified 11/01/2013  . Pneumococcal Polysaccharide-23 03/22/2004  . Tdap 11/21/2011  . Zoster 06/22/2009   Pertinent  Health Maintenance Due  Topic Date Due  . DEXA SCAN  05/07/1984  . PNA vac Low Risk Adult (2 of 2 - PCV13) 03/22/2005  . INFLUENZA VACCINE  08/15/2015   Fall Risk  01/17/2016  Falls in the past year? Yes  Number falls in past yr: 2 or more   Functional Status Survey:    Vitals:   03/11/16 1116  BP: 132/68  Pulse: 82  Resp: 18  Temp: 97.4 F (36.3 C)  Weight: 126 lb 12.8 oz (57.5 kg)  Height: 5\' 4"  (1.626 m)   Body mass index is 21.77 kg/m. Physical Exam  Labs reviewed:  Recent Labs  01/17/16 1440 02/13/16  NA 141 139  K 4.1 3.7  CL 104  --   CO2 28  --   GLUCOSE 119*  --   BUN 36* 33*  CREATININE 1.47* 1.4*  CALCIUM 9.1  --     Recent Labs  02/13/16  AST 14  ALT 9  ALKPHOS 75    Recent Labs  01/17/16 1440 02/13/16  WBC 9.9 7.2  NEUTROABS 8.1*  --   HGB 11.9* 11.4*  HCT 36.8 35*  MCV 87.0  --   PLT 199 144*   Lab Results  Component Value Date   TSH 3.53 02/13/2016   No results found for: HGBA1C No results found for: CHOL, HDL, LDLCALC, LDLDIRECT, TRIG, CHOLHDL  Significant Diagnostic Results in last 30 days:  No results found.  Assessment/Plan Cellulitis of nostril A pimple noted in the right nostril, mild warmth, tenderness in the right nostril, right face around the right nose. Will apply Bactroban bid, Doxycycline 100mg  bid x 7 days.   HTN (hypertension) Controlled, continue Amlodipine 50mg , Hydralazi ne 50mg  tid, Furosemide 40mg  daily. 02/13/16 wbc 7.2, Hgb 11.4, plt 144, Na 139, K 3.7, bun 33, creat 1.4, TSH 3.53   Hiatal hernia Stable, continue Omeprazole 20mg  daily.   Hypothyroidism Continue Levothyroxine 103mcg, TSH 3.53 02/13/16  Chronic kidney disease, stage III/IV Creat 1.4 02/13/16  Polymyalgia rheumatica Continue Prednisone   Edema Trace edema BLE, continue  Furosemide 40mg   Depression with anxiety Stable, continue Alprazolam 0.25mg  tid(pharm recommendation from qid)      Family/ staff Communication: SNF  Labs/tests ordered:  none

## 2016-03-27 DIAGNOSIS — G5602 Carpal tunnel syndrome, left upper limb: Secondary | ICD-10-CM | POA: Diagnosis not present

## 2016-03-27 DIAGNOSIS — S52592D Other fractures of lower end of left radius, subsequent encounter for closed fracture with routine healing: Secondary | ICD-10-CM | POA: Diagnosis not present

## 2016-03-30 ENCOUNTER — Encounter: Payer: Self-pay | Admitting: Emergency Medicine

## 2016-03-30 ENCOUNTER — Emergency Department (INDEPENDENT_AMBULATORY_CARE_PROVIDER_SITE_OTHER)
Admission: EM | Admit: 2016-03-30 | Discharge: 2016-03-30 | Disposition: A | Discharge status: Home / Self Care | Payer: Medicare Other | Source: Home / Self Care | Attending: Family Medicine | Admitting: Family Medicine

## 2016-03-30 DIAGNOSIS — S61511A Laceration without foreign body of right wrist, initial encounter: Secondary | ICD-10-CM | POA: Diagnosis not present

## 2016-03-30 NOTE — ED Provider Notes (Signed)
[facility]    CSN: [id number] Arrival date & time: 03/30/16  0947     History   Chief Complaint Chief Complaint  Patient presents with  . Arm Injury    right    HPI MAUDIE SHINGLEDECKER is a 83 y.o. female.   Patient lacerated her right forearm this morning on a coat hanger wire.  Her last Tdap was 11/21/11.   The history is provided by the patient and a relative. The history is limited by the absence of a caregiver.  Arm Injury  Location:  Arm Arm location:  R forearm Pain details:    Quality:  Aching   Radiates to:  Does not radiate   Severity:  Mild   Onset quality:  Sudden   Duration:  3 weeks   Timing:  Constant   Progression:  Unchanged Foreign body present:  No foreign bodies Tetanus status:  Up to date Prior injury to area:  No Relieved by:  None tried Worsened by:  Nothing Ineffective treatments:  None tried Associated symptoms: no decreased range of motion, no numbness, no stiffness, no swelling and no tingling     Past Medical History:  Diagnosis Date  . Anxiety   . Aortic stenosis    a. Mild AS by echo 03/2013.  Marland Kitchen Arthritis   . Bilateral knee pain 06/10/2012  . Cardiomegaly 12/23/2014  . Cataract   . Chronic kidney disease    Stage III/IV  . Colitis, collagenous 07/11/2011  . Corticosteroid dependence ([place]) 12/17/2014  . DDD (degenerative disc disease), thoracolumbar 12/23/2014  . Debility 12/23/2014  . Diverticulitis   . Diverticulosis of colon without hemorrhage 12/23/2014  . Dysphagia 12/23/2014  . Edema 12/23/2014  . Effusion of left knee 12/23/2014  . Enteritis 12/17/2014  . Hiatal hernia 12/23/2014  . HNP (herniated nucleus pulposus), lumbar 12/23/2014   L 4-5   . Hyperlipidemia   . Hypertension   . Hypokalemia   . Hypothyroidism   . IBS (irritable bowel syndrome) 12/23/2014  . Irritable bowel   . Noninfected skin tear of leg 12/23/2014   Left leg   . Osteoarthritis of left knee 12/23/2014  . Osteoporosis   . PMR (polymyalgia  rheumatica) (HCC)    a. On chronic prednisone.  . Polymyalgia rheumatica ([place]) 07/11/2011  . Premature atrial contractions    a. Noted during adm 03/2013, corresponding with patient's palpitations.  . Scoliosis (and kyphoscoliosis), idiopathic 12/23/2014  . Spinal stenosis   . Spinal stenosis of lumbar region 12/23/2014  . Symptomatic bradycardia    a. Adm 03/2013 - taken off clonidine and BB.  . T11 vertebral fracture ([place]) 12/23/2014  . Unstable gait 12/23/2014    Patient Active Problem List   Diagnosis Date Noted  . Cellulitis of nostril 03/11/2016  . Leg hematoma, right, initial encounter 02/29/2016  . External hemorrhoids 02/12/2016  . Fracture of distal end of left radius 01/22/2016  . GERD (gastroesophageal reflux disease) 12/28/2014  . Depression with anxiety 12/28/2014  . Debility 12/23/2014  . Osteoarthritis of left knee 12/23/2014  . Effusion of left knee 12/23/2014  . Noninfected skin tear of leg 12/23/2014  . Scoliosis (and kyphoscoliosis), idiopathic 12/23/2014  . T11 vertebral fracture ([place]) 12/23/2014  . Gait abnormality 12/23/2014  . Edema 12/23/2014  . Dysphagia 12/23/2014  . Spinal stenosis of lumbar region 12/23/2014  . DDD (degenerative disc disease), thoracolumbar 12/23/2014  . IBS (irritable bowel syndrome) 12/23/2014  . Hiatal hernia 12/23/2014  . Diverticulosis of colon  without hemorrhage 12/23/2014  . Cardiomegaly 12/23/2014  . HNP (herniated nucleus pulposus), lumbar 12/23/2014  . Enteritis 12/17/2014  . Sepsis ([place]) 12/17/2014  . Corticosteroid dependence ([place]) 12/17/2014  . Premature atrial contractions   . Aortic stenosis   . Symptomatic bradycardia 04/02/2013  . Chronic kidney disease, stage III/IV   . Bilateral knee pain 06/10/2012  . Polymyalgia rheumatica ([place]) 07/11/2011  . Colitis, collagenous 07/11/2011  . Hypothyroidism 07/11/2011  . HTN (hypertension) 07/11/2011    Past Surgical History:  Procedure Laterality Date  . ABDOMINAL  HYSTERECTOMY  1966  . APPENDECTOMY    . BACK SURGERY    . Breast biopsies    . CHOLECYSTECTOMY    . EYE SURGERY      OB History    No data available       Home Medications    Prior to Admission medications   Medication Sig Start Date End Date Taking? Authorizing Provider  ALPRAZolam (XANAX) 0.25 MG tablet Take 0.25 mg by mouth 4 (four) times daily.    Historical Provider, MD  amLODipine (NORVASC) 5 MG tablet Take 5 mg by mouth daily.    Historical Provider, MD  Cholecalciferol (VITAMIN D) 2000 UNITS CAPS Take 2,000 Units by mouth daily.    Historical Provider, MD  furosemide (LASIX) 40 MG tablet Take 40 mg by mouth every morning.     Historical Provider, MD  hydrALAZINE (APRESOLINE) 50 MG tablet Take 1 tablet (50 mg total) by mouth 3 (three) times daily. (Every 8 hours) 04/04/13   Dayna N Dunn, PA-C  HYDROcodone-acetaminophen (NORCO) 10-325 MG tablet Take 1 tablet by mouth every 6 (six) hours as needed.    Historical Provider, MD  levothyroxine (SYNTHROID, LEVOTHROID) 50 MCG tablet Take 50 mcg by mouth daily.     Historical Provider, MD  omeprazole (PRILOSEC) 20 MG capsule Take 20 mg by mouth daily.    Historical Provider, MD  potassium chloride SA (K-DUR,KLOR-CON) 20 MEQ tablet Take 1 tablet (20 mEq total) by mouth 2 (two) times daily. 04/04/13   Dayna N Dunn, PA-C  predniSONE (DELTASONE) 1 MG tablet Take 1 mg by mouth daily with breakfast. Take with prednisone 5mg     Historical Provider, MD  predniSONE (DELTASONE) 5 MG tablet Take 5 mg by mouth every morning. Take with prednisone 1 mg    Historical Provider, MD  sertraline (ZOLOFT) 25 MG tablet Take 25 mg by mouth at bedtime.     Historical Provider, MD  sertraline (ZOLOFT) 50 MG tablet Take 50 mg by mouth daily. Take with 25 mg    Historical Provider, MD    Family History Family History  Problem Relation Age of Onset  . Hyperlipidemia Mother   . Hypertension Mother   . Hypertension Father   . Hyperlipidemia Father   .  Hyperlipidemia Sister   . Hypertension Sister   . Diabetes Neg Hx   . Heart attack Neg Hx   . Sudden death Neg Hx     Social History Social History  Substance Use Topics  . Smoking status: Never Smoker  . Smokeless tobacco: Never Used  . Alcohol use No     Allergies   Codeine; Fosamax [alendronate sodium]; Miacalcin [calcitonin]; Nsaids; Quinolones; and Sulfa antibiotics   Review of Systems Review of Systems  Musculoskeletal: Negative for stiffness.  All other systems reviewed and are negative.    Physical Exam Triage Vital Signs ED Triage Vitals  Enc Vitals Group     BP 03/30/16 1101 Marland Kitchen)  149/65     Pulse Rate 03/30/16 1101 64     Resp 03/30/16 1101 16     Temp 03/30/16 1101 97.8 F (36.6 C)     Temp Source 03/30/16 1101 Oral     SpO2 03/30/16 1101 98 %     Weight 03/30/16 1102 130 lb (59 kg)     Height 03/30/16 1102 5\' 4"  (1.626 m)     Head Circumference --      Peak Flow --      Pain Score 03/30/16 1103 0     Pain Loc --      Pain Edu? --      Excl. in [place]? --    No data found.   Updated Vital Signs BP (!) 149/65 (BP Location: Left Arm)   Pulse 64   Temp 97.8 F (36.6 C) (Oral)   Resp 16   Ht 5\' 4"  (1.626 m)   Wt 130 lb (59 kg)   SpO2 98%   BMI 22.31 kg/m   Visual Acuity Right Eye Distance:   Left Eye Distance:   Bilateral Distance:    Right Eye Near:   Left Eye Near:    Bilateral Near:     Physical Exam  Constitutional: She appears well-developed and well-nourished. No distress.  HENT:  Head: Normocephalic.  Mouth/Throat: Oropharynx is clear and moist.  Eyes: Pupils are equal, round, and reactive to light.  Neck: Normal range of motion.  Cardiovascular: Normal rate.   Pulmonary/Chest: Effort normal.  Musculoskeletal:       Right forearm: She exhibits laceration. She exhibits no tenderness and no swelling.       Arms: On patient's right forearm is a superficial L-shaped skin tear measuring about 5cm along its edges.  There is a small  80mm skin tear just proximal to the larger tear.  Neurological: She is alert.  Skin: Skin is warm and dry.  Nursing note and vitals reviewed.    UC Treatments / Results  Labs (all labs ordered are listed, but only abnormal results are displayed) Labs Reviewed - No data to display  EKG  EKG Interpretation None       Radiology No results found.  Procedures Procedures  Laceration Repair (Dermabond) Discussed benefits and risks of procedure and verbal consent obtained. Using sterile technique, cleansed wound with Betadine followed by copious lavage with normal saline.  Wound carefully inspected for debris and foreign bodies; none found.  Wound edges carefully approximated in normal anatomic position and closed with Dermabond.  Applied Telfa pad over wound, followed by light ace wrap to minimize swelling.  Wound precautions explained to patient.     Medications Ordered in UC Medications - No data to display   Initial Impression / Assessment and Plan / UC Course  I have reviewed the triage vital signs and the nursing notes.  Pertinent labs & imaging results that were available during my care of the patient were reviewed by me and considered in my medical decision making (see chart for details).    May apply light Ace wrap over a Telfa pad to prevent swelling.  Follow instructions on Dermabond instruction sheet.  Followup with healthcare provider for any signs of infection:  redness, swelling, drainage, pain, etc.    Final Clinical Impressions(s) / UC Diagnoses   Final diagnoses:  Tear of skin of right wrist, initial encounter    New Prescriptions New Prescriptions   No medications on file     Kandra Nicolas,  MD 04/07/16 1431

## 2016-03-30 NOTE — ED Triage Notes (Signed)
Patient states lacerated right forearm with clothes hanger this morning. Not in pain at moment.

## 2016-03-30 NOTE — Discharge Instructions (Signed)
May apply light Ace wrap over a Telfa pad to prevent swelling.  Follow instructions on Dermabond instruction sheet.  Followup with healthcare provider for any signs of infection:  redness, swelling, drainage, pain, etc.

## 2016-04-03 ENCOUNTER — Encounter: Payer: Self-pay | Admitting: Nurse Practitioner

## 2016-04-03 ENCOUNTER — Non-Acute Institutional Stay (SKILLED_NURSING_FACILITY): Payer: Medicare Other | Admitting: Nurse Practitioner

## 2016-04-03 DIAGNOSIS — M353 Polymyalgia rheumatica: Secondary | ICD-10-CM

## 2016-04-03 DIAGNOSIS — M150 Primary generalized (osteo)arthritis: Secondary | ICD-10-CM

## 2016-04-03 DIAGNOSIS — F418 Other specified anxiety disorders: Secondary | ICD-10-CM

## 2016-04-03 DIAGNOSIS — I10 Essential (primary) hypertension: Secondary | ICD-10-CM | POA: Diagnosis not present

## 2016-04-03 DIAGNOSIS — S52502A Unspecified fracture of the lower end of left radius, initial encounter for closed fracture: Secondary | ICD-10-CM

## 2016-04-03 DIAGNOSIS — E039 Hypothyroidism, unspecified: Secondary | ICD-10-CM

## 2016-04-03 DIAGNOSIS — R609 Edema, unspecified: Secondary | ICD-10-CM | POA: Diagnosis not present

## 2016-04-03 DIAGNOSIS — M25552 Pain in left hip: Secondary | ICD-10-CM | POA: Diagnosis not present

## 2016-04-03 DIAGNOSIS — M159 Polyosteoarthritis, unspecified: Secondary | ICD-10-CM

## 2016-04-03 DIAGNOSIS — N183 Chronic kidney disease, stage 3 unspecified: Secondary | ICD-10-CM

## 2016-04-03 DIAGNOSIS — S81801D Unspecified open wound, right lower leg, subsequent encounter: Secondary | ICD-10-CM

## 2016-04-03 DIAGNOSIS — R269 Unspecified abnormalities of gait and mobility: Secondary | ICD-10-CM

## 2016-04-03 DIAGNOSIS — S81802A Unspecified open wound, left lower leg, initial encounter: Secondary | ICD-10-CM | POA: Insufficient documentation

## 2016-04-03 DIAGNOSIS — K219 Gastro-esophageal reflux disease without esophagitis: Secondary | ICD-10-CM | POA: Diagnosis not present

## 2016-04-03 DIAGNOSIS — M25572 Pain in left ankle and joints of left foot: Secondary | ICD-10-CM | POA: Diagnosis not present

## 2016-04-03 DIAGNOSIS — M1990 Unspecified osteoarthritis, unspecified site: Secondary | ICD-10-CM | POA: Insufficient documentation

## 2016-04-03 NOTE — Assessment & Plan Note (Addendum)
Stable, continue Alprazolam 0.25mg  qid (pharm recommendation tid from qid), Sertraline 75mg  daily

## 2016-04-03 NOTE — Assessment & Plan Note (Signed)
Healing nicely, off cast.

## 2016-04-03 NOTE — Assessment & Plan Note (Signed)
[place] 04/03/16, c/o left hip and ankle pain, able to bear weight, no noted deformity, taking Norco 10/325mg  q6 for OA left knee, left wrist pain, back pain already, will obtain X-ray left hip and ankle to r/o fxs, observe.

## 2016-04-03 NOTE — Assessment & Plan Note (Signed)
Stable, continue Omeprazole 20mg daily.  

## 2016-04-03 NOTE — Assessment & Plan Note (Addendum)
Continue Prednisone 1mg  with breakfast, 5mg  at 10am.

## 2016-04-03 NOTE — Assessment & Plan Note (Signed)
Trace edema BLE, continue Furosemide 40mg 

## 2016-04-03 NOTE — Assessment & Plan Note (Signed)
Contributory to her falling, safety precaution

## 2016-04-03 NOTE — Progress Notes (Signed)
Location:  [facility] Room Number: 80 Place of Service:  SNF (31) Provider:  Shakim Faith, Manxie  NP  Jeanmarie Hubert, MD  Patient Care Team: Estill Dooms, MD as PCP - General (Internal Medicine) Laurence Spates, MD as Consulting Physician (Gastroenterology) Hennie Duos, MD as Consulting Physician (Rheumatology) Corliss Parish, MD as Consulting Physician (Nephrology) Jettie Booze, MD as Consulting Physician (Cardiology) Glenna Fellows, MD as Attending Physician (Neurosurgery) Susa Day, MD as Consulting Physician (Orthopedic Surgery) Loman Logan Otho Darner, NP as Nurse Practitioner (Internal Medicine)  Extended Emergency Contact Information Primary Emergency Contact: [facility] Address: [address]          [place], [place] [address] Christina Cabrera of [place] Phone: [phone] Mobile Phone: [phone] Relation: Daughter Secondary Emergency Contact: Donnald Garre Address: [place], [place] Phone: [phone] Mobile Phone: [phone] Relation: Son  Code Status:  DNR Goals of care: Advanced Directive information Advanced Directives 04/03/2016  Does Patient Have a Medical Advance Directive? Yes  Type of Advance Directive Out of facility DNR (pink MOST or yellow form)  Does patient want to make changes to medical advance directive? No - Patient declined  Copy of [place] in Chart? -  Would patient like information on creating a medical advance directive? -  Pre-existing out of facility DNR order (yellow form or pink MOST form) Yellow form placed in chart (order not valid for inpatient use)     Chief Complaint  Patient presents with  . Acute Visit    Patient fell this morning,    HPI:  Pt is a 83 y.o. female seen today for an acute visit for [place] 04/03/16, stated her walker rolled away from her, landed on the left side, c/o left hip and ankle pain, able to bear weight, no  deformity noted, she takes Norco 10/325mg  q6h.     Lateral right lower leg open wound, no s/s of infection. Distal right forearm skin tear, proximate with steri strips, healing nicely.     ED 03/30/16 distal right forearm skin tear, proximate with Steri strips.    ED 01/18/16, The radial fx was reduced and she was put in sugar tong splint, then replaced with a short cast, f/u Ortho 02/19/16. [place] 01/17/16. Ct brain showed SVD,    Hx of HTN, controlled, taking Amlodipine, Hydralazine, Furosemide. Mood is managed with Alprazolam, Sertraline. Thyroid, taking Levothyroxine, last TSH 4.40 12/29/14, 3.53 02/13/16     Past Medical History:  Diagnosis Date  . Anxiety   . Aortic stenosis    a. Mild AS by echo 03/2013.  Marland Kitchen Arthritis   . Bilateral knee pain 06/10/2012  . Cardiomegaly 12/23/2014  . Cataract   . Chronic kidney disease    Stage III/IV  . Colitis, collagenous 07/11/2011  . Corticosteroid dependence ([place]) 12/17/2014  . DDD (degenerative disc disease), thoracolumbar 12/23/2014  . Debility 12/23/2014  . Diverticulitis   . Diverticulosis of colon without hemorrhage 12/23/2014  . Dysphagia 12/23/2014  . Edema 12/23/2014  . Effusion of left knee 12/23/2014  . Enteritis 12/17/2014  . Hiatal hernia 12/23/2014  . HNP (herniated nucleus pulposus), lumbar 12/23/2014   L 4-5   . Hyperlipidemia   . Hypertension   . Hypokalemia   . Hypothyroidism   . IBS (irritable bowel syndrome) 12/23/2014  . Irritable bowel   . Noninfected skin tear of leg 12/23/2014   Left leg   .  Osteoarthritis of left knee 12/23/2014  . Osteoporosis   . PMR (polymyalgia rheumatica) ([facility])    a. On chronic prednisone.  . Polymyalgia rheumatica ([place]) 07/11/2011  . Premature atrial contractions    a. Noted during adm 03/2013, corresponding with patient's palpitations.  . Scoliosis (and kyphoscoliosis), idiopathic 12/23/2014  . Spinal stenosis   . Spinal stenosis of lumbar region 12/23/2014  . Symptomatic bradycardia    a.  Adm 03/2013 - taken off clonidine and BB.  . T11 vertebral fracture ([place]) 12/23/2014  . Unstable gait 12/23/2014   Past Surgical History:  Procedure Laterality Date  . ABDOMINAL HYSTERECTOMY  1966  . APPENDECTOMY    . BACK SURGERY    . Breast biopsies    . CHOLECYSTECTOMY    . EYE SURGERY      Allergies  Allergen Reactions  . Codeine Nausea And Vomiting  . Fosamax [Alendronate Sodium]     Unknown.   Dion Saucier [Calcitonin]     Unknown.  . Nsaids     Due to renal function.   . Quinolones Other (See Comments)    platelets  . Sulfa Antibiotics Nausea And Vomiting    Allergies as of 04/03/2016      Reactions   Codeine Nausea And Vomiting   Fosamax [alendronate Sodium]    Unknown.    Miacalcin [calcitonin]    Unknown.   Nsaids    Due to renal function.    Quinolones Other (See Comments)   platelets   Sulfa Antibiotics Nausea And Vomiting      Medication List       Accurate as of 04/03/16  2:52 PM. Always use your most recent med list.          ALPRAZolam 0.25 MG tablet Commonly known as:  XANAX Take 0.25 mg by mouth 4 (four) times daily.   amLODipine 5 MG tablet Commonly known as:  NORVASC Take 5 mg by mouth daily.   furosemide 40 MG tablet Commonly known as:  LASIX Take 40 mg by mouth every morning.   hydrALAZINE 50 MG tablet Commonly known as:  APRESOLINE Take 1 tablet (50 mg total) by mouth 3 (three) times daily. (Every 8 hours)   HYDROcodone-acetaminophen 10-325 MG tablet Commonly known as:  NORCO Take 1 tablet by mouth every 6 (six) hours as needed.   levothyroxine 50 MCG tablet Commonly known as:  SYNTHROID, LEVOTHROID Take 50 mcg by mouth daily.   omeprazole 20 MG capsule Commonly known as:  PRILOSEC Take 20 mg by mouth daily.   potassium chloride SA 20 MEQ tablet Commonly known as:  K-DUR,KLOR-CON Take 1 tablet (20 mEq total) by mouth 2 (two) times daily.   predniSONE 1 MG tablet Commonly known as:  DELTASONE Take 1 mg by mouth daily  with breakfast. Take with prednisone 5mg    predniSONE 5 MG tablet Commonly known as:  DELTASONE Take 5 mg by mouth every morning. Take with prednisone 1 mg   sertraline 50 MG tablet Commonly known as:  ZOLOFT Take 50 mg by mouth daily. Take with 25 mg   sertraline 25 MG tablet Commonly known as:  ZOLOFT Take 25 mg by mouth at bedtime.   Vitamin D 2000 units Caps Take 2,000 Units by mouth daily.       Review of Systems  Constitutional: Negative for activity change, appetite change, chills, diaphoresis, fatigue, fever and unexpected weight change.  HENT: Negative for congestion, ear discharge, ear pain, hearing loss, postnasal drip, rhinorrhea, sore throat,  tinnitus, trouble swallowing and voice change.        Folliculitis right nostril, mild cellulitis in the area  Eyes: Positive for visual disturbance (corrective lenses). Negative for pain, redness and itching.  Respiratory: Negative for cough, choking, shortness of breath and wheezing.   Cardiovascular: Positive for leg swelling. Negative for chest pain and palpitations.  Gastrointestinal: Negative for abdominal distention, abdominal pain, constipation, diarrhea and nausea.  Endocrine: Negative for cold intolerance, heat intolerance, polydipsia, polyphagia and polyuria.  Genitourinary: Negative for difficulty urinating, dysuria, flank pain, frequency, hematuria, pelvic pain, urgency and vaginal discharge.  Musculoskeletal: Positive for arthralgias, back pain and joint swelling (lefet hjand and wrist. Both knees). Negative for gait problem, myalgias, neck pain and neck stiffness.       Fx left radius at wrist-healed [place] 04/03/16, stated her walker rolled away from her, landed on the left side, c/o left hip and ankle pain, able to bear weight, no deformity noted, she takes Norco 10/325mg  q6h.   Skin: Negative for color change, pallor and rash.       Lateral right lower leg open wound, no s/s of infection. Distal right forearm skin  tear, proximate with steri strips, healing nicely.   Allergic/Immunologic: Negative.   Neurological: Positive for weakness. Negative for dizziness, tremors, seizures, syncope, numbness and headaches.  Hematological: Negative for adenopathy. Does not bruise/bleed easily.  Psychiatric/Behavioral: Positive for dysphoric mood. Negative for agitation, behavioral problems, confusion, hallucinations, sleep disturbance and suicidal ideas. The patient is not nervous/anxious and is not hyperactive.     Immunization History  Administered Date(s) Administered  . Influenza-Unspecified 11/01/2013  . Pneumococcal Polysaccharide-23 03/22/2004  . Tdap 11/21/2011  . Zoster 06/22/2009   Pertinent  Health Maintenance Due  Topic Date Due  . DEXA SCAN  05/07/1984  . PNA vac Low Risk Adult (2 of 2 - PCV13) 03/22/2005  . INFLUENZA VACCINE  08/15/2015   Fall Risk  01/17/2016  Falls in the past year? Yes  Number falls in past yr: 2 or more   Functional Status Survey:    Vitals:   04/03/16 1343  BP: 136/74  Pulse: 76  Resp: 20  Temp: 97.1 F (36.2 C)  Weight: 131 lb 11.2 oz (59.7 kg)  Height: 5\' 4"  (1.626 m)   Body mass index is 22.61 kg/m. Physical Exam  Constitutional: She is oriented to person, place, and time. She appears well-developed and well-nourished. No distress.  Overweight  HENT:  Right Ear: External ear normal.  Left Ear: External ear normal.  Nose: Nose normal.  Mouth/Throat: Oropharynx is clear and moist. No oropharyngeal exudate.  Eyes: Conjunctivae and EOM are normal. Pupils are equal, round, and reactive to light. No scleral icterus.  Neck: No JVD present. No tracheal deviation present. No thyromegaly present.  Cardiovascular: Normal rate, regular rhythm and intact distal pulses.  Exam reveals no gallop and no friction rub.   Murmur heard. Systolic murmur 1-2/4 the right sternal border.   Pulmonary/Chest: Effort normal. No respiratory distress. She has no wheezes. She has no  rales. She exhibits no tenderness.  Abdominal: She exhibits no distension and no mass. There is no tenderness.  Genitourinary:  Genitourinary Comments: External hemorrhoids.   Musculoskeletal: Normal range of motion. She exhibits edema and tenderness.  Generalized weakness. Left knee effusion. Pain left knee>right knee, lower back, left wrist.  RLE trace edema. c/o left hip and ankle pain 04/03/16 since the fall in am, able to bear weight, no noted deformity.   Lymphadenopathy:  She has no cervical adenopathy.  Neurological: She is alert and oriented to person, place, and time. No cranial nerve deficit. Coordination normal.  Skin: No rash noted. She is not diaphoretic. No erythema. No pallor.  Lateral right lower leg open wound, no s/s of infection. Distal right forearm skin tear, proximate with steri strips, healing nicely.    Psychiatric: Her behavior is normal. Judgment and thought content normal.  Frustrated and depressed about her current situation.    Labs reviewed:  Recent Labs  01/17/16 1440 02/13/16  NA 141 139  K 4.1 3.7  CL 104  --   CO2 28  --   GLUCOSE 119*  --   BUN 36* 33*  CREATININE 1.47* 1.4*  CALCIUM 9.1  --     Recent Labs  02/13/16  AST 14  ALT 9  ALKPHOS 75    Recent Labs  01/17/16 1440 02/13/16  WBC 9.9 7.2  NEUTROABS 8.1*  --   HGB 11.9* 11.4*  HCT 36.8 35*  MCV 87.0  --   PLT 199 144*   Lab Results  Component Value Date   TSH 3.53 02/13/2016   No results found for: HGBA1C No results found for: CHOL, HDL, LDLCALC, LDLDIRECT, TRIG, CHOLHDL  Significant Diagnostic Results in last 30 days:  No results found.  Assessment/Plan Osteoarthritis, multiple sites [facility] 04/03/16, c/o left hip and ankle pain, able to bear weight, no noted deformity, taking Norco 10/325mg  q6 for OA left knee, left wrist pain, back pain already, will obtain X-ray left hip and ankle to r/o fxs, observe.   Traumatic open wound of right lower leg Developed from  traumatic hematoma, no s/s of infection, will apply Silvadene bid until healed.   Fracture of distal end of left radius Healing nicely, off cast.   GERD (gastroesophageal reflux disease) Stable, continue Omeprazole 20mg  daily.   HTN (hypertension) Controlled, continue Amlodipine 50mg , Hydralazi ne 50mg  tid, Furosemide 40mg  daily. 02/13/16 wbc 7.2, Hgb 11.4, plt 144, Na 139, K 3.7, bun 33, creat 1.4, TSH 3.53   Hypothyroidism Continue Levothyroxine 50mcg, TSH 3.53 02/13/16  Chronic kidney disease, stage III/IV Creat 1.4 02/13/16  Polymyalgia rheumatica Continue Prednisone 1mg  with breakfast, 5mg  at 10am.    Gait abnormality Contributory to her falling, safety precaution  Edema Trace edema BLE, continue Furosemide 40mg   Depression with anxiety Stable, continue Alprazolam 0.25mg  qid (pharm recommendation tid from qid), Sertraline 75mg  daily      Family/ staff Communication: IL when able.   Labs/tests ordered:  X-ray left hip and ankle.

## 2016-04-03 NOTE — Assessment & Plan Note (Signed)
Developed from traumatic hematoma, no s/s of infection, will apply Silvadene bid until healed.

## 2016-04-03 NOTE — Assessment & Plan Note (Signed)
Controlled, continue Amlodipine 50mg , Hydralazi ne 50mg  tid, Furosemide 40mg  daily. 02/13/16 wbc 7.2, Hgb 11.4, plt 144, Na 139, K 3.7, bun 33, creat 1.4, TSH 3.53

## 2016-04-03 NOTE — Assessment & Plan Note (Signed)
Continue Levothyroxine 78mcg, TSH 3.53 02/13/16

## 2016-04-03 NOTE — Assessment & Plan Note (Signed)
Creat 1.4 02/13/16

## 2016-04-04 ENCOUNTER — Encounter: Payer: Self-pay | Admitting: Internal Medicine

## 2016-04-04 ENCOUNTER — Non-Acute Institutional Stay (SKILLED_NURSING_FACILITY): Payer: Medicare Other | Admitting: Internal Medicine

## 2016-04-04 DIAGNOSIS — M25522 Pain in left elbow: Secondary | ICD-10-CM

## 2016-04-04 DIAGNOSIS — M6281 Muscle weakness (generalized): Secondary | ICD-10-CM | POA: Diagnosis not present

## 2016-04-04 DIAGNOSIS — M25529 Pain in unspecified elbow: Secondary | ICD-10-CM | POA: Insufficient documentation

## 2016-04-04 NOTE — Progress Notes (Signed)
Progress Note    Location:  [facility] Room Number: N60A Place of Service:  SNF [id number]) Provider:  Jeanmarie Hubert, MD  Patient Care Team: Estill Dooms, MD as PCP - General (Internal Medicine) Laurence Spates, MD as Consulting Physician (Gastroenterology) Hennie Duos, MD as Consulting Physician (Rheumatology) Corliss Parish, MD as Consulting Physician (Nephrology) Jettie Booze, MD as Consulting Physician (Cardiology) Glenna Fellows, MD as Attending Physician (Neurosurgery) Susa Day, MD as Consulting Physician (Orthopedic Surgery) Man Otho Darner, NP as Nurse Practitioner (Internal Medicine)  Extended Emergency Contact Information Primary Emergency Contact: [facility] Address: [address]          [place], [place] [address] [name] of [place] Phone: [phone] Mobile Phone: [phone] Relation: Daughter Secondary Emergency Contact: Donnald Garre Address: [place], [place] of [place] Phone: [phone] Mobile Phone: [phone] Relation: Son  Code Status:  DNR Goals of care: Advanced Directive information Advanced Directives 04/04/2016  Does Patient Have a Medical Advance Directive? Yes  Type of Advance Directive Out of facility DNR (pink MOST or yellow form)  Does patient want to make changes to medical advance directive? -  Copy of [place] in Chart? -  Would patient like information on creating a medical advance directive? -  Pre-existing out of facility DNR order (yellow form or pink MOST form) Yellow form placed in chart (order not valid for inpatient use)     Chief Complaint  Patient presents with  . Acute Visit    Patient fell 04/03/16, today she complain to he nurse her left elbow hurts.     HPI:  Pt is a 83 y.o. female seen today for an acute visit for evaluation of new onset pain and swelling of the left elbow. There is bruising of the left elbow  posteriorly exting into the triceps area and down the forearm posteriorly. She fell and was seen at Urgent Care yesterday. Elbow was not a problem at that time.   Past Medical History:  Diagnosis Date  . Anxiety   . Aortic stenosis    a. Mild AS by echo 03/2013.  Marland Kitchen Arthritis   . Bilateral knee pain 06/10/2012  . Cardiomegaly 12/23/2014  . Cataract   . Chronic kidney disease    Stage III/IV  . Colitis, collagenous 07/11/2011  . Corticosteroid dependence ([place]) 12/17/2014  . DDD (degenerative disc disease), thoracolumbar 12/23/2014  . Debility 12/23/2014  . Diverticulitis   . Diverticulosis of colon without hemorrhage 12/23/2014  . Dysphagia 12/23/2014  . Edema 12/23/2014  . Effusion of left knee 12/23/2014  . Enteritis 12/17/2014  . Hiatal hernia 12/23/2014  . HNP (herniated nucleus pulposus), lumbar 12/23/2014   L 4-5   . Hyperlipidemia   . Hypertension   . Hypokalemia   . Hypothyroidism   . IBS (irritable bowel syndrome) 12/23/2014  . Irritable bowel   . Noninfected skin tear of leg 12/23/2014   Left leg   . Osteoarthritis of left knee 12/23/2014  . Osteoporosis   . PMR (polymyalgia rheumatica) (HCC)    a. On chronic prednisone.  . Polymyalgia rheumatica ([place]) 07/11/2011  . Premature atrial contractions    a. Noted during adm 03/2013, corresponding with patient's palpitations.  . Scoliosis (and kyphoscoliosis), idiopathic 12/23/2014  . Spinal stenosis   . Spinal stenosis of lumbar region 12/23/2014  . Symptomatic bradycardia    a. Adm 03/2013 - taken off  clonidine and BB.  . T11 vertebral fracture ([place]) 12/23/2014  . Unstable gait 12/23/2014   Past Surgical History:  Procedure Laterality Date  . ABDOMINAL HYSTERECTOMY  1966  . APPENDECTOMY    . BACK SURGERY    . Breast biopsies    . CHOLECYSTECTOMY    . EYE SURGERY      Allergies  Allergen Reactions  . Codeine Nausea And Vomiting  . Fosamax [Alendronate Sodium]     Unknown.   Dion Saucier [Calcitonin]     Unknown.  . Nsaids      Due to renal function.   . Quinolones Other (See Comments)    platelets  . Sulfa Antibiotics Nausea And Vomiting    Allergies as of 04/04/2016      Reactions   Codeine Nausea And Vomiting   Fosamax [alendronate Sodium]    Unknown.    Miacalcin [calcitonin]    Unknown.   Nsaids    Due to renal function.    Quinolones Other (See Comments)   platelets   Sulfa Antibiotics Nausea And Vomiting      Medication List       Accurate as of 04/04/16  9:58 AM. Always use your most recent med list.          ALPRAZolam 0.25 MG tablet Commonly known as:  XANAX Take 0.25 mg by mouth 4 (four) times daily.   amLODipine 5 MG tablet Commonly known as:  NORVASC Take 5 mg by mouth daily.   furosemide 40 MG tablet Commonly known as:  LASIX Take 40 mg by mouth every morning.   hydrALAZINE 50 MG tablet Commonly known as:  APRESOLINE Take 1 tablet (50 mg total) by mouth 3 (three) times daily. (Every 8 hours)   HYDROcodone-acetaminophen 10-325 MG tablet Commonly known as:  NORCO Take 1 tablet by mouth every 6 (six) hours as needed.   hydrocortisone cream 1 % Apply 1 application topically. Apply to inflamed anal area twice a day until healed   levothyroxine 50 MCG tablet Commonly known as:  SYNTHROID, LEVOTHROID Take 50 mcg by mouth daily.   omeprazole 20 MG capsule Commonly known as:  PRILOSEC Take 20 mg by mouth daily.   potassium chloride SA 20 MEQ tablet Commonly known as:  K-DUR,KLOR-CON Take 1 tablet (20 mEq total) by mouth 2 (two) times daily.   predniSONE 1 MG tablet Commonly known as:  DELTASONE Take 1 mg by mouth daily with breakfast. Take with prednisone 5mg    predniSONE 5 MG tablet Commonly known as:  DELTASONE Take 5 mg by mouth every morning. Take with prednisone 1 mg   sertraline 50 MG tablet Commonly known as:  ZOLOFT Take 50 mg by mouth daily. Take with 25 mg   sertraline 25 MG tablet Commonly known as:  ZOLOFT Take 25 mg by mouth at bedtime.     Vitamin D 2000 units Caps Take 2,000 Units by mouth daily.       Review of Systems  Constitutional: Negative for activity change, appetite change, chills, diaphoresis, fatigue, fever and unexpected weight change.  HENT: Negative for congestion, ear discharge, ear pain, hearing loss, postnasal drip, rhinorrhea, sore throat, tinnitus, trouble swallowing and voice change.        Folliculitis right nostril, mild cellulitis in the area  Eyes: Positive for visual disturbance (corrective lenses). Negative for pain, redness and itching.  Respiratory: Negative for cough, choking, shortness of breath and wheezing.   Cardiovascular: Positive for leg swelling. Negative for chest pain  and palpitations.  Gastrointestinal: Negative for abdominal distention, abdominal pain, constipation, diarrhea and nausea.  Endocrine: Negative for cold intolerance, heat intolerance, polydipsia, polyphagia and polyuria.  Genitourinary: Negative for difficulty urinating, dysuria, flank pain, frequency, hematuria, pelvic pain, urgency and vaginal discharge.  Musculoskeletal: Positive for arthralgias, back pain and joint swelling (left hand and wrist. Both knees. Left elbow with bruising and restriction of movement.). Negative for gait problem, myalgias, neck pain and neck stiffness.       Fx left radius at wrist-healed [place] 04/03/16, stated her walker rolled away from her, landed on the left side, c/o left hip and ankle pain, able to bear weight, no deformity noted, she takes Norco 10/325mg  q6h.   Skin: Positive for wound (Lateral right lower leg open wound, no s/s of infection. Distal right forearm skin tear, proximate with steri strips, healing nicely.). Negative for color change, pallor and rash.  Allergic/Immunologic: Negative.   Neurological: Positive for weakness and numbness (left hand). Negative for dizziness, tremors, seizures, syncope and headaches.  Hematological: Negative for adenopathy. Does not bruise/bleed  easily.  Psychiatric/Behavioral: Positive for dysphoric mood. Negative for agitation, behavioral problems, confusion, hallucinations, sleep disturbance and suicidal ideas. The patient is not nervous/anxious and is not hyperactive.     Immunization History  Administered Date(s) Administered  . Influenza-Unspecified 11/01/2013  . Pneumococcal Polysaccharide-23 03/22/2004  . Tdap 11/21/2011  . Zoster 06/22/2009   Pertinent  Health Maintenance Due  Topic Date Due  . DEXA SCAN  05/07/1984  . PNA vac Low Risk Adult (2 of 2 - PCV13) 03/22/2005  . INFLUENZA VACCINE  08/15/2015   Fall Risk  01/17/2016  Falls in the past year? Yes  Number falls in past yr: 2 or more    Vitals:   04/04/16 0951  BP: 136/74  Pulse: 76  Resp: 20  Temp: 97.1 F (36.2 C)  Weight: 131 lb 11.2 oz (59.7 kg)  Height: 5\' 4"  (1.626 m)   Body mass index is 22.61 kg/m. Physical Exam  Labs reviewed:  Recent Labs  01/17/16 1440 02/13/16  NA 141 139  K 4.1 3.7  CL 104  --   CO2 28  --   GLUCOSE 119*  --   BUN 36* 33*  CREATININE 1.47* 1.4*  CALCIUM 9.1  --     Recent Labs  02/13/16  AST 14  ALT 9  ALKPHOS 75    Recent Labs  01/17/16 1440 02/13/16  WBC 9.9 7.2  NEUTROABS 8.1*  --   HGB 11.9* 11.4*  HCT 36.8 35*  MCV 87.0  --   PLT 199 144*   Lab Results  Component Value Date   TSH 3.53 02/13/2016    Assessment/Plan 1. Left elbow pain -Portable xray of the left elbow to rule out fracture

## 2016-04-05 DIAGNOSIS — M25522 Pain in left elbow: Secondary | ICD-10-CM | POA: Diagnosis not present

## 2016-04-08 ENCOUNTER — Non-Acute Institutional Stay (SKILLED_NURSING_FACILITY): Payer: Medicare Other | Admitting: Nurse Practitioner

## 2016-04-08 ENCOUNTER — Encounter: Payer: Self-pay | Admitting: Nurse Practitioner

## 2016-04-08 DIAGNOSIS — I10 Essential (primary) hypertension: Secondary | ICD-10-CM

## 2016-04-08 DIAGNOSIS — M25522 Pain in left elbow: Secondary | ICD-10-CM

## 2016-04-08 DIAGNOSIS — N183 Chronic kidney disease, stage 3 unspecified: Secondary | ICD-10-CM

## 2016-04-08 DIAGNOSIS — F418 Other specified anxiety disorders: Secondary | ICD-10-CM | POA: Diagnosis not present

## 2016-04-08 DIAGNOSIS — E039 Hypothyroidism, unspecified: Secondary | ICD-10-CM | POA: Diagnosis not present

## 2016-04-08 DIAGNOSIS — M353 Polymyalgia rheumatica: Secondary | ICD-10-CM | POA: Diagnosis not present

## 2016-04-08 DIAGNOSIS — R609 Edema, unspecified: Secondary | ICD-10-CM

## 2016-04-08 DIAGNOSIS — S81801D Unspecified open wound, right lower leg, subsequent encounter: Secondary | ICD-10-CM

## 2016-04-08 DIAGNOSIS — K449 Diaphragmatic hernia without obstruction or gangrene: Secondary | ICD-10-CM

## 2016-04-08 DIAGNOSIS — M79632 Pain in left forearm: Secondary | ICD-10-CM | POA: Diagnosis not present

## 2016-04-08 DIAGNOSIS — S52502A Unspecified fracture of the lower end of left radius, initial encounter for closed fracture: Secondary | ICD-10-CM

## 2016-04-08 NOTE — Assessment & Plan Note (Signed)
Controlled, continue Amlodipine 50mg , Hydralazi ne 50mg  tid, Furosemide 40mg  daily. 02/13/16 wbc 7.2, Hgb 11.4, plt 144, Na 139, K 3.7, bun 33, creat 1.4, TSH 3.53

## 2016-04-08 NOTE — Assessment & Plan Note (Signed)
04/04/16 X-ray no acute fx or dislocation.  04/08/16 left left elbow swelling, a large bruise, pain with ROM, repeat X-ray left elbow, distal left humerus, and left forearm 3 views

## 2016-04-08 NOTE — Assessment & Plan Note (Signed)
Stable, continue Omeprazole 20mg daily.  

## 2016-04-08 NOTE — Assessment & Plan Note (Signed)
Trace edema BLE, continue Furosemide 40mg 

## 2016-04-08 NOTE — Assessment & Plan Note (Signed)
Continue Levothyroxine 48mcg, TSH 3.53 02/13/16

## 2016-04-08 NOTE — Assessment & Plan Note (Signed)
Creat 1.4 02/13/16

## 2016-04-08 NOTE — Assessment & Plan Note (Signed)
Healing slowly, no s/s of infection presently.

## 2016-04-08 NOTE — Assessment & Plan Note (Signed)
healed 

## 2016-04-08 NOTE — Assessment & Plan Note (Signed)
Continue Prednisone 1mg  with breakfast, 5mg  at 10am.

## 2016-04-08 NOTE — Assessment & Plan Note (Signed)
Stable, continue Alprazolam 0.25mg  qid (pharm recommendation tid from qid), Sertraline 75mg  daily

## 2016-04-08 NOTE — Progress Notes (Signed)
Location:  [facility] Room Number: 49 Place of Service:  SNF (31) Provider:  Clarie Camey, Manxie  NP  Jeanmarie Hubert, MD  Patient Care Team: Estill Dooms, MD as PCP - General (Internal Medicine) Laurence Spates, MD as Consulting Physician (Gastroenterology) Hennie Duos, MD as Consulting Physician (Rheumatology) Corliss Parish, MD as Consulting Physician (Nephrology) Jettie Booze, MD as Consulting Physician (Cardiology) Glenna Fellows, MD as Attending Physician (Neurosurgery) Susa Day, MD as Consulting Physician (Orthopedic Surgery) Hallelujah Wysong Otho Darner, NP as Nurse Practitioner (Internal Medicine)  Extended Emergency Contact Information Primary Emergency Contact: [facility] Address: [address]          [place], [place] [address] [name] of [address] Phone: [phone] Mobile Phone: [phone] Relation: Daughter Secondary Emergency Contact: Donnald Garre Address: [place], [place] of [place] Phone: [phone] Mobile Phone: [phone] Relation: Son  Code Status:  DNR Goals of care: Advanced Directive information Advanced Directives 04/08/2016  Does Patient Have a Medical Advance Directive? Yes  Type of Advance Directive Out of facility DNR (pink MOST or yellow form)  Does patient want to make changes to medical advance directive? No - Patient declined  Copy of [place] in Chart? -  Would patient like information on creating a medical advance directive? -  Pre-existing out of facility DNR order (yellow form or pink MOST form) Yellow form placed in chart (order not valid for inpatient use)     Chief Complaint  Patient presents with  . Medical Management of Chronic Issues    (L ) has large bruise and pocket    HPI:  Pt is a 83 y.o. female seen today to evaluate  The left elbow swelling, pain with ROM, a large bruise. X-ray 04/04/16 left elbow showed no fx.     [place] 04/03/16,  stated her walker rolled away from her, landed on the left side, c/o left hip and ankle pain, able to bear weight, no deformity noted, she takes Norco 10/325mg  q6h.                           Lateral right lower leg open wound, no s/s of infection. Distal right forearm skin tear, proximate with steri strips, healing nicely.                           ED 03/30/16 distal right forearm skin tear, proximate with Steri strips.               ED 01/18/16, The radial fx healed               Hx of HTN, controlled, taking Amlodipine, Hydralazine, Furosemide. Mood is managed with Alprazolam, Sertraline. Thyroid, taking Levothyroxine, last TSH 4.40 12/29/14, 3.53 02/13/16                  Past Medical History:  Diagnosis Date  . Anxiety   . Aortic stenosis    a. Mild AS by echo 03/2013.  Marland Kitchen Arthritis   . Bilateral knee pain 06/10/2012  . Cardiomegaly 12/23/2014  . Cataract   . Chronic kidney disease    Stage III/IV  . Colitis, collagenous 07/11/2011  . Corticosteroid dependence ([place]) 12/17/2014  . DDD (degenerative disc disease), thoracolumbar 12/23/2014  . Debility 12/23/2014  . Diverticulitis   . Diverticulosis of colon without hemorrhage  12/23/2014  . Dysphagia 12/23/2014  . Edema 12/23/2014  . Effusion of left knee 12/23/2014  . Enteritis 12/17/2014  . Hiatal hernia 12/23/2014  . HNP (herniated nucleus pulposus), lumbar 12/23/2014   L 4-5   . Hyperlipidemia   . Hypertension   . Hypokalemia   . Hypothyroidism   . IBS (irritable bowel syndrome) 12/23/2014  . Irritable bowel   . Noninfected skin tear of leg 12/23/2014   Left leg   . Osteoarthritis of left knee 12/23/2014  . Osteoporosis   . PMR (polymyalgia rheumatica) (HCC)    a. On chronic prednisone.  . Polymyalgia rheumatica ([place]) 07/11/2011  . Premature atrial contractions    a. Noted during adm 03/2013, corresponding with patient's palpitations.  . Scoliosis (and kyphoscoliosis), idiopathic 12/23/2014  . Spinal stenosis   . Spinal  stenosis of lumbar region 12/23/2014  . Symptomatic bradycardia    a. Adm 03/2013 - taken off clonidine and BB.  . T11 vertebral fracture ([place]) 12/23/2014  . Unstable gait 12/23/2014   Past Surgical History:  Procedure Laterality Date  . ABDOMINAL HYSTERECTOMY  1966  . APPENDECTOMY    . BACK SURGERY    . Breast biopsies    . CHOLECYSTECTOMY    . EYE SURGERY      Allergies  Allergen Reactions  . Codeine Nausea And Vomiting  . Fosamax [Alendronate Sodium]     Unknown.   Dion Saucier [Calcitonin]     Unknown.  . Nsaids     Due to renal function.   . Quinolones Other (See Comments)    platelets  . Sulfa Antibiotics Nausea And Vomiting    Allergies as of 04/08/2016      Reactions   Codeine Nausea And Vomiting   Fosamax [alendronate Sodium]    Unknown.    Miacalcin [calcitonin]    Unknown.   Nsaids    Due to renal function.    Quinolones Other (See Comments)   platelets   Sulfa Antibiotics Nausea And Vomiting      Medication List       Accurate as of 04/08/16  5:48 PM. Always use your most recent med list.          ALPRAZolam 0.25 MG tablet Commonly known as:  XANAX Take 0.25 mg by mouth 4 (four) times daily.   amLODipine 5 MG tablet Commonly known as:  NORVASC Take 5 mg by mouth daily.   furosemide 40 MG tablet Commonly known as:  LASIX Take 40 mg by mouth every morning.   hydrALAZINE 50 MG tablet Commonly known as:  APRESOLINE Take 1 tablet (50 mg total) by mouth 3 (three) times daily. (Every 8 hours)   HYDROcodone-acetaminophen 10-325 MG tablet Commonly known as:  NORCO Take 1 tablet by mouth every 6 (six) hours as needed.   hydrocortisone cream 1 % Apply 1 application topically. Apply to inflamed anal area twice a day until healed   levothyroxine 50 MCG tablet Commonly known as:  SYNTHROID, LEVOTHROID Take 50 mcg by mouth daily.   omeprazole 20 MG capsule Commonly known as:  PRILOSEC Take 20 mg by mouth daily.   potassium chloride SA 20 MEQ  tablet Commonly known as:  K-DUR,KLOR-CON Take 1 tablet (20 mEq total) by mouth 2 (two) times daily.   predniSONE 1 MG tablet Commonly known as:  DELTASONE Take 1 mg by mouth daily with breakfast. Take with prednisone 5mg    predniSONE 5 MG tablet Commonly known as:  DELTASONE Take 5 mg by  mouth every morning. Take with prednisone 1 mg   sertraline 50 MG tablet Commonly known as:  ZOLOFT Take 50 mg by mouth daily. Take with 25 mg   sertraline 25 MG tablet Commonly known as:  ZOLOFT Take 25 mg by mouth at bedtime.   Vitamin D 2000 units Caps Take 2,000 Units by mouth daily.       Review of Systems  Constitutional: Negative for activity change, appetite change, chills, diaphoresis, fatigue, fever and unexpected weight change.  HENT: Negative for congestion, ear discharge, ear pain, hearing loss, postnasal drip, rhinorrhea, sore throat, tinnitus, trouble swallowing and voice change.        Folliculitis right nostril, mild cellulitis in the area  Eyes: Positive for visual disturbance (corrective lenses). Negative for pain, redness and itching.  Respiratory: Negative for cough, choking, shortness of breath and wheezing.   Cardiovascular: Positive for leg swelling. Negative for chest pain and palpitations.  Gastrointestinal: Negative for abdominal distention, abdominal pain, constipation, diarrhea and nausea.  Endocrine: Negative for cold intolerance, heat intolerance, polydipsia, polyphagia and polyuria.  Genitourinary: Negative for difficulty urinating, dysuria, flank pain, frequency, hematuria, pelvic pain, urgency and vaginal discharge.  Musculoskeletal: Positive for arthralgias, back pain and joint swelling (left hand and wrist. Both knees. Left elbow with bruising and restriction of movement.). Negative for gait problem, myalgias, neck pain and neck stiffness.       Fx left radius at wrist-healed [place] 04/03/16, stated her walker rolled away from her, landed on the left side, c/o  left hip and ankle pain, able to bear weight, no deformity noted, she takes Norco 10/325mg  q6h.  Left elbow bruise, swelling, pain with ROM  Skin: Positive for wound (Lateral right lower leg open wound, no s/s of infection. Distal right forearm skin tear, proximate with steri strips, healing nicely.). Negative for color change, pallor and rash.       Right lower leg  Allergic/Immunologic: Negative.   Neurological: Positive for weakness and numbness (left hand). Negative for dizziness, tremors, seizures, syncope and headaches.  Hematological: Negative for adenopathy. Does not bruise/bleed easily.  Psychiatric/Behavioral: Positive for dysphoric mood. Negative for agitation, behavioral problems, confusion, hallucinations, sleep disturbance and suicidal ideas. The patient is not nervous/anxious and is not hyperactive.     Immunization History  Administered Date(s) Administered  . Influenza-Unspecified 11/01/2013  . Pneumococcal Polysaccharide-23 03/22/2004  . Tdap 11/21/2011  . Zoster 06/22/2009   Pertinent  Health Maintenance Due  Topic Date Due  . DEXA SCAN  05/07/1984  . PNA vac Low Risk Adult (2 of 2 - PCV13) 03/22/2005  . INFLUENZA VACCINE  08/15/2015   Fall Risk  01/17/2016  Falls in the past year? Yes  Number falls in past yr: 2 or more   Functional Status Survey:    Vitals:   04/08/16 1648  BP: 136/74  Pulse: 76  Resp: 20  Temp: 97.4 F (36.3 C)  Weight: 131 lb 11.2 oz (59.7 kg)  Height: 5\' 4"  (1.626 m)   Body mass index is 22.61 kg/m. Physical Exam  Constitutional: She is oriented to person, place, and time. She appears well-developed and well-nourished. No distress.  Overweight  HENT:  Right Ear: External ear normal.  Left Ear: External ear normal.  Nose: Nose normal.  Mouth/Throat: Oropharynx is clear and moist. No oropharyngeal exudate.  Eyes: Conjunctivae and EOM are normal. Pupils are equal, round, and reactive to light. No scleral icterus.  Neck: No JVD  present. No tracheal deviation present. No thyromegaly  present.  Cardiovascular: Normal rate, regular rhythm and intact distal pulses.  Exam reveals no gallop and no friction rub.   Murmur heard. Systolic murmur 3-2/6 the right sternal border.   Pulmonary/Chest: Effort normal. No respiratory distress. She has no wheezes. She has no rales. She exhibits no tenderness.  Abdominal: She exhibits no distension and no mass. There is no tenderness.  Genitourinary:  Genitourinary Comments: External hemorrhoids.   Musculoskeletal: Normal range of motion. She exhibits edema and tenderness.  Generalized weakness. Left knee effusion. Pain left knee>right knee, lower back, left wrist.  RLE trace edema. c/o left hip and ankle pain 04/03/16 since the fall in am, able to bear weight, no noted deformity.   Lymphadenopathy:    She has no cervical adenopathy.  Neurological: She is alert and oriented to person, place, and time. No cranial nerve deficit. Coordination normal.  Skin: No rash noted. She is not diaphoretic. No erythema. No pallor.  Lateral right lower leg open wound, no s/s of infection. Distal right forearm skin tear, proximate with steri strips, healing nicely.  Left elbow swelling, pain with ROM, a large bruise.    Psychiatric: Her behavior is normal. Judgment and thought content normal.  Frustrated and depressed about her current situation.    Labs reviewed:  Recent Labs  01/17/16 1440 02/13/16  NA 141 139  K 4.1 3.7  CL 104  --   CO2 28  --   GLUCOSE 119*  --   BUN 36* 33*  CREATININE 1.47* 1.4*  CALCIUM 9.1  --     Recent Labs  02/13/16  AST 14  ALT 9  ALKPHOS 75    Recent Labs  01/17/16 1440 02/13/16  WBC 9.9 7.2  NEUTROABS 8.1*  --   HGB 11.9* 11.4*  HCT 36.8 35*  MCV 87.0  --   PLT 199 144*   Lab Results  Component Value Date   TSH 3.53 02/13/2016   No results found for: HGBA1C No results found for: CHOL, HDL, LDLCALC, LDLDIRECT, TRIG, CHOLHDL  Significant  Diagnostic Results in last 30 days:  No results found.  Assessment/Plan Painful elbow 04/04/16 X-ray no acute fx or dislocation.  04/08/16 left left elbow swelling, a large bruise, pain with ROM, repeat X-ray left elbow, distal left humerus, and left forearm 3 views   HTN (hypertension) Controlled, continue Amlodipine 50mg , Hydralazi ne 50mg  tid, Furosemide 40mg  daily. 02/13/16 wbc 7.2, Hgb 11.4, plt 144, Na 139, K 3.7, bun 33, creat 1.4, TSH 3.53    Hiatal hernia Stable, continue Omeprazole 20mg  daily.   Hypothyroidism Continue Levothyroxine 52mcg, TSH 3.53 02/13/16  Fracture of distal end of left radius healed  Chronic kidney disease, stage III/IV Creat 1.4 02/13/16  Polymyalgia rheumatica Continue Prednisone 1mg  with breakfast, 5mg  at 10am.   Edema Trace edema BLE, continue Furosemide 40mg   Depression with anxiety Stable, continue Alprazolam 0.25mg  qid (pharm recommendation tid from qid), Sertraline 75mg  daily  Traumatic open wound of right lower leg Healing slowly, no s/s of infection presently.       Family/ staff Communication: SNF  Labs/tests ordered:  X-ray left forearm, elbow, distal left humerus AP, lateral, oblique views.

## 2016-04-15 ENCOUNTER — Encounter: Payer: Self-pay | Admitting: Nurse Practitioner

## 2016-04-15 ENCOUNTER — Non-Acute Institutional Stay (SKILLED_NURSING_FACILITY): Payer: Medicare Other | Admitting: Nurse Practitioner

## 2016-04-15 DIAGNOSIS — M150 Primary generalized (osteo)arthritis: Secondary | ICD-10-CM

## 2016-04-15 DIAGNOSIS — K449 Diaphragmatic hernia without obstruction or gangrene: Secondary | ICD-10-CM | POA: Diagnosis not present

## 2016-04-15 DIAGNOSIS — M25522 Pain in left elbow: Secondary | ICD-10-CM

## 2016-04-15 DIAGNOSIS — N183 Chronic kidney disease, stage 3 unspecified: Secondary | ICD-10-CM

## 2016-04-15 DIAGNOSIS — R609 Edema, unspecified: Secondary | ICD-10-CM | POA: Diagnosis not present

## 2016-04-15 DIAGNOSIS — M353 Polymyalgia rheumatica: Secondary | ICD-10-CM

## 2016-04-15 DIAGNOSIS — F418 Other specified anxiety disorders: Secondary | ICD-10-CM | POA: Diagnosis not present

## 2016-04-15 DIAGNOSIS — K219 Gastro-esophageal reflux disease without esophagitis: Secondary | ICD-10-CM | POA: Diagnosis not present

## 2016-04-15 DIAGNOSIS — E039 Hypothyroidism, unspecified: Secondary | ICD-10-CM

## 2016-04-15 DIAGNOSIS — M1712 Unilateral primary osteoarthritis, left knee: Secondary | ICD-10-CM

## 2016-04-15 DIAGNOSIS — M159 Polyosteoarthritis, unspecified: Secondary | ICD-10-CM

## 2016-04-15 DIAGNOSIS — I10 Essential (primary) hypertension: Secondary | ICD-10-CM

## 2016-04-15 NOTE — Assessment & Plan Note (Signed)
Creat 1.4 02/13/16

## 2016-04-15 NOTE — Assessment & Plan Note (Signed)
Continue Levothyroxine 19mcg, TSH 3.53 02/13/16

## 2016-04-15 NOTE — Progress Notes (Signed)
Location:  [facility] Room Number: 20 Place of Service:  SNF (31) Provider:  Mast, Manxie  NP  Jeanmarie Hubert, MD  Patient Care Team: Estill Dooms, MD as PCP - General (Internal Medicine) Laurence Spates, MD as Consulting Physician (Gastroenterology) Hennie Duos, MD as Consulting Physician (Rheumatology) Corliss Parish, MD as Consulting Physician (Nephrology) Jettie Booze, MD as Consulting Physician (Cardiology) Glenna Fellows, MD as Attending Physician (Neurosurgery) Susa Day, MD as Consulting Physician (Orthopedic Surgery) Man Otho Darner, NP as Nurse Practitioner (Internal Medicine)  Extended Emergency Contact Information Primary Emergency Contact: [facility] Address: [address]          [place], [place] [address] [name] Phone: [phone] Mobile Phone: [phone] Relation: Daughter Secondary Emergency Contact: Donnald Garre Address: [place], [place] Phone: [phone] Mobile Phone: [phone] Relation: Son  Code Status:  DNR Goals of care: Advanced Directive information Advanced Directives 04/15/2016  Does Patient Have a Medical Advance Directive? Yes  Type of Advance Directive Out of facility DNR (pink MOST or yellow form)  Does patient want to make changes to medical advance directive? No - Patient declined  Copy of [place] in Chart? -  Would patient like information on creating a medical advance directive? -  Pre-existing out of facility DNR order (yellow form or pink MOST form) Yellow form placed in chart (order not valid for inpatient use)     Chief Complaint  Patient presents with  . Acute Visit    complaining of left leg pain from fall last week.    HPI:  Pt is a 83 y.o. female seen today for an acute visit for 04/15/16 c/o lateral left knee pain, worse with weight bearing. Last fall 04/03/16. Hx of left knee effusion, no  bruise, warmth, swelling noted.   Hx of HTN, controlled, taking Amlodipine 5mg  qd, Hydralazine 50mg  tid, Furosemide 40mg  qd Mood is managed with Alprazolam, Sertraline 75mg  qd. Thyroid, taking Levothyroxine 97mcg qd, last TSH 3.53 02/13/16. Polymyalgia Rheumatic, chronic Prednisone use.   Past Medical History:  Diagnosis Date  . Anxiety   . Aortic stenosis    a. Mild AS by echo 03/2013.  Marland Kitchen Arthritis   . Bilateral knee pain 06/10/2012  . Cardiomegaly 12/23/2014  . Cataract   . Chronic kidney disease    Stage III/IV  . Colitis, collagenous 07/11/2011  . Corticosteroid dependence ([place]) 12/17/2014  . DDD (degenerative disc disease), thoracolumbar 12/23/2014  . Debility 12/23/2014  . Diverticulitis   . Diverticulosis of colon without hemorrhage 12/23/2014  . Dysphagia 12/23/2014  . Edema 12/23/2014  . Effusion of left knee 12/23/2014  . Enteritis 12/17/2014  . Hiatal hernia 12/23/2014  . HNP (herniated nucleus pulposus), lumbar 12/23/2014   L 4-5   . Hyperlipidemia   . Hypertension   . Hypokalemia   . Hypothyroidism   . IBS (irritable bowel syndrome) 12/23/2014  . Irritable bowel   . Noninfected skin tear of leg 12/23/2014   Left leg   . Osteoarthritis of left knee 12/23/2014  . Osteoporosis   . PMR (polymyalgia rheumatica) ([facility])    a. On chronic prednisone.  . Polymyalgia rheumatica ([place]) 07/11/2011  . Premature atrial contractions    a. Noted during adm 03/2013, corresponding with patient's palpitations.  . Scoliosis (and kyphoscoliosis), idiopathic 12/23/2014  . Spinal stenosis   . Spinal stenosis of lumbar region 12/23/2014  .  Symptomatic bradycardia    a. Adm 03/2013 - taken off clonidine and BB.  . T11 vertebral fracture ([place]) 12/23/2014  . Unstable gait 12/23/2014   Past Surgical History:  Procedure Laterality Date  . ABDOMINAL HYSTERECTOMY  1966  . APPENDECTOMY    . BACK SURGERY    . Breast biopsies    . CHOLECYSTECTOMY    . EYE SURGERY      Allergies  Allergen Reactions  .  Codeine Nausea And Vomiting  . Fosamax [Alendronate Sodium]     Unknown.   Dion Saucier [Calcitonin]     Unknown.  . Nsaids     Due to renal function.   . Quinolones Other (See Comments)    platelets  . Sulfa Antibiotics Nausea And Vomiting    Allergies as of 04/15/2016      Reactions   Codeine Nausea And Vomiting   Fosamax [alendronate Sodium]    Unknown.    Miacalcin [calcitonin]    Unknown.   Nsaids    Due to renal function.    Quinolones Other (See Comments)   platelets   Sulfa Antibiotics Nausea And Vomiting      Medication List       Accurate as of 04/15/16  1:17 PM. Always use your most recent med list.          ALPRAZolam 0.25 MG tablet Commonly known as:  XANAX Take 0.25 mg by mouth 4 (four) times daily.   amLODipine 5 MG tablet Commonly known as:  NORVASC Take 5 mg by mouth daily.   furosemide 40 MG tablet Commonly known as:  LASIX Take 40 mg by mouth every morning.   hydrALAZINE 50 MG tablet Commonly known as:  APRESOLINE Take 1 tablet (50 mg total) by mouth 3 (three) times daily. (Every 8 hours)   HYDROcodone-acetaminophen 10-325 MG tablet Commonly known as:  NORCO Take 1 tablet by mouth every 6 (six) hours as needed.   hydrocortisone cream 1 % Apply 1 application topically. Apply to inflamed anal area twice a day until healed   levothyroxine 50 MCG tablet Commonly known as:  SYNTHROID, LEVOTHROID Take 50 mcg by mouth daily.   omeprazole 20 MG capsule Commonly known as:  PRILOSEC Take 20 mg by mouth daily.   potassium chloride SA 20 MEQ tablet Commonly known as:  K-DUR,KLOR-CON Take 1 tablet (20 mEq total) by mouth 2 (two) times daily.   predniSONE 1 MG tablet Commonly known as:  DELTASONE Take 1 mg by mouth daily with breakfast. Take with prednisone 5mg    predniSONE 5 MG tablet Commonly known as:  DELTASONE Take 5 mg by mouth every morning. Take with prednisone 1 mg   sertraline 50 MG tablet Commonly known as:  ZOLOFT Take 50 mg  by mouth daily. Take with 25 mg   sertraline 25 MG tablet Commonly known as:  ZOLOFT Take 25 mg by mouth at bedtime.   silver sulfADIAZINE 1 % cream Commonly known as:  SILVADENE Apply 1 application topically 2 (two) times daily. To the right leg wound.   traMADol 50 MG tablet Commonly known as:  ULTRAM Take 50 mg by mouth every 4 (four) hours as needed for moderate pain or severe pain.   Vitamin D 2000 units Caps Take 2,000 Units by mouth daily.       Review of Systems  Constitutional: Negative for activity change, appetite change, chills, diaphoresis, fatigue, fever and unexpected weight change.  HENT: Negative for congestion, ear discharge, ear pain, hearing  loss, postnasal drip, rhinorrhea, sore throat, tinnitus, trouble swallowing and voice change.        Folliculitis right nostril, mild cellulitis in the area  Eyes: Positive for visual disturbance (corrective lenses). Negative for pain, redness and itching.  Respiratory: Negative for cough, choking, shortness of breath and wheezing.   Cardiovascular: Positive for leg swelling. Negative for chest pain and palpitations.  Gastrointestinal: Negative for abdominal distention, abdominal pain, constipation, diarrhea and nausea.  Endocrine: Negative for cold intolerance, heat intolerance, polydipsia, polyphagia and polyuria.  Genitourinary: Negative for difficulty urinating, dysuria, flank pain, frequency, hematuria, pelvic pain, urgency and vaginal discharge.  Musculoskeletal: Positive for arthralgias, back pain and joint swelling (left hand and wrist. Both knees. Left elbow with bruising and restriction of movement.). Negative for gait problem, myalgias, neck pain and neck stiffness.       Fx left radius at wrist-healed [place] 04/03/16, stated her walker rolled away from her, landed on the left side, c/o left hip and ankle pain, able to bear weight, no deformity noted, she takes Norco 10/325mg  q6h.  Left elbow bruise, swelling, pain  with ROM 04/15/16 c/o lateral left knee pain, worse with weight bearing.   Skin: Positive for wound (Lateral right lower leg open wound, no s/s of infection. Distal right forearm skin tear, proximate with steri strips, healing nicely.). Negative for color change, pallor and rash.       Right lower leg  Allergic/Immunologic: Negative.   Neurological: Positive for weakness and numbness (left hand). Negative for dizziness, tremors, seizures, syncope and headaches.  Hematological: Negative for adenopathy. Does not bruise/bleed easily.  Psychiatric/Behavioral: Positive for dysphoric mood. Negative for agitation, behavioral problems, confusion, hallucinations, sleep disturbance and suicidal ideas. The patient is not nervous/anxious and is not hyperactive.     Immunization History  Administered Date(s) Administered  . Influenza-Unspecified 11/01/2013  . Pneumococcal Polysaccharide-23 03/22/2004  . Tdap 11/21/2011  . Zoster 06/22/2009   Pertinent  Health Maintenance Due  Topic Date Due  . DEXA SCAN  05/07/1984  . PNA vac Low Risk Adult (2 of 2 - PCV13) 03/22/2005  . INFLUENZA VACCINE  08/14/2016   Fall Risk  01/17/2016  Falls in the past year? Yes  Number falls in past yr: 2 or more   Functional Status Survey:    Vitals:   04/15/16 1047  BP: 120/70  Pulse: 64  Resp: 18  Temp: 97.5 F (36.4 C)  Weight: 129 lb 12.8 oz (58.9 kg)  Height: 5\' 4"  (1.626 m)   Body mass index is 22.28 kg/m. Physical Exam  Labs reviewed:  Recent Labs  01/17/16 1440 02/13/16  NA 141 139  K 4.1 3.7  CL 104  --   CO2 28  --   GLUCOSE 119*  --   BUN 36* 33*  CREATININE 1.47* 1.4*  CALCIUM 9.1  --     Recent Labs  02/13/16  AST 14  ALT 9  ALKPHOS 75    Recent Labs  01/17/16 1440 02/13/16  WBC 9.9 7.2  NEUTROABS 8.1*  --   HGB 11.9* 11.4*  HCT 36.8 35*  MCV 87.0  --   PLT 199 144*   Lab Results  Component Value Date   TSH 3.53 02/13/2016   No results found for: HGBA1C No results  found for: CHOL, HDL, LDLCALC, LDLDIRECT, TRIG, CHOLHDL  Significant Diagnostic Results in last 30 days:  No results found.  Assessment/Plan Osteoarthritis of left knee [place] 04/03/16, c/o left hip and ankle pain, able  to bear weight, no noted deformity, taking Norco 10/325mg  q6 for OA left knee, left wrist pain, back pain already, 04/03/16  X-ray left hip and ankle, no acute fxs, observe. 04/15/16 c/o lateral left knee pain, worse with weight bearing, no warmth, swelling, redness/bruise observed. Obtain X-ray left knee to r/o fx  Osteoarthritis, multiple sites 04/03/16 X-ray L hip/ankle, no fxs. [place] 04/03/16, c/o left hip and ankle pain, able to bear weight, no noted deformity, taking Norco 10/325mg  q6 for OA left knee, left wrist pain, back pain already,  HTN (hypertension) Controlled, continue Amlodipine 50mg , Hydralazi ne 50mg  tid, Furosemide 40mg  daily.  Hiatal hernia Stable, continue Omeprazole 20mg  daily.   Hypothyroidism Continue Levothyroxine 55mcg, TSH 3.53 02/13/16  Chronic kidney disease, stage III/IV Creat 1.4 02/13/16  Polymyalgia rheumatica Continue Prednisone 1mg  with breakfast, 5mg  at 10am.   Edema Trace edema BLE, L>R, continue Furosemide 40mg   Depression with anxiety Stable, continue Alprazolam 0.25mg  qid (pharm recommendation tid from qid), Sertraline 75mg  daily  Painful elbow 04/04/16 X-ray no acute fx or dislocation.  04/08/16 left left elbow swelling, a large bruise, pain with ROM, repeat X-ray left elbow, distal left humerus, and left forearm.  04/08/16 X-ray left forearm, elbow, humerus, no fxs3/22/18 X-ray no acute fx or dislocation.  04/08/16 left left elbow swelling, a large bruise, pain with ROM, repeat X-ray left elbow, distal left humerus, and left forearm 3 views 04/15/16 improving.    GERD (gastroesophageal reflux disease) Stable, continue Omeprazole 20mg  daily.      Family/ staff Communication: [place] vs [place] when able.   Labs/tests ordered:  X-ray left  knee

## 2016-04-15 NOTE — Assessment & Plan Note (Signed)
Stable, continue Omeprazole 20mg daily.  

## 2016-04-15 NOTE — Assessment & Plan Note (Signed)
Stable, continue Alprazolam 0.25mg  qid (pharm recommendation tid from qid), Sertraline 75mg  daily

## 2016-04-15 NOTE — Assessment & Plan Note (Signed)
Continue Prednisone 1mg  with breakfast, 5mg  at 10am.

## 2016-04-15 NOTE — Assessment & Plan Note (Addendum)
04/04/16 X-ray no acute fx or dislocation.  04/08/16 left left elbow swelling, a large bruise, pain with ROM, repeat X-ray left elbow, distal left humerus, and left forearm.  04/08/16 X-ray left forearm, elbow, humerus, no fxs3/22/18 X-ray no acute fx or dislocation.  04/08/16 left left elbow swelling, a large bruise, pain with ROM, repeat X-ray left elbow, distal left humerus, and left forearm 3 views 04/15/16 improving.

## 2016-04-15 NOTE — Assessment & Plan Note (Signed)
Controlled, continue Amlodipine 50mg , Hydralazi ne 50mg  tid, Furosemide 40mg  daily.

## 2016-04-15 NOTE — Assessment & Plan Note (Addendum)
Trace edema BLE, L>R, continue Furosemide 40mg 

## 2016-04-15 NOTE — Assessment & Plan Note (Addendum)
[place] 04/03/16, c/o left hip and ankle pain, able to bear weight, no noted deformity, taking Norco 10/325mg  q6 for OA left knee, left wrist pain, back pain already, 04/03/16  X-ray left hip and ankle, no acute fxs, observe. 04/15/16 c/o lateral left knee pain, worse with weight bearing, no warmth, swelling, redness/bruise observed. Obtain X-ray left knee to r/o fx

## 2016-04-15 NOTE — Assessment & Plan Note (Signed)
04/03/16 X-ray L hip/ankle, no fxs. [place] 04/03/16, c/o left hip and ankle pain, able to bear weight, no noted deformity, taking Norco 10/325mg  q6 for OA left knee, left wrist pain, back pain already,

## 2016-04-16 DIAGNOSIS — M79662 Pain in left lower leg: Secondary | ICD-10-CM | POA: Diagnosis not present

## 2016-04-16 DIAGNOSIS — M25562 Pain in left knee: Secondary | ICD-10-CM | POA: Diagnosis not present

## 2016-04-22 ENCOUNTER — Non-Acute Institutional Stay (SKILLED_NURSING_FACILITY): Payer: Medicare Other | Admitting: Nurse Practitioner

## 2016-04-22 DIAGNOSIS — R609 Edema, unspecified: Secondary | ICD-10-CM

## 2016-04-22 DIAGNOSIS — N183 Chronic kidney disease, stage 3 unspecified: Secondary | ICD-10-CM

## 2016-04-22 DIAGNOSIS — M150 Primary generalized (osteo)arthritis: Secondary | ICD-10-CM

## 2016-04-22 DIAGNOSIS — M1712 Unilateral primary osteoarthritis, left knee: Secondary | ICD-10-CM | POA: Diagnosis not present

## 2016-04-22 DIAGNOSIS — K219 Gastro-esophageal reflux disease without esophagitis: Secondary | ICD-10-CM | POA: Diagnosis not present

## 2016-04-22 DIAGNOSIS — M159 Polyosteoarthritis, unspecified: Secondary | ICD-10-CM

## 2016-04-22 DIAGNOSIS — E039 Hypothyroidism, unspecified: Secondary | ICD-10-CM | POA: Diagnosis not present

## 2016-04-22 DIAGNOSIS — F418 Other specified anxiety disorders: Secondary | ICD-10-CM

## 2016-04-22 DIAGNOSIS — I10 Essential (primary) hypertension: Secondary | ICD-10-CM

## 2016-04-22 DIAGNOSIS — K449 Diaphragmatic hernia without obstruction or gangrene: Secondary | ICD-10-CM

## 2016-04-22 DIAGNOSIS — M353 Polymyalgia rheumatica: Secondary | ICD-10-CM | POA: Diagnosis not present

## 2016-04-22 NOTE — Assessment & Plan Note (Addendum)
04/16/16 X-ray left tibia/fibula/knee: no acute fracture or dislocation. c/o left leg pain in setting of swelling, Ortho consultation, venous US to r/o DVT

## 2016-04-22 NOTE — Assessment & Plan Note (Signed)
Controlled, continue Amlodipine 50mg , Hydralazi ne 50mg  tid, Furosemide 40mg  daily.

## 2016-04-22 NOTE — Assessment & Plan Note (Signed)
Stable, continue Omeprazole 20mg daily.  

## 2016-04-22 NOTE — Assessment & Plan Note (Signed)
Stable, continue Alprazolam 0.25mg  qid (pharm recommendation tid from qid), Sertraline 75mg  daily

## 2016-04-22 NOTE — Assessment & Plan Note (Signed)
Continue Levothyroxine 8mcg, TSH 3.53 02/13/16

## 2016-04-22 NOTE — Assessment & Plan Note (Signed)
Continue Prednisone 1mg  with breakfast, 5mg  at 10am.

## 2016-04-22 NOTE — Assessment & Plan Note (Signed)
Creat 1.4 02/13/16

## 2016-04-22 NOTE — Progress Notes (Signed)
Location:  [facility] Room Number: 65 Place of Service:  SNF (31) Provider:  Mast, Manxie  NP  Jeanmarie Hubert, MD  Patient Care Team: Estill Dooms, MD as PCP - General (Internal Medicine) Laurence Spates, MD as Consulting Physician (Gastroenterology) Hennie Duos, MD as Consulting Physician (Rheumatology) Corliss Parish, MD as Consulting Physician (Nephrology) Jettie Booze, MD as Consulting Physician (Cardiology) Glenna Fellows, MD as Attending Physician (Neurosurgery) Susa Day, MD as Consulting Physician (Orthopedic Surgery) Man Otho Darner, NP as Nurse Practitioner (Internal Medicine)  Extended Emergency Contact Information Primary Emergency Contact: [facility] Address: [address]          [place], [place] [address] [name] of [place] Phone: [phone] Mobile Phone: [phone] Relation: Daughter Secondary Emergency Contact: Donnald Garre Address: [place], [place] Phone: [phone] Mobile Phone: [phone] Relation: Son  Code Status:  DNR Goals of care: Advanced Directive information Advanced Directives 04/24/2016  Does Patient Have a Medical Advance Directive? Yes  Type of Advance Directive Out of facility DNR (pink MOST or yellow form)  Does patient want to make changes to medical advance directive? No - Patient declined  Copy of [place] in Chart? -  Would patient like information on creating a medical advance directive? -  Pre-existing out of facility DNR order (yellow form or pink MOST form) Yellow form placed in chart (order not valid for inpatient use)     Chief Complaint  Patient presents with  . Acute Visit    c/o Lt leg pain,? if she should be walking on it.    HPI:  Pt is a 83 y.o. female seen today for an acute visit for c/o left leg pain, 04/16/16 X-ray left tibia/fibula/knee: no acute fracture or dislocation. 04/03/16 X-ray L  hip/ankle, no fxs.   04/15/16 c/o lateral left knee pain, worse with weight bearing. Last fall 04/03/16. Hx of left knee effusion, no bruise, warmth, swelling noted.   Hx of HTN, controlled, taking Amlodipine 5mg  qd, Hydralazine 50mg  tid, Furosemide 40mg  qd Mood is managed with Alprazolam, Sertraline 75mg  qd. Thyroid, taking Levothyroxine 53mcg qd, last TSH 3.53 02/13/16. Polymyalgia Rheumatic, chronic Prednisone use.   Past Medical History:  Diagnosis Date  . Anxiety   . Aortic stenosis    a. Mild AS by echo 03/2013.  [place] Kitchen Arthritis   . Bilateral knee pain 06/10/2012  . Cardiomegaly 12/23/2014  . Cataract   . Chronic kidney disease    Stage III/IV  . Colitis, collagenous 07/11/2011  . Corticosteroid dependence ([place]) 12/17/2014  . DDD (degenerative disc disease), thoracolumbar 12/23/2014  . Debility 12/23/2014  . Diverticulitis   . Diverticulosis of colon without hemorrhage 12/23/2014  . Dysphagia 12/23/2014  . Edema 12/23/2014  . Effusion of left knee 12/23/2014  . Enteritis 12/17/2014  . Hiatal hernia 12/23/2014  . HNP (herniated nucleus pulposus), lumbar 12/23/2014   L 4-5   . Hyperlipidemia   . Hypertension   . Hypokalemia   . Hypothyroidism   . IBS (irritable bowel syndrome) 12/23/2014  . Irritable bowel   . Noninfected skin tear of leg 12/23/2014   Left leg   . Osteoarthritis of left knee 12/23/2014  . Osteoporosis   . PMR (polymyalgia rheumatica) (HCC)    a. On chronic prednisone.  . Polymyalgia rheumatica ([place]) 07/11/2011  . Premature atrial contractions    a. Noted during adm 03/2013, corresponding with  patient's palpitations.  . Scoliosis (and kyphoscoliosis), idiopathic 12/23/2014  . Spinal stenosis   . Spinal stenosis of lumbar region 12/23/2014  . Symptomatic bradycardia    a. Adm 03/2013 - taken off clonidine and BB.  . T11 vertebral fracture ([place]) 12/23/2014  . Unstable gait 12/23/2014   Past Surgical History:  Procedure Laterality Date  . ABDOMINAL HYSTERECTOMY  1966  .  APPENDECTOMY    . BACK SURGERY    . Breast biopsies    . CHOLECYSTECTOMY    . EYE SURGERY      Allergies  Allergen Reactions  . Codeine Nausea And Vomiting  . Fosamax [Alendronate Sodium]     Unknown.   Dion Saucier [Calcitonin]     Unknown.  . Nsaids     Due to renal function.   . Quinolones Other (See Comments)    platelets  . Sulfa Antibiotics Nausea And Vomiting    Allergies as of 04/22/2016      Reactions   Codeine Nausea And Vomiting   Fosamax [alendronate Sodium]    Unknown.    Miacalcin [calcitonin]    Unknown.   Nsaids    Due to renal function.    Quinolones Other (See Comments)   platelets   Sulfa Antibiotics Nausea And Vomiting      Medication List       Accurate as of 04/22/16 11:59 PM. Always use your most recent med list.          ALPRAZolam 0.25 MG tablet Commonly known as:  XANAX Take 0.25 mg by mouth 4 (four) times daily.   amLODipine 5 MG tablet Commonly known as:  NORVASC Take 5 mg by mouth daily.   furosemide 40 MG tablet Commonly known as:  LASIX Take 40 mg by mouth every morning.   hydrALAZINE 50 MG tablet Commonly known as:  APRESOLINE Take 1 tablet (50 mg total) by mouth 3 (three) times daily. (Every 8 hours)   HYDROcodone-acetaminophen 10-325 MG tablet Commonly known as:  NORCO Take 1 tablet by mouth every 6 (six) hours as needed.   hydrocortisone cream 1 % Apply 1 application topically. Apply to inflamed anal area twice a day until healed   levothyroxine 50 MCG tablet Commonly known as:  SYNTHROID, LEVOTHROID Take 50 mcg by mouth daily.   omeprazole 20 MG capsule Commonly known as:  PRILOSEC Take 20 mg by mouth daily.   potassium chloride SA 20 MEQ tablet Commonly known as:  K-DUR,KLOR-CON Take 1 tablet (20 mEq total) by mouth 2 (two) times daily.   predniSONE 1 MG tablet Commonly known as:  DELTASONE Take 1 mg by mouth daily with breakfast. Take with prednisone 5mg    predniSONE 5 MG tablet Commonly known as:   DELTASONE Take 5 mg by mouth every morning. Take with prednisone 1 mg   sertraline 50 MG tablet Commonly known as:  ZOLOFT Take 50 mg by mouth daily. Take with 25 mg   sertraline 25 MG tablet Commonly known as:  ZOLOFT Take 25 mg by mouth at bedtime.   silver sulfADIAZINE 1 % cream Commonly known as:  SILVADENE Apply 1 application topically 2 (two) times daily. To the right leg wound.   traMADol 50 MG tablet Commonly known as:  ULTRAM Take 50 mg by mouth every 4 (four) hours as needed for moderate pain or severe pain.   Vitamin D 2000 units Caps Take 2,000 Units by mouth daily.       Review of Systems  Constitutional: Negative  for activity change, appetite change, chills, diaphoresis, fatigue, fever and unexpected weight change.  HENT: Negative for congestion, ear discharge, ear pain, hearing loss, postnasal drip, rhinorrhea, sore throat, tinnitus, trouble swallowing and voice change.        Folliculitis right nostril, mild cellulitis in the area  Eyes: Positive for visual disturbance (corrective lenses). Negative for pain, redness and itching.  Respiratory: Negative for cough, choking, shortness of breath and wheezing.   Cardiovascular: Positive for leg swelling. Negative for chest pain and palpitations.  Gastrointestinal: Negative for abdominal distention, abdominal pain, constipation, diarrhea and nausea.  Endocrine: Negative for cold intolerance, heat intolerance, polydipsia, polyphagia and polyuria.  Genitourinary: Negative for difficulty urinating, dysuria, flank pain, frequency, hematuria, pelvic pain, urgency and vaginal discharge.  Musculoskeletal: Positive for arthralgias, back pain and joint swelling (left hand and wrist. Both knees. Left elbow with bruising and restriction of movement.). Negative for gait problem, myalgias, neck pain and neck stiffness.       Fx left radius at wrist-healed [place] 04/03/16, stated her walker rolled away from her, landed on the left side,  c/o left hip and ankle pain, able to bear weight, no deformity noted, she takes Norco 10/325mg  q6h.  Left elbow bruise, swelling, pain with ROM 04/15/16 c/o lateral left knee pain, worse with weight bearing.   Skin: Positive for wound (Lateral right lower leg open wound, no s/s of infection. Distal right forearm skin tear, proximate with steri strips, healing nicely.). Negative for color change, pallor and rash.       Right lower leg  Allergic/Immunologic: Negative.   Neurological: Positive for weakness and numbness (left hand). Negative for dizziness, tremors, seizures, syncope and headaches.  Hematological: Negative for adenopathy. Does not bruise/bleed easily.  Psychiatric/Behavioral: Positive for dysphoric mood. Negative for agitation, behavioral problems, confusion, hallucinations, sleep disturbance and suicidal ideas. The patient is not nervous/anxious and is not hyperactive.     Immunization History  Administered Date(s) Administered  . Influenza-Unspecified 11/01/2013  . Pneumococcal Polysaccharide-23 03/22/2004  . Tdap 11/21/2011  . Zoster 06/22/2009   Pertinent  Health Maintenance Due  Topic Date Due  . DEXA SCAN  05/07/1984  . PNA vac Low Risk Adult (2 of 2 - PCV13) 03/22/2005  . INFLUENZA VACCINE  08/14/2016   Fall Risk  01/17/2016  Falls in the past year? Yes  Number falls in past yr: 2 or more   Functional Status Survey:    Vitals:   04/24/16 1536  BP: (!) 144/54  Pulse: 72  Resp: 18  Temp: 97.6 F (36.4 C)  SpO2: 96%  Weight: 131 lb 1.6 oz (59.5 kg)  Height: 5\' 4"  (1.626 m)   Body mass index is 22.5 kg/m. Physical Exam  Constitutional: She is oriented to person, place, and time. She appears well-developed and well-nourished. No distress.  Overweight  HENT:  Right Ear: External ear normal.  Left Ear: External ear normal.  Nose: Nose normal.  Mouth/Throat: Oropharynx is clear and moist. No oropharyngeal exudate.  Eyes: Conjunctivae and EOM are normal.  Pupils are equal, round, and reactive to light. No scleral icterus.  Neck: No JVD present. No tracheal deviation present. No thyromegaly present.  Cardiovascular: Normal rate, regular rhythm and intact distal pulses.  Exam reveals no gallop and no friction rub.   Murmur heard. Systolic murmur 9-8/1 the right sternal border.   Pulmonary/Chest: Effort normal. No respiratory distress. She has no wheezes. She has no rales. She exhibits no tenderness.  Abdominal: She exhibits no distension  and no mass. There is no tenderness.  Genitourinary:  Genitourinary Comments: External hemorrhoids.   Musculoskeletal: Normal range of motion. She exhibits edema and tenderness.  Generalized weakness. Left knee effusion. Pain left knee>right knee, lower back, left wrist.  RLE trace edema. c/o left hip and ankle pain 04/03/16 since the fall in am, able to bear weight, no noted deformity.  Chronic edema BLE L>R  Lymphadenopathy:    She has no cervical adenopathy.  Neurological: She is alert and oriented to person, place, and time. No cranial nerve deficit. Coordination normal.  Skin: No rash noted. She is not diaphoretic. No erythema. No pallor.  Lateral right lower leg open wound, no s/s of infection. Distal right forearm skin tear, proximate with steri strips, healing nicely.  Left elbow swelling, pain with ROM, a large bruise.    Psychiatric: Her behavior is normal. Judgment and thought content normal.  Frustrated and depressed about her current situation.    Labs reviewed:  Recent Labs  01/17/16 1440 02/13/16  NA 141 139  K 4.1 3.7  CL 104  --   CO2 28  --   GLUCOSE 119*  --   BUN 36* 33*  CREATININE 1.47* 1.4*  CALCIUM 9.1  --     Recent Labs  02/13/16  AST 14  ALT 9  ALKPHOS 75    Recent Labs  01/17/16 1440 02/13/16  WBC 9.9 7.2  NEUTROABS 8.1*  --   HGB 11.9* 11.4*  HCT 36.8 35*  MCV 87.0  --   PLT 199 144*   Lab Results  Component Value Date   TSH 3.53 02/13/2016   No  results found for: HGBA1C No results found for: CHOL, HDL, LDLCALC, LDLDIRECT, TRIG, CHOLHDL  Significant Diagnostic Results in last 30 days:  No results found.  Assessment/Plan Osteoarthritis of left knee 04/16/16 X-ray left tibia/fibula/knee: no acute fracture or dislocation. c/o left leg pain in setting of swelling, Ortho consultation, venous US to r/o DVT  Osteoarthritis, multiple sites  left leg pain, 04/16/16 X-ray left tibia/fibula/knee: no acute fracture or dislocation. 04/03/16 X-ray L hip/ankle, no fxs. Prn Norco available to her.   HTN (hypertension) Controlled, continue Amlodipine 50mg , Hydralazi ne 50mg  tid, Furosemide 40mg  daily.  Hiatal hernia Stable, continue Omeprazole 20mg  daily.   GERD (gastroesophageal reflux disease) Stable, continue Omeprazole 20mg  daily.   Hypothyroidism Continue Levothyroxine 51mcg, TSH 3.53 02/13/16  Chronic kidney disease, stage III/IV Creat 1.4 02/13/16  Polymyalgia rheumatica Continue Prednisone 1mg  with breakfast, 5mg  at 10am.   Edema Trace edema BLE, L>R, continue Furosemide 40mg   Depression with anxiety Stable, continue Alprazolam 0.25mg  qid (pharm recommendation tid from qid), Sertraline 75mg  daily     Family/ staff Communication: [place] vs AL when able.   Labs/tests ordered: venous US left leg.

## 2016-04-22 NOTE — Assessment & Plan Note (Signed)
left leg pain, 04/16/16 X-ray left tibia/fibula/knee: no acute fracture or dislocation. 04/03/16 X-ray L hip/ankle, no fxs. Prn Norco available to her.

## 2016-04-22 NOTE — Assessment & Plan Note (Signed)
Trace edema BLE, L>R, continue Furosemide 40mg 

## 2016-04-23 DIAGNOSIS — M7989 Other specified soft tissue disorders: Secondary | ICD-10-CM | POA: Diagnosis not present

## 2016-04-24 ENCOUNTER — Encounter: Payer: Self-pay | Admitting: Nurse Practitioner

## 2016-04-25 ENCOUNTER — Encounter: Payer: Self-pay | Admitting: Nurse Practitioner

## 2016-04-25 DIAGNOSIS — M7122 Synovial cyst of popliteal space [Baker], left knee: Secondary | ICD-10-CM | POA: Insufficient documentation

## 2016-05-08 DIAGNOSIS — G5602 Carpal tunnel syndrome, left upper limb: Secondary | ICD-10-CM | POA: Diagnosis not present

## 2016-05-08 DIAGNOSIS — S52592D Other fractures of lower end of left radius, subsequent encounter for closed fracture with routine healing: Secondary | ICD-10-CM | POA: Diagnosis not present

## 2016-05-08 DIAGNOSIS — M25522 Pain in left elbow: Secondary | ICD-10-CM | POA: Diagnosis not present

## 2016-05-08 DIAGNOSIS — M25422 Effusion, left elbow: Secondary | ICD-10-CM | POA: Diagnosis not present

## 2016-05-23 ENCOUNTER — Non-Acute Institutional Stay (SKILLED_NURSING_FACILITY): Payer: Medicare Other | Admitting: Nurse Practitioner

## 2016-05-23 ENCOUNTER — Encounter: Payer: Self-pay | Admitting: Nurse Practitioner

## 2016-05-23 DIAGNOSIS — K589 Irritable bowel syndrome without diarrhea: Secondary | ICD-10-CM

## 2016-05-23 DIAGNOSIS — F418 Other specified anxiety disorders: Secondary | ICD-10-CM

## 2016-05-23 DIAGNOSIS — R269 Unspecified abnormalities of gait and mobility: Secondary | ICD-10-CM

## 2016-05-23 DIAGNOSIS — I10 Essential (primary) hypertension: Secondary | ICD-10-CM | POA: Diagnosis not present

## 2016-05-23 DIAGNOSIS — E039 Hypothyroidism, unspecified: Secondary | ICD-10-CM

## 2016-05-23 DIAGNOSIS — M1990 Unspecified osteoarthritis, unspecified site: Secondary | ICD-10-CM

## 2016-05-23 DIAGNOSIS — K449 Diaphragmatic hernia without obstruction or gangrene: Secondary | ICD-10-CM | POA: Diagnosis not present

## 2016-05-23 DIAGNOSIS — N183 Chronic kidney disease, stage 3 unspecified: Secondary | ICD-10-CM

## 2016-05-23 DIAGNOSIS — M353 Polymyalgia rheumatica: Secondary | ICD-10-CM

## 2016-05-23 DIAGNOSIS — R609 Edema, unspecified: Secondary | ICD-10-CM | POA: Diagnosis not present

## 2016-05-23 NOTE — Assessment & Plan Note (Signed)
Continue Levothyroxine 43mcg, TSH 3.53 02/13/16

## 2016-05-23 NOTE — Assessment & Plan Note (Addendum)
Controlled, continue Amlodipine 50mg , Hydralazi ne 50mg  tid, Furosemide 40mg  daily. Update CBC CMP

## 2016-05-23 NOTE — Assessment & Plan Note (Signed)
Stable

## 2016-05-23 NOTE — Assessment & Plan Note (Signed)
Creat 1.4 02/13/16, update CMP, Furosemide may contributory

## 2016-05-23 NOTE — Assessment & Plan Note (Signed)
Stable, continue Omeprazole 20mg daily.  

## 2016-05-23 NOTE — Assessment & Plan Note (Signed)
Multiple sites, c/o the right groin pain with the right leg movement, but able to wear weight and ambulate without pain, no erythema, swelling, pain palpated in the area, observe

## 2016-05-23 NOTE — Assessment & Plan Note (Signed)
Stable, continue Alprazolam 0.25mg  qid (pharm recommendation tid from qid), Sertraline 75mg  daily

## 2016-05-23 NOTE — Assessment & Plan Note (Signed)
Ambulate with walker

## 2016-05-23 NOTE — Assessment & Plan Note (Signed)
Continue Prednisone 1mg  with breakfast, 5mg  at 10am.

## 2016-05-23 NOTE — Assessment & Plan Note (Signed)
Trace edema BLE, L>R, continue Furosemide 40mg 

## 2016-05-23 NOTE — Progress Notes (Signed)
Location:  [facility] Room Number: 18 Place of Service:  SNF (31) Provider:  Traivon Morrical, Manxie  NP  Estill Dooms, MD  Patient Care Team: Estill Dooms, MD as PCP - General (Internal Medicine) Laurence Spates, MD as Consulting Physician (Gastroenterology) Hennie Duos, MD as Consulting Physician (Rheumatology) Corliss Parish, MD as Consulting Physician (Nephrology) Jettie Booze, MD as Consulting Physician (Cardiology) Glenna Fellows, MD as Attending Physician (Neurosurgery) Susa Day, MD as Consulting Physician (Orthopedic Surgery) Ziyan Schoon X, NP as Nurse Practitioner (Internal Medicine)  Extended Emergency Contact Information Primary Emergency Contact: [facility] Address: [address]          [place], [place] [address] [name] of [place] Phone: [phone] Mobile Phone: [phone] Relation: Daughter Secondary Emergency Contact: Donnald Garre Address: [place], [place] Phone: [phone] Mobile Phone: [phone] Relation: Son  Code Status:  DNR Goals of care: Advanced Directive information Advanced Directives 05/23/2016  Does Patient Have a Medical Advance Directive? Yes  Type of Advance Directive Out of facility DNR (pink MOST or yellow form)  Does patient want to make changes to medical advance directive? No - Patient declined  Copy of [place] in Chart? -  Would patient like information on creating a medical advance directive? -  Pre-existing out of facility DNR order (yellow form or pink MOST form) Yellow form placed in chart (order not valid for inpatient use)     Chief Complaint  Patient presents with  . Medical Management of Chronic Issues    c/o groin pain (R)    HPI:  Pt is a 83 y.o. female seen today for an acute visit for 05/23/16 positional right groin pain, able to bear weight and ambulate with walker.  ;   c/o left leg pain,  04/16/16 X-ray left tibia/fibula/knee: no acute fracture or dislocation. 04/03/16 X-ray L hip/ankle, no fxs.   04/15/16 c/o lateral left knee pain, worse with weight bearing. Last fall 04/03/16. Hx of left knee effusion, no bruise, warmth, swelling noted.   Hx of HTN, controlled, taking Amlodipine 5mg  qd, Hydralazine 50mg  tid, Furosemide 40mg  qd Mood is managed with Alprazolam, Sertraline 75mg  qd. Thyroid, taking Levothyroxine 76mcg qd, last TSH 3.53 02/13/16. Polymyalgia Rheumatic, chronic Prednisone use.   Past Medical History:  Diagnosis Date  . Anxiety   . Aortic stenosis    a. Mild AS by echo 03/2013.  Marland Kitchen Arthritis   . Bilateral knee pain 06/10/2012  . Cardiomegaly 12/23/2014  . Cataract   . Chronic kidney disease    Stage III/IV  . Colitis, collagenous 07/11/2011  . Corticosteroid dependence ([place]) 12/17/2014  . DDD (degenerative disc disease), thoracolumbar 12/23/2014  . Debility 12/23/2014  . Diverticulitis   . Diverticulosis of colon without hemorrhage 12/23/2014  . Dysphagia 12/23/2014  . Edema 12/23/2014  . Effusion of left knee 12/23/2014  . Enteritis 12/17/2014  . Hiatal hernia 12/23/2014  . HNP (herniated nucleus pulposus), lumbar 12/23/2014   L 4-5   . Hyperlipidemia   . Hypertension   . Hypokalemia   . Hypothyroidism   . IBS (irritable bowel syndrome) 12/23/2014  . Irritable bowel   . Noninfected skin tear of leg 12/23/2014   Left leg   . Osteoarthritis of left knee 12/23/2014  . Osteoporosis   . PMR (polymyalgia rheumatica) (HCC)    a. On chronic prednisone.  . Polymyalgia rheumatica ([place]) 07/11/2011  .  Premature atrial contractions    a. Noted during adm 03/2013, corresponding with patient's palpitations.  . Scoliosis (and kyphoscoliosis), idiopathic 12/23/2014  . Spinal stenosis   . Spinal stenosis of lumbar region 12/23/2014  . Symptomatic bradycardia    a. Adm 03/2013 - taken off clonidine and BB.  . T11 vertebral fracture ([place]) 12/23/2014  . Unstable gait 12/23/2014   Past  Surgical History:  Procedure Laterality Date  . ABDOMINAL HYSTERECTOMY  1966  . APPENDECTOMY    . BACK SURGERY    . Breast biopsies    . CHOLECYSTECTOMY    . EYE SURGERY      Allergies  Allergen Reactions  . Codeine Nausea And Vomiting  . Fosamax [Alendronate Sodium]     Unknown.   Dion Saucier [Calcitonin]     Unknown.  . Nsaids     Due to renal function.   . Quinolones Other (See Comments)    platelets  . Sulfa Antibiotics Nausea And Vomiting    Allergies as of 05/23/2016      Reactions   Codeine Nausea And Vomiting   Fosamax [alendronate Sodium]    Unknown.    Miacalcin [calcitonin]    Unknown.   Nsaids    Due to renal function.    Quinolones Other (See Comments)   platelets   Sulfa Antibiotics Nausea And Vomiting      Medication List       Accurate as of 05/23/16  1:35 PM. Always use your most recent med list.          ALPRAZolam 0.25 MG tablet Commonly known as:  XANAX Take 0.25 mg by mouth 4 (four) times daily.   amLODipine 5 MG tablet Commonly known as:  NORVASC Take 5 mg by mouth daily.   furosemide 40 MG tablet Commonly known as:  LASIX Take 40 mg by mouth every morning.   hydrALAZINE 50 MG tablet Commonly known as:  APRESOLINE Take 1 tablet (50 mg total) by mouth 3 (three) times daily. (Every 8 hours)   HYDROcodone-acetaminophen 10-325 MG tablet Commonly known as:  NORCO Take 1 tablet by mouth every 6 (six) hours as needed.   hydrocortisone cream 1 % Apply 1 application topically. Apply to inflamed anal area twice a day until healed   levothyroxine 50 MCG tablet Commonly known as:  SYNTHROID, LEVOTHROID Take 50 mcg by mouth daily.   omeprazole 20 MG capsule Commonly known as:  PRILOSEC Take 20 mg by mouth daily.   potassium chloride SA 20 MEQ tablet Commonly known as:  K-DUR,KLOR-CON Take 1 tablet (20 mEq total) by mouth 2 (two) times daily.   predniSONE 1 MG tablet Commonly known as:  DELTASONE Take 1 mg by mouth daily with  breakfast. Take with prednisone 5mg    predniSONE 5 MG tablet Commonly known as:  DELTASONE Take 5 mg by mouth every morning. Take with prednisone 1 mg   sertraline 50 MG tablet Commonly known as:  ZOLOFT Take 50 mg by mouth daily. Take with 25 mg   sertraline 25 MG tablet Commonly known as:  ZOLOFT Take 25 mg by mouth at bedtime.   silver sulfADIAZINE 1 % cream Commonly known as:  SILVADENE Apply 1 application topically 2 (two) times daily. To the right leg wound.   traMADol 50 MG tablet Commonly known as:  ULTRAM Take 50 mg by mouth every 4 (four) hours as needed for moderate pain or severe pain.   Vitamin D 2000 units Caps Take 2,000 Units by  mouth daily.       Review of Systems  Constitutional: Negative for activity change, appetite change, chills, diaphoresis, fatigue, fever and unexpected weight change.  HENT: Negative for congestion, ear discharge, ear pain, hearing loss, postnasal drip, rhinorrhea, sore throat, tinnitus, trouble swallowing and voice change.        Folliculitis right nostril, mild cellulitis in the area  Eyes: Positive for visual disturbance (corrective lenses). Negative for pain, redness and itching.  Respiratory: Negative for cough, choking, shortness of breath and wheezing.   Cardiovascular: Positive for leg swelling. Negative for chest pain and palpitations.  Gastrointestinal: Negative for abdominal distention, abdominal pain, constipation, diarrhea and nausea.  Endocrine: Negative for cold intolerance, heat intolerance, polydipsia, polyphagia and polyuria.  Genitourinary: Negative for difficulty urinating, dysuria, flank pain, frequency, hematuria, pelvic pain, urgency and vaginal discharge.  Musculoskeletal: Positive for arthralgias, back pain, gait problem and joint swelling (left hand and wrist. Both knees. Left elbow with bruising and restriction of movement.). Negative for myalgias, neck pain and neck stiffness.       Fx left radius at  wrist-healed [place] 04/03/16, stated her walker rolled away from her, landed on the left side, c/o left hip and ankle pain, able to bear weight, no deformity noted, she takes Norco 10/325mg  q6h.  Left elbow bruise, swelling, pain with ROM 04/15/16 c/o lateral left knee pain, worse with weight bearing.  05/23/16 positional right groin pain, able to bear weight and ambulate with walker.   Skin: Positive for wound (Lateral right lower leg open wound, no s/s of infection. Distal right forearm skin tear, proximate with steri strips, healing nicely.). Negative for color change, pallor and rash.       Right lower leg  Allergic/Immunologic: Negative.   Neurological: Positive for weakness and numbness (left hand). Negative for dizziness, tremors, seizures, syncope and headaches.  Hematological: Negative for adenopathy. Does not bruise/bleed easily.  Psychiatric/Behavioral: Positive for dysphoric mood. Negative for agitation, behavioral problems, confusion, hallucinations, sleep disturbance and suicidal ideas. The patient is not nervous/anxious and is not hyperactive.     Immunization History  Administered Date(s) Administered  . Influenza-Unspecified 11/01/2013  . Pneumococcal Polysaccharide-23 03/22/2004  . Tdap 11/21/2011  . Zoster 06/22/2009   Pertinent  Health Maintenance Due  Topic Date Due  . DEXA SCAN  05/07/1984  . PNA vac Low Risk Adult (2 of 2 - PCV13) 03/22/2005  . INFLUENZA VACCINE  08/14/2016   Fall Risk  01/17/2016  Falls in the past year? Yes  Number falls in past yr: 2 or more   Functional Status Survey:    Vitals:   05/23/16 1140  BP: (!) 158/78  Pulse: 78  Resp: 18  Temp: 98 F (36.7 C)  Weight: 129 lb 4.8 oz (58.7 kg)  Height: 5\' 4"  (1.626 m)   Body mass index is 22.19 kg/m. Physical Exam  Constitutional: She is oriented to person, place, and time. She appears well-developed and well-nourished. No distress.  Overweight  HENT:  Right Ear: External ear normal.  Left  Ear: External ear normal.  Nose: Nose normal.  Mouth/Throat: Oropharynx is clear and moist. No oropharyngeal exudate.  Eyes: Conjunctivae and EOM are normal. Pupils are equal, round, and reactive to light. No scleral icterus.  Neck: No JVD present. No tracheal deviation present. No thyromegaly present.  Cardiovascular: Normal rate, regular rhythm and intact distal pulses.  Exam reveals no gallop and no friction rub.   Murmur heard. Systolic murmur 5-0/9 the right sternal border.  Pulmonary/Chest: Effort normal. No respiratory distress. She has no wheezes. She has no rales. She exhibits no tenderness.  Abdominal: She exhibits no distension and no mass. There is no tenderness.  Genitourinary:  Genitourinary Comments: External hemorrhoids.   Musculoskeletal: Normal range of motion. She exhibits edema and tenderness.  Generalized weakness. Left knee effusion. Pain left knee>right knee, lower back, left wrist.  RLE trace edema. c/o left hip and ankle pain 04/03/16 since the fall in am, able to bear weight, no noted deformity.  Chronic edema BLE L>R  Lymphadenopathy:    She has no cervical adenopathy.  Neurological: She is alert and oriented to person, place, and time. No cranial nerve deficit. Coordination normal.  Skin: No rash noted. She is not diaphoretic. No erythema. No pallor.  Lateral right lower leg open wound, no s/s of infection. Distal right forearm skin tear, proximate with steri strips, healing nicely.  Left elbow swelling, pain with ROM, a large bruise.    Psychiatric: Her behavior is normal. Judgment and thought content normal.  Frustrated and depressed about her current situation.    Labs reviewed:  Recent Labs  01/17/16 1440 02/13/16  NA 141 139  K 4.1 3.7  CL 104  --   CO2 28  --   GLUCOSE 119*  --   BUN 36* 33*  CREATININE 1.47* 1.4*  CALCIUM 9.1  --     Recent Labs  02/13/16  AST 14  ALT 9  ALKPHOS 75    Recent Labs  01/17/16 1440 02/13/16  WBC 9.9  7.2  NEUTROABS 8.1*  --   HGB 11.9* 11.4*  HCT 36.8 35*  MCV 87.0  --   PLT 199 144*   Lab Results  Component Value Date   TSH 3.53 02/13/2016   No results found for: HGBA1C No results found for: CHOL, HDL, LDLCALC, LDLDIRECT, TRIG, CHOLHDL  Significant Diagnostic Results in last 30 days:  No results found.  Assessment/Plan Osteoarthritis (arthritis due to wear and tear of joints) Multiple sites, c/o the right groin pain with the right leg movement, but able to wear weight and ambulate without pain, no erythema, swelling, pain palpated in the area, observe  HTN (hypertension) Controlled, continue Amlodipine 50mg , Hydralazi ne 50mg  tid, Furosemide 40mg  daily. Update CBC CMP   Hiatal hernia Stable, continue Omeprazole 20mg  daily.   IBS (irritable bowel syndrome) Stable.   Hypothyroidism Continue Levothyroxine 80mcg, TSH 3.53 02/13/16  Chronic kidney disease, stage III/IV Creat 1.4 02/13/16, update CMP, Furosemide may contributory  Polymyalgia rheumatica Continue Prednisone 1mg  with breakfast, 5mg  at 10am.   Gait abnormality Ambulate with walker   Edema Trace edema BLE, L>R, continue Furosemide 40mg   Depression with anxiety Stable, continue Alprazolam 0.25mg  qid (pharm recommendation tid from qid), Sertraline 75mg  daily     Family/ staff Communication: SNF  Labs/tests ordered: CBC CMP

## 2016-05-28 DIAGNOSIS — M6281 Muscle weakness (generalized): Secondary | ICD-10-CM | POA: Diagnosis not present

## 2016-05-28 DIAGNOSIS — E876 Hypokalemia: Secondary | ICD-10-CM | POA: Diagnosis not present

## 2016-05-28 DIAGNOSIS — R001 Bradycardia, unspecified: Secondary | ICD-10-CM | POA: Diagnosis not present

## 2016-05-28 DIAGNOSIS — E039 Hypothyroidism, unspecified: Secondary | ICD-10-CM | POA: Diagnosis not present

## 2016-05-28 DIAGNOSIS — I129 Hypertensive chronic kidney disease with stage 1 through stage 4 chronic kidney disease, or unspecified chronic kidney disease: Secondary | ICD-10-CM | POA: Diagnosis not present

## 2016-05-28 DIAGNOSIS — I4891 Unspecified atrial fibrillation: Secondary | ICD-10-CM | POA: Diagnosis not present

## 2016-05-28 LAB — BASIC METABOLIC PANEL
BUN: 39 — AB (ref 4–21)
CREATININE: 1.6 — AB (ref 0.5–1.1)
Glucose: 84
POTASSIUM: 3.5 (ref 3.4–5.3)
SODIUM: 144 (ref 137–147)

## 2016-05-28 LAB — HEPATIC FUNCTION PANEL
ALK PHOS: 116 (ref 25–125)
ALT: 8 (ref 7–35)
AST: 15 (ref 13–35)
Bilirubin, Total: 0.3

## 2016-05-28 LAB — CBC AND DIFFERENTIAL
HCT: 35 — AB (ref 36–46)
Hemoglobin: 11.5 — AB (ref 12.0–16.0)
PLATELETS: 172 (ref 150–399)
WBC: 7.5

## 2016-06-17 ENCOUNTER — Encounter: Payer: Self-pay | Admitting: Nurse Practitioner

## 2016-06-17 DIAGNOSIS — K5900 Constipation, unspecified: Secondary | ICD-10-CM | POA: Insufficient documentation

## 2016-06-19 DIAGNOSIS — G5602 Carpal tunnel syndrome, left upper limb: Secondary | ICD-10-CM | POA: Diagnosis not present

## 2016-06-19 DIAGNOSIS — S52592D Other fractures of lower end of left radius, subsequent encounter for closed fracture with routine healing: Secondary | ICD-10-CM | POA: Diagnosis not present

## 2016-07-04 ENCOUNTER — Encounter: Payer: Self-pay | Admitting: Internal Medicine

## 2016-07-04 ENCOUNTER — Non-Acute Institutional Stay (SKILLED_NURSING_FACILITY): Payer: Medicare Other | Admitting: Internal Medicine

## 2016-07-04 DIAGNOSIS — R238 Other skin changes: Secondary | ICD-10-CM

## 2016-07-04 DIAGNOSIS — I10 Essential (primary) hypertension: Secondary | ICD-10-CM | POA: Diagnosis not present

## 2016-07-04 DIAGNOSIS — E039 Hypothyroidism, unspecified: Secondary | ICD-10-CM | POA: Diagnosis not present

## 2016-07-04 DIAGNOSIS — F411 Generalized anxiety disorder: Secondary | ICD-10-CM | POA: Diagnosis not present

## 2016-07-04 DIAGNOSIS — G8929 Other chronic pain: Secondary | ICD-10-CM | POA: Diagnosis not present

## 2016-07-04 DIAGNOSIS — N183 Chronic kidney disease, stage 3 unspecified: Secondary | ICD-10-CM

## 2016-07-04 DIAGNOSIS — M353 Polymyalgia rheumatica: Secondary | ICD-10-CM

## 2016-07-04 DIAGNOSIS — R233 Spontaneous ecchymoses: Secondary | ICD-10-CM

## 2016-07-04 DIAGNOSIS — F4321 Adjustment disorder with depressed mood: Secondary | ICD-10-CM | POA: Diagnosis not present

## 2016-07-04 DIAGNOSIS — M5442 Lumbago with sciatica, left side: Secondary | ICD-10-CM | POA: Diagnosis not present

## 2016-07-04 NOTE — Progress Notes (Signed)
Location:  [facility] Room Number: 53 Place of Service:  SNF [phone]) Provider:  Lenard Kampf Molly Maduro, Liel Rudden MD  Patient Care Team: Blanchie Serve, MD as PCP - General (Internal Medicine) Laurence Spates, MD as Consulting Physician (Gastroenterology) Hennie Duos, MD as Consulting Physician (Rheumatology) Corliss Parish, MD as Consulting Physician (Nephrology) Jettie Booze, MD as Consulting Physician (Cardiology) Glenna Fellows, MD as Attending Physician (Neurosurgery) Susa Day, MD as Consulting Physician (Orthopedic Surgery) Mast, Man X, NP as Nurse Practitioner (Internal Medicine)  Extended Emergency Contact Information Primary Emergency Contact: [facility] Address: [address]          [place], [place] [address] [name] of [place] Phone: [phone] Mobile Phone: [phone] Relation: Daughter Secondary Emergency Contact: Donnald Garre Address: [place], [place] Phone: [phone] Mobile Phone: [phone] Relation: Son  Code Status:  DNR Goals of care: Advanced Directive information Advanced Directives 05/23/2016  Does Patient Have a Medical Advance Directive? Yes  Type of Advance Directive Out of facility DNR (pink MOST or yellow form)  Does patient want to make changes to medical advance directive? No - Patient declined  Copy of [place] in Chart? -  Would patient like information on creating a medical advance directive? -  Pre-existing out of facility DNR order (yellow form or pink MOST form) Yellow form placed in chart (order not valid for inpatient use)     Chief Complaint  Patient presents with  . Medical Management of Chronic Issues    Routine Visit    HPI:  Pt is a 83 y.o. female seen today for medical management of chronic diseases. She has been at her baseline. She complaints of ongoing aches and pain to her left hand, left leg  and back. She gets around with her walker with left arm rest. No fall reported in last 3 months. No acute behavior change reported.    Past Medical History:  Diagnosis Date  . Anxiety   . Aortic stenosis    a. Mild AS by echo 03/2013.  Marland Kitchen Arthritis   . Bilateral knee pain 06/10/2012  . Cardiomegaly 12/23/2014  . Cataract   . Chronic kidney disease    Stage III/IV  . Colitis, collagenous 07/11/2011  . Corticosteroid dependence ([place]) 12/17/2014  . DDD (degenerative disc disease), thoracolumbar 12/23/2014  . Debility 12/23/2014  . Diverticulitis   . Diverticulosis of colon without hemorrhage 12/23/2014  . Dysphagia 12/23/2014  . Edema 12/23/2014  . Effusion of left knee 12/23/2014  . Enteritis 12/17/2014  . Hiatal hernia 12/23/2014  . HNP (herniated nucleus pulposus), lumbar 12/23/2014   L 4-5   . Hyperlipidemia   . Hypertension   . Hypokalemia   . Hypothyroidism   . IBS (irritable bowel syndrome) 12/23/2014  . Irritable bowel   . Noninfected skin tear of leg 12/23/2014   Left leg   . Osteoarthritis of left knee 12/23/2014  . Osteoporosis   . PMR (polymyalgia rheumatica) (HCC)    a. On chronic prednisone.  . Polymyalgia rheumatica ([place]) 07/11/2011  . Premature atrial contractions    a. Noted during adm 03/2013, corresponding with patient's palpitations.  . Scoliosis (and kyphoscoliosis), idiopathic 12/23/2014  . Spinal stenosis   . Spinal stenosis of lumbar region 12/23/2014  . Symptomatic bradycardia    a. Adm 03/2013 - taken off clonidine and BB.  . T11 vertebral fracture ([place]) 12/23/2014  .  Unstable gait 12/23/2014   Past Surgical History:  Procedure Laterality Date  . ABDOMINAL HYSTERECTOMY  1966  . APPENDECTOMY    . BACK SURGERY    . Breast biopsies    . CHOLECYSTECTOMY    . EYE SURGERY      Allergies  Allergen Reactions  . Codeine Nausea And Vomiting  . Fosamax [Alendronate Sodium]     Unknown.   Dion Saucier [Calcitonin]     Unknown.  . Nsaids     Due to renal function.    . Quinolones Other (See Comments)    platelets  . Sulfa Antibiotics Nausea And Vomiting    Outpatient Encounter Prescriptions as of 07/04/2016  Medication Sig  . ALPRAZolam (XANAX) 0.25 MG tablet Take 0.25 mg by mouth 4 (four) times daily.  Marland Kitchen amLODipine (NORVASC) 5 MG tablet Take 5 mg by mouth daily.  . Cholecalciferol (VITAMIN D) 2000 UNITS CAPS Take 2,000 Units by mouth daily.  Marland Kitchen docusate sodium (COLACE) 100 MG capsule Take 100 mg by mouth 2 (two) times daily as needed for mild constipation.  . furosemide (LASIX) 40 MG tablet Take 40 mg by mouth every morning.   . hydrALAZINE (APRESOLINE) 50 MG tablet Take 1 tablet (50 mg total) by mouth 3 (three) times daily. (Every 8 hours)  . HYDROcodone-acetaminophen (NORCO) 10-325 MG tablet Take 1 tablet by mouth every 6 (six) hours.   . hydrocortisone cream 1 % Apply 1 application topically. Apply to inflamed anal area twice a day until healed  . levothyroxine (SYNTHROID, LEVOTHROID) 50 MCG tablet Take 50 mcg by mouth daily.   Marland Kitchen omeprazole (PRILOSEC) 20 MG capsule Take 20 mg by mouth daily.  . potassium chloride SA (K-DUR,KLOR-CON) 20 MEQ tablet Take 1 tablet (20 mEq total) by mouth 2 (two) times daily.  . predniSONE (DELTASONE) 1 MG tablet Take 1 mg by mouth daily with breakfast. Take with prednisone 5mg   . predniSONE (DELTASONE) 5 MG tablet Take 5 mg by mouth every morning. Take with prednisone 1 mg  . sertraline (ZOLOFT) 50 MG tablet Take 50 mg by mouth at bedtime. Take with 25 mg to equal a total of 75 mg.  . traMADol (ULTRAM) 50 MG tablet Take 50 mg by mouth every 4 (four) hours as needed for moderate pain or severe pain.  . [DISCONTINUED] sertraline (ZOLOFT) 25 MG tablet Take 25 mg by mouth at bedtime.   . [DISCONTINUED] silver sulfADIAZINE (SILVADENE) 1 % cream Apply 1 application topically 2 (two) times daily. To the right leg wound.   No facility-administered encounter medications on file as of 07/04/2016.     Review of Systems    Constitutional: Negative for appetite change, chills, diaphoresis, fatigue and fever.       She feeds herself. She is compliant with her medications  HENT: Positive for hearing loss. Negative for congestion, mouth sores and rhinorrhea.   Eyes: Negative for visual disturbance.       Has corrective glasses  Respiratory: Positive for shortness of breath. Negative for cough and wheezing.        Dyspnea with exertion  Cardiovascular: Negative for chest pain, palpitations and leg swelling.  Gastrointestinal: Negative for abdominal pain, blood in stool, constipation, diarrhea, nausea and vomiting.  Genitourinary: Negative for dysuria.       She is continent with her bowel and bladder  Musculoskeletal: Positive for arthralgias, back pain and gait problem. Negative for joint swelling.  Skin: Negative for rash.  Neurological: Positive for numbness. Negative for  dizziness, seizures and headaches.       Has numbness and tingling intermittently to left forearm and hand  Hematological: Bruises/bleeds easily.  Psychiatric/Behavioral: Negative for agitation and behavioral problems. The patient is not nervous/anxious.     Immunization History  Administered Date(s) Administered  . Influenza-Unspecified 11/01/2013  . Pneumococcal Polysaccharide-23 03/22/2004  . Tdap 11/21/2011  . Zoster 06/22/2009   Pertinent  Health Maintenance Due  Topic Date Due  . PNA vac Low Risk Adult (2 of 2 - PCV13) 01/14/2017 (Originally 03/22/2005)  . DEXA SCAN  07/04/2017 (Originally 05/07/1984)  . INFLUENZA VACCINE  08/14/2016   Fall Risk  01/17/2016  Falls in the past year? Yes  Number falls in past yr: 2 or more   Functional Status Survey:    Vitals:   07/04/16 1301  BP: 136/76  Pulse: 72  Resp: 18  Temp: 98 F (36.7 C)  TempSrc: Oral  Weight: 131 lb 9.6 oz (59.7 kg)  Height: 5\' 4"  (1.626 m)   Body mass index is 22.59 kg/m. Physical Exam  Constitutional: She is oriented to person, place, and time. She  appears well-developed and well-nourished. No distress.  HENT:  Head: Normocephalic and atraumatic.  Eyes: Pupils are equal, round, and reactive to light.  Neck: Normal range of motion. Neck supple.  Cardiovascular: Normal rate and regular rhythm.   Murmur heard. Systolic murmur  Pulmonary/Chest: Effort normal and breath sounds normal.  Abdominal: Soft. She exhibits no distension. There is no tenderness. There is no guarding.  Musculoskeletal: She exhibits edema.  Limited range of motion to both shoulder right > left, severe arthritis changes to her fingers, ROM left wrist limited, chronic back pain, trace leg edema  Lymphadenopathy:    She has no cervical adenopathy.  Neurological: She is alert and oriented to person, place, and time.  Skin: Skin is warm and dry. She is not diaphoretic.  Easy bruising, senile purpura present  Psychiatric: She has a normal mood and affect. Her behavior is normal.    Labs reviewed:  Recent Labs  01/17/16 1440 02/13/16 05/28/16  NA 141 139 144  K 4.1 3.7 3.5  CL 104  --   --   CO2 28  --   --   GLUCOSE 119*  --   --   BUN 36* 33* 39*  CREATININE 1.47* 1.4* 1.6*  CALCIUM 9.1  --   --     Recent Labs  02/13/16 05/28/16  AST 14 15  ALT 9 8  ALKPHOS 75 116    Recent Labs  01/17/16 1440 02/13/16 05/28/16  WBC 9.9 7.2 7.5  NEUTROABS 8.1*  --   --   HGB 11.9* 11.4* 11.5*  HCT 36.8 35* 35*  MCV 87.0  --   --   PLT 199 144* 172   Lab Results  Component Value Date   TSH 3.53 02/13/2016   No results found for: HGBA1C No results found for: CHOL, HDL, LDLCALC, LDLDIRECT, TRIG, CHOLHDL  Significant Diagnostic Results in last 30 days:  No results found.  Assessment/Plan  Essential HTN Continue amlodipine 5 mg daily, furosemide 40 mg daily and hydralazine 50 mg tid. Reviewed BP reading, stable, continue current regimen. Continue kcl supplement.   Chronic back pain Currently on norco 10-325 mg qid with prn order for tramadol. Monitor  clinically, next visit address dose adjustment if indicated  PMR Currently on norco 10-325 qid, tramadol 50 mg q4h prn pain and prednisone 5 mg daily. Monitor clinically  Situational  depression Sertraline 75 mg qhs has been helpful, she does not want dose of medication adjusted at present, monitor clinically  Hypothyroidism Lab Results  Component Value Date   TSH 3.53 02/13/2016   Stable, continue levothyrxoine 50 mcg daily  GAD Continue alprazolam 0.25 mg qid, on chart review has been on this dosing since 2016. stable mood, pt refuses dose adjustment for now.   ckd stage 3 Monitor BMP, reviewed labs from 5/18. Currently on furosemide  Easy bruising Age and chronic prednisone use related, skin care per facility protocol   Family/ staff Communication: reviewed care plan with patient and charge nurse.    Labs/tests ordered:  TSH, cmp 3 months   Blanchie Serve, MD Internal Medicine [facility] [address] [place], [place] [address] Cell Phone (Monday-Friday 8 am - 5 pm): [phone] On Call: [phone] and follow prompts after 5 pm and on weekends Office Phone: [phone] Office Fax: [phone]

## 2016-07-22 ENCOUNTER — Non-Acute Institutional Stay (SKILLED_NURSING_FACILITY): Payer: Medicare Other | Admitting: Nurse Practitioner

## 2016-07-22 DIAGNOSIS — I10 Essential (primary) hypertension: Secondary | ICD-10-CM | POA: Diagnosis not present

## 2016-07-22 DIAGNOSIS — M353 Polymyalgia rheumatica: Secondary | ICD-10-CM

## 2016-07-22 DIAGNOSIS — M1990 Unspecified osteoarthritis, unspecified site: Secondary | ICD-10-CM

## 2016-07-22 DIAGNOSIS — M25542 Pain in joints of left hand: Secondary | ICD-10-CM | POA: Diagnosis not present

## 2016-07-22 NOTE — Assessment & Plan Note (Signed)
Controlled, continue Amlodipine 50mg , Hydralazi ne 50mg  tid, Furosemide 40mg  daily

## 2016-07-22 NOTE — Progress Notes (Signed)
Location:    [facility]ing Home Room Number: 56 Place of Service:  SNF (31) Provider:  Mast, Manxie  NP  Blanchie Serve, MD  Patient Care Team: Blanchie Serve, MD as PCP - General (Internal Medicine) Laurence Spates, MD as Consulting Physician (Gastroenterology) Hennie Duos, MD as Consulting Physician (Rheumatology) Corliss Parish, MD as Consulting Physician (Nephrology) Jettie Booze, MD as Consulting Physician (Cardiology) Glenna Fellows, MD as Attending Physician (Neurosurgery) Susa Day, MD as Consulting Physician (Orthopedic Surgery) Mast, Man X, NP as Nurse Practitioner (Internal Medicine)  Extended Emergency Contact Information Primary Emergency Contact: [facility] Address: [address]          [place], [place] [address] [name] of [place] Phone: [phone] Mobile Phone: [phone] Relation: Daughter Secondary Emergency Contact: Donnald Garre Address: [place], [place] Phone: [phone] Mobile Phone: [phone] Relation: Son  Code Status:  DNR Goals of care: Advanced Directive information Advanced Directives 07/23/2016  Does Patient Have a Medical Advance Directive? Yes  Type of Advance Directive Out of facility DNR (pink MOST or yellow form)  Does patient want to make changes to medical advance directive? No - Patient declined  Copy of [place] in Chart? -  Would patient like information on creating a medical advance directive? -  Pre-existing out of facility DNR order (yellow form or pink MOST form) Yellow form placed in chart (order not valid for inpatient use)     Chief Complaint  Patient presents with  . Acute Visit    Pain, swelling, redness of index finger on left hand    HPI:  Pt is a 83 y.o. female seen today for an acute visit for pain, swelling, redness, warmth of the left MCP joint, denied trauma, said she heard a "pop" sound last night, able to  make a fist with pain.    Hx of HTN, controlled, taking Amlodipine '5mg'$  qd, Hydralazine '50mg'$  tid, Furosemide '40mg'$  qd. Polymyalgia Rheumatic, chronic Prednisone use. Multiple sites osteoarthritis, prn Tramadol and Norco available to her.   Past Medical History:  Diagnosis Date  . Anxiety   . Aortic stenosis    a. Mild AS by echo 03/2013.  [place] Kitchen Arthritis   . Bilateral knee pain 06/10/2012  . Cardiomegaly 12/23/2014  . Cataract   . Chronic kidney disease    Stage III/IV  . Colitis, collagenous 07/11/2011  . Corticosteroid dependence ([place]) 12/17/2014  . DDD (degenerative disc disease), thoracolumbar 12/23/2014  . Debility 12/23/2014  . Diverticulitis   . Diverticulosis of colon without hemorrhage 12/23/2014  . Dysphagia 12/23/2014  . Edema 12/23/2014  . Effusion of left knee 12/23/2014  . Enteritis 12/17/2014  . Hiatal hernia 12/23/2014  . HNP (herniated nucleus pulposus), lumbar 12/23/2014   L 4-5   . Hyperlipidemia   . Hypertension   . Hypokalemia   . Hypothyroidism   . IBS (irritable bowel syndrome) 12/23/2014  . Irritable bowel   . Noninfected skin tear of leg 12/23/2014   Left leg   . Osteoarthritis of left knee 12/23/2014  . Osteoporosis   . PMR (polymyalgia rheumatica) (HCC)    a. On chronic prednisone.  . Polymyalgia rheumatica ([place]) 07/11/2011  . Premature atrial contractions    a. Noted during adm 03/2013, corresponding with patient's palpitations.  . Scoliosis (and kyphoscoliosis), idiopathic 12/23/2014  . Spinal stenosis   . Spinal stenosis of lumbar region 12/23/2014  . Symptomatic bradycardia  a. Adm 03/2013 - taken off clonidine and BB.  . T11 vertebral fracture ([place]) 12/23/2014  . Unstable gait 12/23/2014   Past Surgical History:  Procedure Laterality Date  . ABDOMINAL HYSTERECTOMY  1966  . APPENDECTOMY    . BACK SURGERY    . Breast biopsies    . CHOLECYSTECTOMY    . EYE SURGERY      Allergies  Allergen Reactions  . Codeine Nausea And Vomiting  . Fosamax  [Alendronate Sodium]     Unknown.   Dion Saucier [Calcitonin]     Unknown.  . Nsaids     Due to renal function.   . Quinolones Other (See Comments)    platelets  . Sulfa Antibiotics Nausea And Vomiting    Allergies as of 07/22/2016      Reactions   Codeine Nausea And Vomiting   Fosamax [alendronate Sodium]    Unknown.    Miacalcin [calcitonin]    Unknown.   Nsaids    Due to renal function.    Quinolones Other (See Comments)   platelets   Sulfa Antibiotics Nausea And Vomiting      Medication List       Accurate as of 07/22/16 11:59 PM. Always use your most recent med list.          ALPRAZolam 0.25 MG tablet Commonly known as:  XANAX Take 0.25 mg by mouth 4 (four) times daily.   amLODipine 5 MG tablet Commonly known as:  NORVASC Take 5 mg by mouth daily.   docusate sodium 100 MG capsule Commonly known as:  COLACE Take 100 mg by mouth 2 (two) times daily as needed for mild constipation.   furosemide 40 MG tablet Commonly known as:  LASIX Take 40 mg by mouth every morning.   hydrALAZINE 50 MG tablet Commonly known as:  APRESOLINE Take 1 tablet (50 mg total) by mouth 3 (three) times daily. (Every 8 hours)   HYDROcodone-acetaminophen 10-325 MG tablet Commonly known as:  NORCO Take 1 tablet by mouth every 6 (six) hours.   hydrocortisone cream 1 % Apply 1 application topically. Apply to inflamed anal area twice a day until healed   levothyroxine 50 MCG tablet Commonly known as:  SYNTHROID, LEVOTHROID Take 50 mcg by mouth daily.   omeprazole 20 MG capsule Commonly known as:  PRILOSEC Take 20 mg by mouth daily.   potassium chloride SA 20 MEQ tablet Commonly known as:  K-DUR,KLOR-CON Take 1 tablet (20 mEq total) by mouth 2 (two) times daily.   predniSONE 1 MG tablet Commonly known as:  DELTASONE Take 1 mg by mouth daily with breakfast. Take with prednisone '5mg'$    predniSONE 5 MG tablet Commonly known as:  DELTASONE Take 5 mg by mouth every morning. Take  with prednisone 1 mg   sertraline 50 MG tablet Commonly known as:  ZOLOFT Take 50 mg by mouth at bedtime. Take with 25 mg to equal a total of 75 mg.   traMADol 50 MG tablet Commonly known as:  ULTRAM Take 50 mg by mouth every 4 (four) hours as needed for moderate pain or severe pain.   Vitamin D 2000 units Caps Take 2,000 Units by mouth daily.       Review of Systems  Constitutional: Negative for activity change, appetite change, chills, diaphoresis, fatigue, fever and unexpected weight change.  HENT: Negative for congestion, ear discharge, ear pain, hearing loss, postnasal drip, rhinorrhea, sore throat, tinnitus, trouble swallowing and voice change.  Folliculitis right nostril, mild cellulitis in the area  Eyes: Positive for visual disturbance (corrective lenses). Negative for pain, redness and itching.  Respiratory: Negative for cough, choking, shortness of breath and wheezing.   Cardiovascular: Positive for leg swelling. Negative for chest pain and palpitations.  Gastrointestinal: Negative for abdominal distention, abdominal pain, constipation, diarrhea and nausea.  Endocrine: Negative for cold intolerance, heat intolerance, polydipsia, polyphagia and polyuria.  Genitourinary: Negative for difficulty urinating, dysuria, flank pain, frequency, hematuria, pelvic pain, urgency and vaginal discharge.  Musculoskeletal: Positive for arthralgias, back pain, gait problem and joint swelling (left hand and wrist. Both knees. Left elbow with bruising and restriction of movement.). Negative for myalgias, neck pain and neck stiffness.       Fx left radius at wrist-healed [place] 04/03/16, stated her walker rolled away from her, landed on the left side, c/o left hip and ankle pain, able to bear weight, no deformity noted, she takes Norco 10/'325mg'$  q6h.  Left elbow bruise, swelling, pain with ROM 04/15/16 c/o lateral left knee pain, worse with weight bearing.  05/23/16 positional right groin pain,  able to bear weight and ambulate with walker.  07/22/16 left 2nd MCP joint pain, swelling, redness, warmth  Skin: Positive for wound (Lateral right lower leg open wound, no s/s of infection. Distal right forearm skin tear, proximate with steri strips, healing nicely.). Negative for color change, pallor and rash.       Right lower leg  Allergic/Immunologic: Negative.   Neurological: Positive for weakness and numbness (left hand). Negative for dizziness, tremors, seizures, syncope and headaches.  Hematological: Negative for adenopathy. Does not bruise/bleed easily.  Psychiatric/Behavioral: Positive for dysphoric mood. Negative for agitation, behavioral problems, confusion, hallucinations, sleep disturbance and suicidal ideas. The patient is not nervous/anxious and is not hyperactive.     Immunization History  Administered Date(s) Administered  . Influenza-Unspecified 11/01/2013  . Pneumococcal Polysaccharide-23 03/22/2004  . Tdap 11/21/2011  . Zoster 06/22/2009   Pertinent  Health Maintenance Due  Topic Date Due  . PNA vac Low Risk Adult (2 of 2 - PCV13) 01/14/2017 (Originally 03/22/2005)  . DEXA SCAN  07/04/2017 (Originally 05/07/1984)  . INFLUENZA VACCINE  08/14/2016   Fall Risk  01/17/2016  Falls in the past year? Yes  Number falls in past yr: 2 or more   Functional Status Survey:    Vitals:   07/23/16 1520  BP: 118/64  Pulse: 74  Resp: 18  Temp: (!) 97.4 F (36.3 C)  Weight: 134 lb 1.6 oz (60.8 kg)  Height: '5\' 4"'$  (1.626 m)   Body mass index is 23.02 kg/m. Physical Exam  Constitutional: She is oriented to person, place, and time. She appears well-developed and well-nourished. No distress.  Overweight  HENT:  Right Ear: External ear normal.  Left Ear: External ear normal.  Nose: Nose normal.  Mouth/Throat: Oropharynx is clear and moist. No oropharyngeal exudate.  Eyes: Conjunctivae and EOM are normal. Pupils are equal, round, and reactive to light. No scleral icterus.    Neck: No JVD present. No tracheal deviation present. No thyromegaly present.  Cardiovascular: Normal rate, regular rhythm and intact distal pulses.  Exam reveals no gallop and no friction rub.   Murmur heard. Systolic murmur 4-1/6 the right sternal border.   Pulmonary/Chest: Effort normal. No respiratory distress. She has no wheezes. She has no rales. She exhibits no tenderness.  Abdominal: She exhibits no distension and no mass. There is no tenderness.  Genitourinary:  Genitourinary Comments: External hemorrhoids.   Musculoskeletal:  Normal range of motion. She exhibits edema and tenderness.  Generalized weakness. Left knee effusion. Pain left knee>right knee, lower back, left wrist.  c/o left hip and ankle pain 04/03/16 since the fall in am, able to bear weight, no noted deformity.  Chronic edema BLE L>R 07/22/16 the left 2nd MCP joint pain, swelling, redness, warmth.   Lymphadenopathy:    She has no cervical adenopathy.  Neurological: She is alert and oriented to person, place, and time. No cranial nerve deficit. Coordination normal.  Skin: No rash noted. She is not diaphoretic. No erythema. No pallor.  Lateral right lower leg open wound, no s/s of infection. Distal right forearm skin tear, proximate with steri strips, healing nicely.  Left elbow swelling, pain with ROM, a large bruise.    Psychiatric: Her behavior is normal. Judgment and thought content normal.  Frustrated and depressed about her current situation.    Labs reviewed:  Recent Labs  01/17/16 1440 02/13/16 05/28/16 07/23/16  NA 141 139 144 142  K 4.1 3.7 3.5 3.6  CL 104  --   --   --   CO2 28  --   --   --   GLUCOSE 119*  --   --   --   BUN 36* 33* 39* 42*  CREATININE 1.47* 1.4* 1.6* 1.5*  CALCIUM 9.1  --   --   --     Recent Labs  02/13/16 05/28/16 07/23/16  AST '14 15 15  '$ ALT '9 8 10  '$ ALKPHOS 75 116 92    Recent Labs  01/17/16 1440 02/13/16 05/28/16 07/23/16  WBC 9.9 7.2 7.5 6.0  NEUTROABS 8.1*  --   --    --   HGB 11.9* 11.4* 11.5* 11.5*  HCT 36.8 35* 35* 34*  MCV 87.0  --   --   --   PLT 199 144* 172 146*   Lab Results  Component Value Date   TSH 3.53 02/13/2016   No results found for: HGBA1C No results found for: CHOL, HDL, LDLCALC, LDLDIRECT, TRIG, CHOLHDL  Significant Diagnostic Results in last 30 days:  No results found.  Assessment/Plan Pain in joint of left hand Pain, swelling, heat, redness left MCP joint, denied injury, but she remember there was a "pop" sound last night, no history of similar presentation, will obtain CBC CMP uric acid, ESR X-ray L 2nd finger/MCP joint(AP and lateral views). Apply Lidoderm patch on 12hr and off 12 hr. Prn Tramadol and Norco available to her. Observe.   HTN (hypertension) Controlled, continue Amlodipine '50mg'$ , Hydralazi ne '50mg'$  tid, Furosemide '40mg'$  daily  Polymyalgia rheumatica (HCC) Continue Prednisone '1mg'$  with breakfast, '5mg'$  at 10am.  Osteoarthritis (arthritis due to wear and tear of joints) Multiple sites, continue supportive care, prn Tramadol and Norco available to her.      Family/ staff Communication: SNF  Labs/tests ordered: CBC CMP uric acid, ESR X-ray L 2nd finger/MCP joint(AP and lateral views)

## 2016-07-22 NOTE — Assessment & Plan Note (Signed)
Continue Prednisone 1mg  with breakfast, 5mg  at 10am.

## 2016-07-22 NOTE — Assessment & Plan Note (Addendum)
Pain, swelling, heat, redness left MCP joint, denied injury, but she remember there was a "pop" sound last night, no history of similar presentation, will obtain CBC CMP uric acid, ESR X-ray L 2nd finger/MCP joint(AP and lateral views). Apply Lidoderm patch on 12hr and off 12 hr. Prn Tramadol and Norco available to her. Observe.

## 2016-07-22 NOTE — Assessment & Plan Note (Signed)
Multiple sites, continue supportive care, prn Tramadol and Norco available to her.

## 2016-07-23 ENCOUNTER — Other Ambulatory Visit: Payer: Self-pay | Admitting: *Deleted

## 2016-07-23 ENCOUNTER — Encounter: Payer: Self-pay | Admitting: Nurse Practitioner

## 2016-07-23 DIAGNOSIS — G8929 Other chronic pain: Secondary | ICD-10-CM | POA: Diagnosis not present

## 2016-07-23 DIAGNOSIS — R29898 Other symptoms and signs involving the musculoskeletal system: Secondary | ICD-10-CM | POA: Diagnosis not present

## 2016-07-23 DIAGNOSIS — M79645 Pain in left finger(s): Secondary | ICD-10-CM | POA: Diagnosis not present

## 2016-07-23 LAB — HEPATIC FUNCTION PANEL
ALK PHOS: 92 (ref 25–125)
ALT: 10 (ref 7–35)
AST: 15 (ref 13–35)
Bilirubin, Total: 0.3

## 2016-07-23 LAB — BASIC METABOLIC PANEL
BUN: 42 — AB (ref 4–21)
Creatinine: 1.5 — AB (ref <=1.1)
Glucose: 81
Potassium: 3.6 (ref 3.4–5.3)
SODIUM: 142 (ref 137–147)

## 2016-07-23 LAB — CBC AND DIFFERENTIAL
HEMATOCRIT: 34 — AB (ref 36–46)
HEMOGLOBIN: 11.5 — AB (ref 12.0–16.0)
Platelets: 146 — AB (ref 150–399)
WBC: 6

## 2016-08-13 ENCOUNTER — Non-Acute Institutional Stay (SKILLED_NURSING_FACILITY): Payer: Medicare Other | Admitting: Nurse Practitioner

## 2016-08-13 ENCOUNTER — Encounter: Payer: Self-pay | Admitting: Nurse Practitioner

## 2016-08-13 DIAGNOSIS — N3942 Incontinence without sensory awareness: Secondary | ICD-10-CM | POA: Diagnosis not present

## 2016-08-13 DIAGNOSIS — R609 Edema, unspecified: Secondary | ICD-10-CM | POA: Diagnosis not present

## 2016-08-13 DIAGNOSIS — S81812A Laceration without foreign body, left lower leg, initial encounter: Secondary | ICD-10-CM | POA: Diagnosis not present

## 2016-08-13 NOTE — Progress Notes (Signed)
Location:  [facility] Room Number: 33 Place of Service:  SNF (31) Provider:  Andreea Arca, Manxie  NP  Blanchie Serve, MD  Patient Care Team: Blanchie Serve, MD as PCP - General (Internal Medicine) Laurence Spates, MD as Consulting Physician (Gastroenterology) Hennie Duos, MD as Consulting Physician (Rheumatology) Corliss Parish, MD as Consulting Physician (Nephrology) Jettie Booze, MD as Consulting Physician (Cardiology) Glenna Fellows, MD as Attending Physician (Neurosurgery) Susa Day, MD as Consulting Physician (Orthopedic Surgery) Bettye Sitton X, NP as Nurse Practitioner (Internal Medicine)  Extended Emergency Contact Information Primary Emergency Contact: [facility] Address: [address]          [place], [place] [address] [name] of [place] Phone: [phone] Mobile Phone: [phone] Relation: Daughter Secondary Emergency Contact: Donnald Garre Address: [place], [place] Phone: [phone] Mobile Phone: [phone] Relation: Son  Code Status:  DNR Goals of care: Advanced Directive information Advanced Directives 08/13/2016  Does Patient Have a Medical Advance Directive? Yes  Type of Advance Directive Out of facility DNR (pink MOST or yellow form)  Does patient want to make changes to medical advance directive? No - Patient declined  Copy of [place] in Chart? -  Would patient like information on creating a medical advance directive? -  Pre-existing out of facility DNR order (yellow form or pink MOST form) Yellow form placed in chart (order not valid for inpatient use)     Chief Complaint  Patient presents with  . Acute Visit    Patient bruising easily, want padding for wheel chair.    HPI:  Pt is a 83 y.o. female seen today for an acute visit for a skin tear lower left lower leg when she pumped into furniture, well approximated with steri strips  x3, cover with non adhesive dressing, no s/s of infection. She has chronic edema in legs, taking Furosemide 40mg  qd.   C/o dysuria for 2 days, admitted sometimes urinary incontinent, denied nausea, vomiting, abd pain, CVA pain, appetite remains the same, she is afebrile.     Past Medical History:  Diagnosis Date  . Anxiety   . Aortic stenosis    a. Mild AS by echo 03/2013.  Marland Kitchen Arthritis   . Bilateral knee pain 06/10/2012  . Cardiomegaly 12/23/2014  . Cataract   . Chronic kidney disease    Stage III/IV  . Colitis, collagenous 07/11/2011  . Corticosteroid dependence ([place]) 12/17/2014  . DDD (degenerative disc disease), thoracolumbar 12/23/2014  . Debility 12/23/2014  . Diverticulitis   . Diverticulosis of colon without hemorrhage 12/23/2014  . Dysphagia 12/23/2014  . Edema 12/23/2014  . Effusion of left knee 12/23/2014  . Enteritis 12/17/2014  . Hiatal hernia 12/23/2014  . HNP (herniated nucleus pulposus), lumbar 12/23/2014   L 4-5   . Hyperlipidemia   . Hypertension   . Hypokalemia   . Hypothyroidism   . IBS (irritable bowel syndrome) 12/23/2014  . Irritable bowel   . Noninfected skin tear of leg 12/23/2014   Left leg   . Osteoarthritis of left knee 12/23/2014  . Osteoporosis   . PMR (polymyalgia rheumatica) ([facility])    a. On chronic prednisone.  . Polymyalgia rheumatica ([place]) 07/11/2011  . Premature atrial contractions    a. Noted during adm 03/2013, corresponding with patient's palpitations.  . Scoliosis (and kyphoscoliosis), idiopathic 12/23/2014  . Spinal stenosis   . Spinal stenosis of lumbar region 12/23/2014  .  Symptomatic bradycardia    a. Adm 03/2013 - taken off clonidine and BB.  . T11 vertebral fracture ([place]) 12/23/2014  . Unstable gait 12/23/2014   Past Surgical History:  Procedure Laterality Date  . ABDOMINAL HYSTERECTOMY  1966  . APPENDECTOMY    . BACK SURGERY    . Breast biopsies    . CHOLECYSTECTOMY    . EYE SURGERY      Allergies  Allergen Reactions  . Codeine  Nausea And Vomiting  . Fosamax [Alendronate Sodium]     Unknown.   Dion Saucier [Calcitonin]     Unknown.  . Nsaids     Due to renal function.   . Quinolones Other (See Comments)    platelets  . Sulfa Antibiotics Nausea And Vomiting    Outpatient Encounter Prescriptions as of 08/13/2016  Medication Sig  . ALPRAZolam (XANAX) 0.25 MG tablet Take 0.25 mg by mouth 4 (four) times daily.  Marland Kitchen amLODipine (NORVASC) 5 MG tablet Take 5 mg by mouth daily.  . Cholecalciferol (VITAMIN D) 2000 UNITS CAPS Take 2,000 Units by mouth daily.  Marland Kitchen docusate sodium (COLACE) 100 MG capsule Take 100 mg by mouth 2 (two) times daily as needed for mild constipation.  . furosemide (LASIX) 40 MG tablet Take 40 mg by mouth every morning.   . hydrALAZINE (APRESOLINE) 50 MG tablet Take 1 tablet (50 mg total) by mouth 3 (three) times daily. (Every 8 hours)  . HYDROcodone-acetaminophen (NORCO) 10-325 MG tablet Take 1 tablet by mouth every 6 (six) hours.   . hydrocortisone cream 1 % Apply 1 application topically. Apply to inflamed anal area twice a day until healed  . levothyroxine (SYNTHROID, LEVOTHROID) 50 MCG tablet Take 50 mcg by mouth daily.   Marland Kitchen omeprazole (PRILOSEC) 20 MG capsule Take 20 mg by mouth daily.  . potassium chloride SA (K-DUR,KLOR-CON) 20 MEQ tablet Take 1 tablet (20 mEq total) by mouth 2 (two) times daily.  . predniSONE (DELTASONE) 1 MG tablet Take 1 mg by mouth daily with breakfast. Take with prednisone 5mg   . predniSONE (DELTASONE) 5 MG tablet Take 5 mg by mouth every morning. Take with prednisone 1 mg  . sertraline (ZOLOFT) 50 MG tablet Take 50 mg by mouth at bedtime. Take with 25 mg to equal a total of 75 mg.  . traMADol (ULTRAM) 50 MG tablet Take 50 mg by mouth every 4 (four) hours as needed for moderate pain or severe pain.   No facility-administered encounter medications on file as of 08/13/2016.     Review of Systems  Constitutional: Negative for activity change, appetite change, fatigue and  fever.  HENT: Negative for congestion, hearing loss, tinnitus, trouble swallowing and voice change.        Folliculitis right nostril, mild cellulitis in the area  Eyes: Negative for visual disturbance (corrective lenses).  Respiratory: Negative for cough, choking and shortness of breath.   Cardiovascular: Positive for leg swelling. Negative for chest pain and palpitations.       Chronic BLE edema  Gastrointestinal: Negative for abdominal distention and abdominal pain.  Genitourinary: Positive for dysuria. Negative for difficulty urinating and frequency.  Musculoskeletal: Positive for arthralgias, back pain, gait problem and joint swelling (left hand and wrist. Both knees. Left elbow with bruising and restriction of movement.).       Fx left radius at wrist-healed [place] 04/03/16, stated her walker rolled away from her, landed on the left side, c/o left hip and ankle pain, able to bear weight, no deformity  noted, she takes Norco 10/325mg  q6h.  Left elbow bruise, swelling, pain with ROM 04/15/16 c/o lateral left knee pain, worse with weight bearing.  05/23/16 positional right groin pain, able to bear weight and ambulate with walker.  07/22/16 left 2nd MCP joint pain, swelling, redness, warmth-resolved  Skin: Positive for wound (Lateral right lower leg open wound, no s/s of infection. Distal right forearm skin tear, proximate with steri strips, healing nicely.). Negative for pallor and rash.       Skin tear lower left lower leg, well approximated with steri strips x3, cover with non adhesive dressing, no s/s of infection.   Allergic/Immunologic: Negative.   Neurological: Positive for weakness and numbness (left hand). Negative for dizziness.  Psychiatric/Behavioral: Negative for agitation and behavioral problems.    Immunization History  Administered Date(s) Administered  . Influenza-Unspecified 11/01/2013  . Pneumococcal Polysaccharide-23 03/22/2004  . Tdap 11/21/2011  . Zoster 06/22/2009    Pertinent  Health Maintenance Due  Topic Date Due  . INFLUENZA VACCINE  08/14/2016  . PNA vac Low Risk Adult (2 of 2 - PCV13) 01/14/2017 (Originally 03/22/2005)  . DEXA SCAN  07/04/2017 (Originally 05/07/1984)   Fall Risk  01/17/2016  Falls in the past year? Yes  Number falls in past yr: 2 or more   Functional Status Survey:    Vitals:   08/13/16 0935  BP: (!) 158/72  Pulse: 75  Resp: 18  Temp: 97.6 F (36.4 C)  Weight: 134 lb 1.6 oz (60.8 kg)  Height: 5\' 4"  (1.626 m)   Body mass index is 23.02 kg/m. Physical Exam  Constitutional: She is oriented to person, place, and time. She appears well-developed and well-nourished. No distress.  Overweight  HENT:  Right Ear: External ear normal.  Left Ear: External ear normal.  Nose: Nose normal.  Mouth/Throat: Oropharynx is clear and moist. No oropharyngeal exudate.  Eyes: Pupils are equal, round, and reactive to light. Conjunctivae and EOM are normal. No scleral icterus.  Neck: No JVD present. No tracheal deviation present. No thyromegaly present.  Cardiovascular: Normal rate, regular rhythm and intact distal pulses.  Exam reveals no gallop and no friction rub.   Murmur heard. Systolic murmur 3-7/1 the right sternal border.   Pulmonary/Chest: Effort normal. No respiratory distress. She has no wheezes. She has no rales. She exhibits no tenderness.  Abdominal: She exhibits no distension and no mass. There is no tenderness.  Genitourinary:  Genitourinary Comments: External hemorrhoids.   Musculoskeletal: Normal range of motion. She exhibits edema and tenderness.  Generalized weakness. Left knee effusion. Pain left knee>right knee, lower back, left wrist.  c/o left hip and ankle pain 04/03/16 since the fall in am, able to bear weight, no noted deformity.  Chronic edema BLE L>R  Lymphadenopathy:    She has no cervical adenopathy.  Neurological: She is alert and oriented to person, place, and time. No cranial nerve deficit. Coordination  normal.  Skin: Skin is warm and dry. No rash noted. She is not diaphoretic. No erythema.  Skin tear lower left lower leg, well approximated with steri strips x3, cover with non adhesive dressing, no s/s of infection.    Psychiatric: She has a normal mood and affect. Her behavior is normal. Judgment and thought content normal.    Labs reviewed:  Recent Labs  01/17/16 1440 02/13/16 05/28/16 07/23/16  NA 141 139 144 142  K 4.1 3.7 3.5 3.6  CL 104  --   --   --   CO2 28  --   --   --  GLUCOSE 119*  --   --   --   BUN 36* 33* 39* 42*  CREATININE 1.47* 1.4* 1.6* 1.5*  CALCIUM 9.1  --   --   --     Recent Labs  02/13/16 05/28/16 07/23/16  AST 14 15 15   ALT 9 8 10   ALKPHOS 75 116 92    Recent Labs  01/17/16 1440 02/13/16 05/28/16 07/23/16  WBC 9.9 7.2 7.5 6.0  NEUTROABS 8.1*  --   --   --   HGB 11.9* 11.4* 11.5* 11.5*  HCT 36.8 35* 35* 34*  MCV 87.0  --   --   --   PLT 199 144* 172 146*   Lab Results  Component Value Date   TSH 3.53 02/13/2016   No results found for: HGBA1C No results found for: CHOL, HDL, LDLCALC, LDLDIRECT, TRIG, CHOLHDL  Significant Diagnostic Results in last 30 days:  No results found.  Assessment/Plan Noninfected skin tear of left leg, initial encounter a skin tear lower left lower leg when she pumped into furniture, well approximated with steri strips x3, cover with non adhesive dressing, no s/s of infection.  It should heal, observe.   Incontinent of urine C/o dysuria for 2 days, admitted sometimes urinary incontinent, denied nausea, vomiting, abd pain, CVA pain, appetite remains the same, she is afebrile.  Obtain UA C/S to r/o UTI in setting of c/o dysuria.   Edema Trace edema BLE, L>R, continue Furosemide 40mg , this may delay the left lower leg skin tear healing. Observe.      Family/ staff Communication: SNF  Labs/tests ordered:  UA C/S

## 2016-08-14 DIAGNOSIS — R32 Unspecified urinary incontinence: Secondary | ICD-10-CM | POA: Insufficient documentation

## 2016-08-14 DIAGNOSIS — N390 Urinary tract infection, site not specified: Secondary | ICD-10-CM | POA: Diagnosis not present

## 2016-08-14 NOTE — Assessment & Plan Note (Signed)
Trace edema BLE, L>R, continue Furosemide 40mg , this may delay the left lower leg skin tear healing. Observe.

## 2016-08-14 NOTE — Assessment & Plan Note (Signed)
a skin tear lower left lower leg when she pumped into furniture, well approximated with steri strips x3, cover with non adhesive dressing, no s/s of infection.  It should heal, observe.

## 2016-08-14 NOTE — Assessment & Plan Note (Signed)
C/o dysuria for 2 days, admitted sometimes urinary incontinent, denied nausea, vomiting, abd pain, CVA pain, appetite remains the same, she is afebrile.  Obtain UA C/S to r/o UTI in setting of c/o dysuria.

## 2016-08-16 ENCOUNTER — Encounter: Payer: Self-pay | Admitting: Nurse Practitioner

## 2016-08-16 DIAGNOSIS — N390 Urinary tract infection, site not specified: Secondary | ICD-10-CM | POA: Insufficient documentation

## 2016-08-22 ENCOUNTER — Non-Acute Institutional Stay (SKILLED_NURSING_FACILITY): Payer: Medicare Other | Admitting: Internal Medicine

## 2016-08-22 ENCOUNTER — Encounter: Payer: Self-pay | Admitting: Internal Medicine

## 2016-08-22 DIAGNOSIS — M5442 Lumbago with sciatica, left side: Secondary | ICD-10-CM

## 2016-08-22 DIAGNOSIS — R0609 Other forms of dyspnea: Secondary | ICD-10-CM | POA: Diagnosis not present

## 2016-08-22 DIAGNOSIS — I10 Essential (primary) hypertension: Secondary | ICD-10-CM

## 2016-08-22 DIAGNOSIS — M353 Polymyalgia rheumatica: Secondary | ICD-10-CM

## 2016-08-22 DIAGNOSIS — M5441 Lumbago with sciatica, right side: Secondary | ICD-10-CM

## 2016-08-22 DIAGNOSIS — F411 Generalized anxiety disorder: Secondary | ICD-10-CM

## 2016-08-22 DIAGNOSIS — E039 Hypothyroidism, unspecified: Secondary | ICD-10-CM

## 2016-08-22 DIAGNOSIS — R600 Localized edema: Secondary | ICD-10-CM | POA: Diagnosis not present

## 2016-08-22 DIAGNOSIS — G8929 Other chronic pain: Secondary | ICD-10-CM | POA: Diagnosis not present

## 2016-08-22 NOTE — Progress Notes (Signed)
Location:  [facility] Room Number: 73 Place of Service:  SNF [phone]) Provider:  Briceson Broadwater Molly Maduro, Daphane Odekirk MD  Patient Care Team: Blanchie Serve, MD as PCP - General (Internal Medicine) Laurence Spates, MD as Consulting Physician (Gastroenterology) Hennie Duos, MD as Consulting Physician (Rheumatology) Corliss Parish, MD as Consulting Physician (Nephrology) Jettie Booze, MD as Consulting Physician (Cardiology) Glenna Fellows, MD as Attending Physician (Neurosurgery) Susa Day, MD as Consulting Physician (Orthopedic Surgery) Mast, Man X, NP as Nurse Practitioner (Internal Medicine)  Extended Emergency Contact Information Primary Emergency Contact: [facility] Address: [address]          [place], [place] [address] [name] of [place] Phone: [phone] Mobile Phone: [phone] Relation: Daughter Secondary Emergency Contact: Donnald Garre Address: [place], [place] Phone: [phone] Mobile Phone: [phone] Relation: Son  Code Status:  DNR Goals of care: Advanced Directive information Advanced Directives 08/13/2016  Does Patient Have a Medical Advance Directive? Yes  Type of Advance Directive Out of facility DNR (pink MOST or yellow form)  Does patient want to make changes to medical advance directive? No - Patient declined  Copy of [place] in Chart? -  Would patient like information on creating a medical advance directive? -  Pre-existing out of facility DNR order (yellow form or pink MOST form) Yellow form placed in chart (order not valid for inpatient use)     Chief Complaint  Patient presents with  . Medical Management of Chronic Issues    Routine Visit     HPI:  Pt is a 83 y.o. female seen today for medical management of chronic diseases. She is seen in her room today with her daughter at bedside. She complaints of generalized ache  and pain. Her pain to her legs have worsened. Pain overall is more prominent on left side.This has further limited her mobility. She now uses wheelchair for all transfer. Pain medication is not helping her. Of note, she has PMR. She has noticed increased swelling to her legs. She hit her right leg on wheelchair and sustained skin tear on 08/10/16. She has been at her baseline otherwise.  Past Medical History:  Diagnosis Date  . Anxiety   . Aortic stenosis    a. Mild AS by echo 03/2013.  Marland Kitchen Arthritis   . Bilateral knee pain 06/10/2012  . Cardiomegaly 12/23/2014  . Cataract   . Chronic kidney disease    Stage III/IV  . Colitis, collagenous 07/11/2011  . Corticosteroid dependence ([place]) 12/17/2014  . DDD (degenerative disc disease), thoracolumbar 12/23/2014  . Debility 12/23/2014  . Diverticulitis   . Diverticulosis of colon without hemorrhage 12/23/2014  . Dysphagia 12/23/2014  . Edema 12/23/2014  . Effusion of left knee 12/23/2014  . Enteritis 12/17/2014  . Hiatal hernia 12/23/2014  . HNP (herniated nucleus pulposus), lumbar 12/23/2014   L 4-5   . Hyperlipidemia   . Hypertension   . Hypokalemia   . Hypothyroidism   . IBS (irritable bowel syndrome) 12/23/2014  . Irritable bowel   . Noninfected skin tear of leg 12/23/2014   Left leg   . Osteoarthritis of left knee 12/23/2014  . Osteoporosis   . PMR (polymyalgia rheumatica) (HCC)    a. On chronic prednisone.  . Polymyalgia rheumatica ([place]) 07/11/2011  . Premature atrial contractions    a. Noted during adm 03/2013, corresponding with patient's palpitations.  Marland Kitchen  Scoliosis (and kyphoscoliosis), idiopathic 12/23/2014  . Spinal stenosis   . Spinal stenosis of lumbar region 12/23/2014  . Symptomatic bradycardia    a. Adm 03/2013 - taken off clonidine and BB.  . T11 vertebral fracture ([place]) 12/23/2014  . Unstable gait 12/23/2014   Past Surgical History:  Procedure Laterality Date  . ABDOMINAL HYSTERECTOMY  1966  . APPENDECTOMY    . BACK SURGERY    .  Breast biopsies    . CHOLECYSTECTOMY    . EYE SURGERY      Allergies  Allergen Reactions  . Codeine Nausea And Vomiting  . Fosamax [Alendronate Sodium]     Unknown.   Dion Saucier [Calcitonin]     Unknown.  . Nsaids     Due to renal function.   . Quinolones Other (See Comments)    platelets  . Sulfa Antibiotics Nausea And Vomiting    Outpatient Encounter Prescriptions as of 08/22/2016  Medication Sig  . ALPRAZolam (XANAX) 0.25 MG tablet Take 0.25 mg by mouth 4 (four) times daily.  Marland Kitchen amLODipine (NORVASC) 5 MG tablet Take 5 mg by mouth daily.  . Cholecalciferol (VITAMIN D) 2000 UNITS CAPS Take 2,000 Units by mouth daily.  Marland Kitchen docusate sodium (COLACE) 100 MG capsule Take 100 mg by mouth 2 (two) times daily as needed for mild constipation.  . furosemide (LASIX) 40 MG tablet Take 40 mg by mouth every morning.   . hydrALAZINE (APRESOLINE) 50 MG tablet Take 1 tablet (50 mg total) by mouth 3 (three) times daily. (Every 8 hours)  . HYDROcodone-acetaminophen (NORCO) 10-325 MG tablet Take 1 tablet by mouth every 6 (six) hours.   . hydrocortisone cream 1 % Apply 1 application topically. Apply to inflamed anal area twice a day until healed  . levothyroxine (SYNTHROID, LEVOTHROID) 50 MCG tablet Take 50 mcg by mouth daily.   . magnesium hydroxide (MILK OF MAGNESIA) 400 MG/5ML suspension Take 30 mLs by mouth as needed for mild constipation.  . nitrofurantoin, macrocrystal-monohydrate, (MACROBID) 100 MG capsule Take 100 mg by mouth 2 (two) times daily. Stop date 08/22/16  . omeprazole (PRILOSEC) 20 MG capsule Take 20 mg by mouth daily.  . potassium chloride SA (K-DUR,KLOR-CON) 20 MEQ tablet Take 1 tablet (20 mEq total) by mouth 2 (two) times daily.  . predniSONE (DELTASONE) 1 MG tablet Take 1 mg by mouth daily with breakfast.   . predniSONE (DELTASONE) 5 MG tablet Take 5 mg by mouth every morning.   . saccharomyces boulardii (FLORASTOR) 250 MG capsule Take 250 mg by mouth 2 (two) times daily. Stop date  08/25/16  . sertraline (ZOLOFT) 50 MG tablet Take 50 mg by mouth at bedtime. Take with 25 mg to equal a total of 75 mg.  . traMADol (ULTRAM) 50 MG tablet Take 50 mg by mouth every 4 (four) hours as needed for moderate pain or severe pain.   No facility-administered encounter medications on file as of 08/22/2016.     Review of Systems  Constitutional: Negative for appetite change, chills, diaphoresis, fatigue and fever.       She feeds herself. She is compliant with her medications  HENT: Positive for hearing loss. Negative for congestion, mouth sores and rhinorrhea.   Eyes: Negative for visual disturbance.       Has corrective glasses  Respiratory: Positive for shortness of breath. Negative for cough and wheezing.        Dyspnea with exertion, progressively worsening  Cardiovascular: Positive for leg swelling. Negative for chest pain  and palpitations.  Gastrointestinal: Negative for abdominal pain, blood in stool, constipation, diarrhea, nausea and vomiting.  Genitourinary: Negative for dysuria.       She is continent with her bowel and bladder  Musculoskeletal: Positive for arthralgias, back pain and gait problem. Negative for joint swelling.  Skin: Negative for rash.  Neurological: Positive for numbness. Negative for dizziness, seizures and headaches.       Has numbness and tingling intermittently to left forearm and hand  Hematological: Bruises/bleeds easily.  Psychiatric/Behavioral: Negative for agitation and behavioral problems. The patient is not nervous/anxious.     Immunization History  Administered Date(s) Administered  . Influenza-Unspecified 11/01/2013  . Pneumococcal Polysaccharide-23 03/22/2004  . Tdap 11/21/2011  . Zoster 06/22/2009   Pertinent  Health Maintenance Due  Topic Date Due  . INFLUENZA VACCINE  10/14/2016 (Originally 08/14/2016)  . PNA vac Low Risk Adult (2 of 2 - PCV13) 01/14/2017 (Originally 03/22/2005)  . DEXA SCAN  07/04/2017 (Originally 05/07/1984)   Fall  Risk  01/17/2016  Falls in the past year? Yes  Number falls in past yr: 2 or more   Functional Status Survey:    Vitals:   08/22/16 1241  BP: 121/70  Pulse: 68  Resp: 18  Temp: 97.6 F (36.4 C)  TempSrc: Oral  Weight: 138 lb 6.4 oz (62.8 kg)  Height: _0  (1.626 m)   Body mass index is 23.76 kg/m.   Wt Readings from Last 3 Encounters:  08/22/16 138 lb 6.4 oz (62.8 kg)  08/13/16 134 lb 1.6 oz (60.8 kg)  07/23/16 134 lb 1.6 oz (60.8 kg)   Physical Exam  Constitutional: She is oriented to person, place, and time. She appears well-developed and well-nourished. No distress.  HENT:  Head: Normocephalic and atraumatic.  Eyes: Pupils are equal, round, and reactive to light.  Neck: Normal range of motion. Neck supple.  Cardiovascular: Normal rate and regular rhythm.   Murmur heard. Systolic murmur  Pulmonary/Chest: Effort normal. No respiratory distress. She has no wheezes. She has no rales. She exhibits no tenderness.  Short of breath between sentences and has decreased air entry to lung bases.   Abdominal: Soft. She exhibits no distension. There is no tenderness. There is no guarding.  Musculoskeletal: She exhibits edema.  Limited range of motion to both shoulder right > left, severe arthritis changes to her fingers, ROM left wrist limited, chronic back pain, 1+ pitting leg edema  Lymphadenopathy:    She has no cervical adenopathy.  Neurological: She is alert and oriented to person, place, and time.  Skin: Skin is warm and dry. She is not diaphoretic.  Easy bruising, senile purpura present, chronic skin changes to lower legs, skin tear to LLE with steri-strip  Psychiatric: She has a normal mood and affect. Her behavior is normal.    Labs reviewed:  Recent Labs  01/17/16 1440 02/13/16 05/28/16 07/23/16  NA 141 139 144 142  K 4.1 3.7 3.5 3.6  CL 104  --   --   --   CO2 28  --   --   --   GLUCOSE 119*  --   --   --   BUN 36* 33* 39* 42*  CREATININE 1.47* 1.4* 1.6* 1.5*   CALCIUM 9.1  --   --   --     Recent Labs  02/13/16 05/28/16 07/23/16  AST _1 ALT _2 ALKPHOS 75 116 92    Recent Labs  01/17/16 1440 02/13/16  05/28/16 07/23/16  WBC 9.9 7.2 7.5 6.0  NEUTROABS 8.1*  --   --   --   HGB 11.9* 11.4* 11.5* 11.5*  HCT 36.8 35* 35* 34*  MCV 87.0  --   --   --   PLT 199 144* 172 146*   Lab Results  Component Value Date   TSH 3.53 02/13/2016   No results found for: HGBA1C No results found for: CHOL, HDL, LDLCALC, LDLDIRECT, TRIG, CHOLHDL  Significant Diagnostic Results in last 30 days:  No results found.  Assessment/Plan  Dyspnea Has history of cardiomegaly and aortic stenosis. Possible progression of aortic stenosis. Possible deconditioning. No documented history of CHF. Echocardiogram 04/03/13 with EF 65-70% but given her age, likely has some diastolic dysfunction. Check BNP  PMR Check ESR, start prednisone 40 mg daily x 5 days and then 5 mg daily after ESR has been sent.  Chronic back pain Continue norco 10-325 mg qid with tramadol 50 mg q4h prn pain. Add lidocaine patch 5%. Palliative care consult for pain management  Leg edema Increased. Also has gained weight. Concern for PVD given chronic skin changes. No signs of infection. keep legs elevated at rest. Add ted hose. Change lasix to 40 mg bid and kcl 40 meq bid. Check bmp in 1 week with ckd stage 3  GAD Stable with current regimen of sertraline 75 mg qhs and alprazolam 0.25 mg qid. Monitor clinically  Essential HTN Currently on amlodipine 5 mg daily, furosemide 40 mg daily and hydralazine 50 mg tid. Reviewed BP reading that is stable. D/c amlodipine and monitor  Hypothyroidism Lab Results  Component Value Date   TSH 3.53 02/13/2016   Check TSH. continue levothyrxoine 50 mcg daily   Family/ staff Communication: reviewed care plan with patient, her daughter and charge nurse.    Labs/tests ordered:  TSH, ESR, BNP, BMP   I spent 40 minutes in total face-to-face  time with the patient, more than 50% of which was spent in counseling and coordination of care, reviewing test results, reviewing medication and discussing or reviewing the diagnosis with patient.      Blanchie Serve, MD Internal Medicine [facility] [address] [place], [place] [address] Cell Phone (Monday-Friday 8 am - 5 pm): [phone] On Call: [phone] and follow prompts after 5 pm and on weekends Office Phone: [phone] Office Fax: [phone]

## 2016-08-27 DIAGNOSIS — I10 Essential (primary) hypertension: Secondary | ICD-10-CM | POA: Diagnosis not present

## 2016-08-27 DIAGNOSIS — E039 Hypothyroidism, unspecified: Secondary | ICD-10-CM | POA: Diagnosis not present

## 2016-08-27 DIAGNOSIS — D649 Anemia, unspecified: Secondary | ICD-10-CM | POA: Diagnosis not present

## 2016-08-27 DIAGNOSIS — I129 Hypertensive chronic kidney disease with stage 1 through stage 4 chronic kidney disease, or unspecified chronic kidney disease: Secondary | ICD-10-CM | POA: Diagnosis not present

## 2016-08-27 DIAGNOSIS — Z79899 Other long term (current) drug therapy: Secondary | ICD-10-CM | POA: Diagnosis not present

## 2016-08-27 LAB — BASIC METABOLIC PANEL
BUN: 45 — AB (ref 4–21)
Creatinine: 1.7 — AB (ref <=1.1)
Glucose: 53
Potassium: 3.6 (ref 3.4–5.3)
SODIUM: 142 (ref 137–147)

## 2016-08-27 LAB — TSH: TSH: 11.55 — AB (ref <=5.90)

## 2016-08-28 ENCOUNTER — Other Ambulatory Visit: Payer: Self-pay | Admitting: *Deleted

## 2016-08-28 DIAGNOSIS — R52 Pain, unspecified: Secondary | ICD-10-CM | POA: Diagnosis not present

## 2016-08-29 ENCOUNTER — Non-Acute Institutional Stay (SKILLED_NURSING_FACILITY): Payer: Medicare Other | Admitting: Nurse Practitioner

## 2016-08-29 ENCOUNTER — Encounter: Payer: Self-pay | Admitting: Nurse Practitioner

## 2016-08-29 DIAGNOSIS — M1712 Unilateral primary osteoarthritis, left knee: Secondary | ICD-10-CM

## 2016-08-29 DIAGNOSIS — E039 Hypothyroidism, unspecified: Secondary | ICD-10-CM | POA: Diagnosis not present

## 2016-08-29 DIAGNOSIS — R300 Dysuria: Secondary | ICD-10-CM | POA: Insufficient documentation

## 2016-08-29 NOTE — Assessment & Plan Note (Signed)
08/27/16 TSH 11.55 08/28/16 increase Levothyroxine 34mcg po qd, TSH 8 weeks.

## 2016-08-29 NOTE — Assessment & Plan Note (Signed)
dysuria and urinary frequency x 1 day, she stated it gave her chills when she urinate, bathroom trips 5-6x/last night, denied blood in urine, suprapubic or back pain associated with onset of dysuria, she denied nausea, vomiting, or constipation(takes Senakot S with prune juice in am since 08/28/16), she is afebrile.  Cath UA C/S and CBC in am Pyridium 100mg  tid po x 2 days.

## 2016-08-29 NOTE — Progress Notes (Signed)
Location:  [facility] Room Number: 5 Place of Service:  SNF (31) Provider:  Mast, Manxie  NP  Blanchie Serve, MD  Patient Care Team: Blanchie Serve, MD as PCP - General (Internal Medicine) Laurence Spates, MD as Consulting Physician (Gastroenterology) Hennie Duos, MD as Consulting Physician (Rheumatology) Corliss Parish, MD as Consulting Physician (Nephrology) Jettie Booze, MD as Consulting Physician (Cardiology) Glenna Fellows, MD as Attending Physician (Neurosurgery) Susa Day, MD as Consulting Physician (Orthopedic Surgery) Mast, Man X, NP as Nurse Practitioner (Internal Medicine)  Extended Emergency Contact Information Primary Emergency Contact: [facility] Address: [address]          [place], [place] [address] [name] of [place] Phone: [phone] Mobile Phone: [phone] Relation: Daughter Secondary Emergency Contact: Donnald Garre Address: [place], [place] Phone: [phone] Mobile Phone: [phone] Relation: Son  Code Status: DNR Goals of care: Advanced Directive information Advanced Directives 08/29/2016  Does Patient Have a Medical Advance Directive? Yes  Type of Advance Directive Out of facility DNR (pink MOST or yellow form)  Does patient want to make changes to medical advance directive? No - Patient declined  Copy of [place] in Chart? -  Would patient like information on creating a medical advance directive? -  Pre-existing out of facility DNR order (yellow form or pink MOST form) Yellow form placed in chart (order not valid for inpatient use)     Chief Complaint  Patient presents with  . Acute Visit    Elevated TSH, dysuria    HPI:  Pt is a 83 y.o. female seen today for an acute visit for    Past Medical History:  Diagnosis Date  . Anxiety   . Aortic stenosis    a. Mild AS by echo 03/2013.  Marland Kitchen Arthritis   .  Bilateral knee pain 06/10/2012  . Cardiomegaly 12/23/2014  . Cataract   . Chronic kidney disease    Stage III/IV  . Colitis, collagenous 07/11/2011  . Corticosteroid dependence ([place]) 12/17/2014  . DDD (degenerative disc disease), thoracolumbar 12/23/2014  . Debility 12/23/2014  . Diverticulitis   . Diverticulosis of colon without hemorrhage 12/23/2014  . Dysphagia 12/23/2014  . Edema 12/23/2014  . Effusion of left knee 12/23/2014  . Enteritis 12/17/2014  . Hiatal hernia 12/23/2014  . HNP (herniated nucleus pulposus), lumbar 12/23/2014   L 4-5   . Hyperlipidemia   . Hypertension   . Hypokalemia   . Hypothyroidism   . IBS (irritable bowel syndrome) 12/23/2014  . Irritable bowel   . Noninfected skin tear of leg 12/23/2014   Left leg   . Osteoarthritis of left knee 12/23/2014  . Osteoporosis   . PMR (polymyalgia rheumatica) (HCC)    a. On chronic prednisone.  . Polymyalgia rheumatica ([place]) 07/11/2011  . Premature atrial contractions    a. Noted during adm 03/2013, corresponding with patient's palpitations.  . Scoliosis (and kyphoscoliosis), idiopathic 12/23/2014  . Spinal stenosis   . Spinal stenosis of lumbar region 12/23/2014  . Symptomatic bradycardia    a. Adm 03/2013 - taken off clonidine and BB.  . T11 vertebral fracture ([place]) 12/23/2014  . Unstable gait 12/23/2014   Past Surgical History:  Procedure Laterality Date  . ABDOMINAL HYSTERECTOMY  1966  . APPENDECTOMY    . BACK SURGERY    . Breast biopsies    . CHOLECYSTECTOMY    . EYE SURGERY  Allergies  Allergen Reactions  . Codeine Nausea And Vomiting  . Fosamax [Alendronate Sodium]     Unknown.   Dion Saucier [Calcitonin]     Unknown.  . Nsaids     Due to renal function.   . Quinolones Other (See Comments)    platelets  . Sulfa Antibiotics Nausea And Vomiting    Outpatient Encounter Prescriptions as of 08/29/2016  Medication Sig  . ALPRAZolam (XANAX) 0.25 MG tablet Take 0.25 mg by mouth 4 (four) times daily.  Marland Kitchen  amLODipine (NORVASC) 5 MG tablet Take 5 mg by mouth daily.  . Cholecalciferol (VITAMIN D) 2000 UNITS CAPS Take 2,000 Units by mouth daily.  Marland Kitchen docusate sodium (COLACE) 100 MG capsule Take 100 mg by mouth 2 (two) times daily as needed for mild constipation.  . hydrALAZINE (APRESOLINE) 50 MG tablet Take 1 tablet (50 mg total) by mouth 3 (three) times daily. (Every 8 hours)  . hydrocortisone cream 1 % Apply 1 application topically. Apply to inflamed anal area twice a day until healed  . magnesium hydroxide (MILK OF MAGNESIA) 400 MG/5ML suspension Take 30 mLs by mouth as needed for mild constipation.  Marland Kitchen omeprazole (PRILOSEC) 20 MG capsule Take 20 mg by mouth daily.  . potassium chloride SA (K-DUR,KLOR-CON) 20 MEQ tablet Take 1 tablet (20 mEq total) by mouth 2 (two) times daily.  . predniSONE (DELTASONE) 1 MG tablet Take 1 mg by mouth daily with breakfast.   . predniSONE (DELTASONE) 20 MG tablet Take 40 mg by mouth daily with breakfast.  . predniSONE (DELTASONE) 5 MG tablet Take 5 mg by mouth every morning.   . saccharomyces boulardii (FLORASTOR) 250 MG capsule Take 250 mg by mouth 2 (two) times daily. Stop date 08/25/16  . sertraline (ZOLOFT) 25 MG tablet Take 25 mg by mouth daily.  . sertraline (ZOLOFT) 50 MG tablet Take 50 mg by mouth at bedtime. Take with 25 mg to equal a total of 75 mg.  . [DISCONTINUED] furosemide (LASIX) 40 MG tablet Take 40 mg by mouth every morning.   . [DISCONTINUED] HYDROcodone-acetaminophen (NORCO) 10-325 MG tablet Take 1 tablet by mouth every 6 (six) hours.   . [DISCONTINUED] levothyroxine (SYNTHROID, LEVOTHROID) 50 MCG tablet Take 50 mcg by mouth daily.   . [DISCONTINUED] nitrofurantoin, macrocrystal-monohydrate, (MACROBID) 100 MG capsule Take 100 mg by mouth 2 (two) times daily. Stop date 08/22/16  . [DISCONTINUED] traMADol (ULTRAM) 50 MG tablet Take 50 mg by mouth every 4 (four) hours as needed for moderate pain or severe pain.   No facility-administered encounter  medications on file as of 08/29/2016.     Review of Systems  Immunization History  Administered Date(s) Administered  . Influenza-Unspecified 11/01/2013  . Pneumococcal Polysaccharide-23 03/22/2004  . Tdap 11/21/2011  . Zoster 06/22/2009   Pertinent  Health Maintenance Due  Topic Date Due  . INFLUENZA VACCINE  10/14/2016 (Originally 08/14/2016)  . PNA vac Low Risk Adult (2 of 2 - PCV13) 01/14/2017 (Originally 03/22/2005)  . DEXA SCAN  07/04/2017 (Originally 05/07/1984)   Fall Risk  01/17/2016  Falls in the past year? Yes  Number falls in past yr: 2 or more   Functional Status Survey:    Vitals:   08/29/16 1225  BP: 130/62  Pulse: 70  Resp: 17  Temp: (!) 97.2 F (36.2 C)  Weight: 138 lb 6.4 oz (62.8 kg)  Height: 5\' 4"  (1.626 m)   Body mass index is 23.76 kg/m. Physical Exam  Labs reviewed:  Recent Labs  01/17/16 1440  05/28/16 07/23/16 08/27/16  NA 141  < > 144 142 142  K 4.1  < > 3.5 3.6 3.6  CL 104  --   --   --   --   CO2 28  --   --   --   --   GLUCOSE 119*  --   --   --   --   BUN 36*  < > 39* 42* 45*  CREATININE 1.47*  < > 1.6* 1.5* 1.7*  CALCIUM 9.1  --   --   --   --   < > = values in this interval not displayed.  Recent Labs  02/13/16 05/28/16 07/23/16  AST 14 15 15   ALT 9 8 10   ALKPHOS 75 116 92    Recent Labs  01/17/16 1440 02/13/16 05/28/16 07/23/16  WBC 9.9 7.2 7.5 6.0  NEUTROABS 8.1*  --   --   --   HGB 11.9* 11.4* 11.5* 11.5*  HCT 36.8 35* 35* 34*  MCV 87.0  --   --   --   PLT 199 144* 172 146*   Lab Results  Component Value Date   TSH 11.55 (A) 08/27/2016   No results found for: HGBA1C No results found for: CHOL, HDL, LDLCALC, LDLDIRECT, TRIG, CHOLHDL  Significant Diagnostic Results in last 30 days:  No results found.  Assessment/Plan 1. Hypothyroidism, unspecified type   2. Dysuria   3. Primary osteoarthritis of left knee     Family/ staff Communication:   Labs/tests ordered:

## 2016-08-29 NOTE — Progress Notes (Signed)
Location:  [facility] Room Number: 31 Place of Service:  SNF (31) Provider:  Lailie Smead, Manxie  NP  Blanchie Serve, MD  Patient Care Team: Blanchie Serve, MD as PCP - General (Internal Medicine) Laurence Spates, MD as Consulting Physician (Gastroenterology) Hennie Duos, MD as Consulting Physician (Rheumatology) Corliss Parish, MD as Consulting Physician (Nephrology) Jettie Booze, MD as Consulting Physician (Cardiology) Glenna Fellows, MD as Attending Physician (Neurosurgery) Susa Day, MD as Consulting Physician (Orthopedic Surgery) Vela Render X, NP as Nurse Practitioner (Internal Medicine)  Extended Emergency Contact Information Primary Emergency Contact: [facility] Address: [address]          [place], [place] [address] [name] of [place] Phone: [phone] Mobile Phone: [phone] Relation: Daughter Secondary Emergency Contact: Donnald Garre Address: [place], [place] Phone: [phone] Mobile Phone: [phone] Relation: Son  Code Status:  DNR Goals of care: Advanced Directive information Advanced Directives 08/29/2016  Does Patient Have a Medical Advance Directive? Yes  Type of Advance Directive Out of facility DNR (pink MOST or yellow form)  Does patient want to make changes to medical advance directive? No - Patient declined  Copy of [place] in Chart? -  Would patient like information on creating a medical advance directive? -  Pre-existing out of facility DNR order (yellow form or pink MOST form) Yellow form placed in chart (order not valid for inpatient use)     Chief Complaint  Patient presents with  . Acute Visit    Elevated TSH, dysuria    HPI:  Pt is a 83 y.o. female seen today for an acute visit for dysuria and urinary frequency x 1 day, she stated it gave her chills when she urinate, bathroom trips 5-6x/last night, denied blood  in urine, suprapubic or back pain associated with onset of dysuria, she denied nausea, vomiting, or constipation(takes Senakot S with prune juice in am since 08/28/16), she is afebrile.    Hx of hypothyroidism, last TSH 11.55 08/27/16, subsequently her Levothyroxine was increased to 35mcg po daily.   Left knee pain, right knee pain,  she is under palliative care, she was placed on Norco 10/325mg  q4h po since 08/28/16, the patient stated she couldn't tell if her pain management is adequate or not.    Past Medical History:  Diagnosis Date  . Anxiety   . Aortic stenosis    a. Mild AS by echo 03/2013.  Marland Kitchen Arthritis   . Bilateral knee pain 06/10/2012  . Cardiomegaly 12/23/2014  . Cataract   . Chronic kidney disease    Stage III/IV  . Colitis, collagenous 07/11/2011  . Corticosteroid dependence ([place]) 12/17/2014  . DDD (degenerative disc disease), thoracolumbar 12/23/2014  . Debility 12/23/2014  . Diverticulitis   . Diverticulosis of colon without hemorrhage 12/23/2014  . Dysphagia 12/23/2014  . Edema 12/23/2014  . Effusion of left knee 12/23/2014  . Enteritis 12/17/2014  . Hiatal hernia 12/23/2014  . HNP (herniated nucleus pulposus), lumbar 12/23/2014   L 4-5   . Hyperlipidemia   . Hypertension   . Hypokalemia   . Hypothyroidism   . IBS (irritable bowel syndrome) 12/23/2014  . Irritable bowel   . Noninfected skin tear of leg 12/23/2014   Left leg   . Osteoarthritis of left knee 12/23/2014  . Osteoporosis   . PMR (polymyalgia rheumatica) (HCC)    a. On chronic prednisone.  . Polymyalgia  rheumatica ([place]) 07/11/2011  . Premature atrial contractions    a. Noted during adm 03/2013, corresponding with patient's palpitations.  . Scoliosis (and kyphoscoliosis), idiopathic 12/23/2014  . Spinal stenosis   . Spinal stenosis of lumbar region 12/23/2014  . Symptomatic bradycardia    a. Adm 03/2013 - taken off clonidine and BB.  . T11 vertebral fracture ([place]) 12/23/2014  . Unstable gait 12/23/2014   Past  Surgical History:  Procedure Laterality Date  . ABDOMINAL HYSTERECTOMY  1966  . APPENDECTOMY    . BACK SURGERY    . Breast biopsies    . CHOLECYSTECTOMY    . EYE SURGERY      Allergies  Allergen Reactions  . Codeine Nausea And Vomiting  . Fosamax [Alendronate Sodium]     Unknown.   Dion Saucier [Calcitonin]     Unknown.  . Nsaids     Due to renal function.   . Quinolones Other (See Comments)    platelets  . Sulfa Antibiotics Nausea And Vomiting    Outpatient Encounter Prescriptions as of 08/29/2016  Medication Sig  . ALPRAZolam (XANAX) 0.25 MG tablet Take 0.25 mg by mouth 4 (four) times daily.  Marland Kitchen amLODipine (NORVASC) 5 MG tablet Take 5 mg by mouth daily.  . Cholecalciferol (VITAMIN D) 2000 UNITS CAPS Take 2,000 Units by mouth daily.  Marland Kitchen docusate sodium (COLACE) 100 MG capsule Take 100 mg by mouth 2 (two) times daily as needed for mild constipation.  . furosemide (LASIX) 40 MG tablet Take 40 mg by mouth every morning.   . hydrALAZINE (APRESOLINE) 50 MG tablet Take 1 tablet (50 mg total) by mouth 3 (three) times daily. (Every 8 hours)  . HYDROcodone-acetaminophen (NORCO) 10-325 MG tablet Take 1 tablet by mouth every 6 (six) hours.   . hydrocortisone cream 1 % Apply 1 application topically. Apply to inflamed anal area twice a day until healed  . levothyroxine (SYNTHROID, LEVOTHROID) 50 MCG tablet Take 50 mcg by mouth daily.   . magnesium hydroxide (MILK OF MAGNESIA) 400 MG/5ML suspension Take 30 mLs by mouth as needed for mild constipation.  . nitrofurantoin, macrocrystal-monohydrate, (MACROBID) 100 MG capsule Take 100 mg by mouth 2 (two) times daily. Stop date 08/22/16  . omeprazole (PRILOSEC) 20 MG capsule Take 20 mg by mouth daily.  . potassium chloride SA (K-DUR,KLOR-CON) 20 MEQ tablet Take 1 tablet (20 mEq total) by mouth 2 (two) times daily.  . predniSONE (DELTASONE) 1 MG tablet Take 1 mg by mouth daily with breakfast.   . predniSONE (DELTASONE) 5 MG tablet Take 5 mg by mouth  every morning.   . saccharomyces boulardii (FLORASTOR) 250 MG capsule Take 250 mg by mouth 2 (two) times daily. Stop date 08/25/16  . sertraline (ZOLOFT) 50 MG tablet Take 50 mg by mouth at bedtime. Take with 25 mg to equal a total of 75 mg.  . traMADol (ULTRAM) 50 MG tablet Take 50 mg by mouth every 4 (four) hours as needed for moderate pain or severe pain.   No facility-administered encounter medications on file as of 08/29/2016.     Review of Systems  Constitutional: Positive for chills. Negative for activity change, appetite change, diaphoresis, fatigue and fever.  Eyes: Visual disturbance: corrective lenses.  Cardiovascular: Positive for leg swelling. Negative for chest pain and palpitations.       Chronic BLE edema  Gastrointestinal: Negative for abdominal distention, abdominal pain, constipation, diarrhea, nausea and vomiting.  Endocrine: Negative for cold intolerance, heat intolerance, polydipsia, polyphagia and polyuria.  Genitourinary: Positive for dysuria and frequency. Negative for difficulty urinating, flank pain, hematuria, pelvic pain and urgency.  Musculoskeletal: Positive for arthralgias. Joint swelling: left hand and wrist. Both knees. Left elbow with bruising and restriction of movement.       Lateral left knee pain, right knee pain  Skin: Wound: Lateral right lower leg open wound, no s/s of infection. Distal right forearm skin tear, proximate with steri strips, healing nicely.  Neurological: Numbness: left hand.  Psychiatric/Behavioral: Negative for agitation and behavioral problems.    Immunization History  Administered Date(s) Administered  . Influenza-Unspecified 11/01/2013  . Pneumococcal Polysaccharide-23 03/22/2004  . Tdap 11/21/2011  . Zoster 06/22/2009   Pertinent  Health Maintenance Due  Topic Date Due  . INFLUENZA VACCINE  10/14/2016 (Originally 08/14/2016)  . PNA vac Low Risk Adult (2 of 2 - PCV13) 01/14/2017 (Originally 03/22/2005)  . DEXA SCAN  07/04/2017  (Originally 05/07/1984)   Fall Risk  01/17/2016  Falls in the past year? Yes  Number falls in past yr: 2 or more   Functional Status Survey:    Vitals:   08/29/16 1225  BP: 130/62  Pulse: 70  Resp: 17  Temp: (!) 97.2 F (36.2 C)  Weight: 138 lb 6.4 oz (62.8 kg)  Height: 5\' 4"  (1.626 m)   Body mass index is 23.76 kg/m. Physical Exam  Constitutional: She is oriented to person, place, and time. She appears well-developed and well-nourished. No distress.  Overweight  Eyes: Pupils are equal, round, and reactive to light. Conjunctivae and EOM are normal.  Neck: Normal range of motion. Neck supple. No thyromegaly present.  Cardiovascular: Normal rate, regular rhythm and intact distal pulses.  Exam reveals no gallop and no friction rub.   Murmur heard. Systolic murmur 8-1/8 the right sternal border.   Pulmonary/Chest: Effort normal.  Abdominal: She exhibits no distension and no mass. There is no tenderness. There is no rebound and no guarding.  Genitourinary:  Genitourinary Comments: External hemorrhoids.   Musculoskeletal: Normal range of motion. She exhibits edema and tenderness.  Lateral left knee pain palpated  Neurological: She is alert and oriented to person, place, and time. No cranial nerve deficit. Coordination normal.  Skin: She is not diaphoretic.     Psychiatric: She has a normal mood and affect. Her behavior is normal. Judgment and thought content normal.    Labs reviewed:  Recent Labs  01/17/16 1440  05/28/16 07/23/16 08/27/16  NA 141  < > 144 142 142  K 4.1  < > 3.5 3.6 3.6  CL 104  --   --   --   --   CO2 28  --   --   --   --   GLUCOSE 119*  --   --   --   --   BUN 36*  < > 39* 42* 45*  CREATININE 1.47*  < > 1.6* 1.5* 1.7*  CALCIUM 9.1  --   --   --   --   < > = values in this interval not displayed.  Recent Labs  02/13/16 05/28/16 07/23/16  AST 14 15 15   ALT 9 8 10   ALKPHOS 75 116 92    Recent Labs  01/17/16 1440 02/13/16 05/28/16 07/23/16    WBC 9.9 7.2 7.5 6.0  NEUTROABS 8.1*  --   --   --   HGB 11.9* 11.4* 11.5* 11.5*  HCT 36.8 35* 35* 34*  MCV 87.0  --   --   --  PLT 199 144* 172 146*   Lab Results  Component Value Date   TSH 11.55 (A) 08/27/2016   No results found for: HGBA1C No results found for: CHOL, HDL, LDLCALC, LDLDIRECT, TRIG, CHOLHDL  Significant Diagnostic Results in last 30 days:  No results found.  Assessment/Plan Hypothyroidism 08/27/16 TSH 11.55 08/28/16 increase Levothyroxine 22mcg po qd, TSH 8 weeks.    Dysuria dysuria and urinary frequency x 1 day, she stated it gave her chills when she urinate, bathroom trips 5-6x/last night, denied blood in urine, suprapubic or back pain associated with onset of dysuria, she denied nausea, vomiting, or constipation(takes Senakot S with prune juice in am since 08/28/16), she is afebrile.  Cath UA C/S and CBC in am Pyridium 100mg  tid po x 2 days.   Osteoarthritis of left knee Per palliative, Norco 10/325mg  q4h po, continue to f/u palliative care for pain management.      Family/ staff Communication: plan of care reviewed with the patient and charge nurse.   Labs/tests ordered:  Cath UA C/S and CBC  Time spend 25 minutes

## 2016-08-29 NOTE — Assessment & Plan Note (Signed)
Per palliative, Norco 10/325mg  q4h po, continue to f/u palliative care for pain management.

## 2016-08-30 DIAGNOSIS — N390 Urinary tract infection, site not specified: Secondary | ICD-10-CM | POA: Diagnosis not present

## 2016-08-30 DIAGNOSIS — N183 Chronic kidney disease, stage 3 (moderate): Secondary | ICD-10-CM | POA: Diagnosis not present

## 2016-08-30 LAB — CBC AND DIFFERENTIAL
HCT: 37 (ref 36–46)
Hemoglobin: 12.3 (ref 12.0–16.0)
Platelets: 225 (ref 150–399)
WBC: 8.7

## 2016-09-02 DIAGNOSIS — R52 Pain, unspecified: Secondary | ICD-10-CM | POA: Diagnosis not present

## 2016-09-03 ENCOUNTER — Encounter: Payer: Self-pay | Admitting: Internal Medicine

## 2016-09-03 ENCOUNTER — Non-Acute Institutional Stay (SKILLED_NURSING_FACILITY): Payer: Medicare Other | Admitting: Internal Medicine

## 2016-09-03 ENCOUNTER — Non-Acute Institutional Stay (SKILLED_NURSING_FACILITY): Payer: Medicare Other

## 2016-09-03 DIAGNOSIS — E039 Hypothyroidism, unspecified: Secondary | ICD-10-CM | POA: Diagnosis not present

## 2016-09-03 DIAGNOSIS — N390 Urinary tract infection, site not specified: Secondary | ICD-10-CM | POA: Diagnosis not present

## 2016-09-03 DIAGNOSIS — R300 Dysuria: Secondary | ICD-10-CM | POA: Diagnosis not present

## 2016-09-03 DIAGNOSIS — E309 Disorder of puberty, unspecified: Secondary | ICD-10-CM | POA: Diagnosis not present

## 2016-09-03 DIAGNOSIS — N183 Chronic kidney disease, stage 3 (moderate): Secondary | ICD-10-CM | POA: Diagnosis not present

## 2016-09-03 DIAGNOSIS — Z79899 Other long term (current) drug therapy: Secondary | ICD-10-CM | POA: Diagnosis not present

## 2016-09-03 DIAGNOSIS — Z0000 Encounter for general adult medical examination without abnormal findings: Secondary | ICD-10-CM

## 2016-09-03 DIAGNOSIS — B9620 Unspecified Escherichia coli [E. coli] as the cause of diseases classified elsewhere: Secondary | ICD-10-CM | POA: Diagnosis not present

## 2016-09-03 LAB — BASIC METABOLIC PANEL
BUN: 52 — AB (ref 4–21)
Creatinine: 1.7 — AB (ref <=1.1)
Glucose: 82
Potassium: 3.8 (ref 3.4–5.3)
SODIUM: 141 (ref 137–147)

## 2016-09-03 LAB — TSH: TSH: 8.23 — AB (ref <=5.90)

## 2016-09-03 LAB — HEPATIC FUNCTION PANEL
ALK PHOS: 121 (ref 25–125)
ALT: 11 (ref 7–35)
AST: 14 (ref 13–35)
BILIRUBIN, TOTAL: 0.3

## 2016-09-03 NOTE — Progress Notes (Signed)
Location:  [facility] Room Number: 95 Place of Service:  SNF [phone]) Provider:  Nautika Cressey Molly Maduro, Jamyria Ozanich MD  Patient Care Team: Blanchie Serve, MD as PCP - General (Internal Medicine) Laurence Spates, MD as Consulting Physician (Gastroenterology) Hennie Duos, MD as Consulting Physician (Rheumatology) Corliss Parish, MD as Consulting Physician (Nephrology) Jettie Booze, MD as Consulting Physician (Cardiology) Glenna Fellows, MD as Attending Physician (Neurosurgery) Susa Day, MD as Consulting Physician (Orthopedic Surgery) Mast, Man X, NP as Nurse Practitioner (Internal Medicine)  Extended Emergency Contact Information Primary Emergency Contact: [facility] Address: [address]          [place], [place] [address] [name] of [place] Phone: [phone] Mobile Phone: [phone] Relation: Daughter Secondary Emergency Contact: Donnald Garre Address: [place], [place] of [place] Phone: [phone] Mobile Phone: [phone] Relation: Son  Code Status:  DNR Goals of care: Advanced Directive information Advanced Directives 09/03/2016  Does Patient Have a Medical Advance Directive? Yes  Type of Advance Directive Out of facility DNR (pink MOST or yellow form)  Does patient want to make changes to medical advance directive? No - Patient declined  Copy of [place] in Chart? -  Would patient like information on creating a medical advance directive? -  Pre-existing out of facility DNR order (yellow form or pink MOST form) Yellow form placed in chart (order not valid for inpatient use);Pink MOST form placed in chart (order not valid for inpatient use)     Chief Complaint  Patient presents with  . Acute Visit    UTI    HPI:  Pt is a 83 y.o. female seen today for acute cystitis. She has been having dysuria with increased urinary frequency x few days. U/a with c/s was  sent and it has resulted positive for e.coli. Reviewed culture and sensitivity report. She was started by on call provider on ciprofloxacin with florastor. She is tolerating this fine. She continues to have some dysuria. Urinary frequency has decreased. She is also taking pyridium. appetite is fair. Denies any fever or chills.    Past Medical History:  Diagnosis Date  . Anxiety   . Aortic stenosis    a. Mild AS by echo 03/2013.  Marland Kitchen Arthritis   . Bilateral knee pain 06/10/2012  . Cardiomegaly 12/23/2014  . Cataract   . Chronic kidney disease    Stage III/IV  . Colitis, collagenous 07/11/2011  . Corticosteroid dependence ([place]) 12/17/2014  . DDD (degenerative disc disease), thoracolumbar 12/23/2014  . Debility 12/23/2014  . Diverticulitis   . Diverticulosis of colon without hemorrhage 12/23/2014  . Dysphagia 12/23/2014  . Edema 12/23/2014  . Effusion of left knee 12/23/2014  . Enteritis 12/17/2014  . Hiatal hernia 12/23/2014  . HNP (herniated nucleus pulposus), lumbar 12/23/2014   L 4-5   . Hyperlipidemia   . Hypertension   . Hypokalemia   . Hypothyroidism   . IBS (irritable bowel syndrome) 12/23/2014  . Irritable bowel   . Noninfected skin tear of leg 12/23/2014   Left leg   . Osteoarthritis of left knee 12/23/2014  . Osteoporosis   . PMR (polymyalgia rheumatica) (HCC)    a. On chronic prednisone.  . Polymyalgia rheumatica ([place]) 07/11/2011  . Premature atrial contractions    a. Noted during adm 03/2013, corresponding with patient's palpitations.  . Scoliosis (and kyphoscoliosis), idiopathic 12/23/2014  . Spinal stenosis   .  Spinal stenosis of lumbar region 12/23/2014  . Symptomatic bradycardia    a. Adm 03/2013 - taken off clonidine and BB.  . T11 vertebral fracture ([place]) 12/23/2014  . Unstable gait 12/23/2014   Past Surgical History:  Procedure Laterality Date  . ABDOMINAL HYSTERECTOMY  1966  . APPENDECTOMY    . BACK SURGERY    . Breast biopsies    . CHOLECYSTECTOMY    . EYE SURGERY        Allergies  Allergen Reactions  . Codeine Nausea And Vomiting  . Fosamax [Alendronate Sodium]     Unknown.   Dion Saucier [Calcitonin]     Unknown.  . Nsaids     Due to renal function.   . Quinolones Other (See Comments)    platelets  . Sulfa Antibiotics Nausea And Vomiting    Outpatient Encounter Prescriptions as of 09/03/2016  Medication Sig  . ALPRAZolam (XANAX) 0.25 MG tablet Take 0.25 mg by mouth 4 (four) times daily.  Marland Kitchen amLODipine (NORVASC) 5 MG tablet Take 5 mg by mouth daily.  . Cholecalciferol (VITAMIN D) 2000 UNITS CAPS Take 2,000 Units by mouth daily.  . ciprofloxacin (CIPRO) 250 MG tablet Take 250 mg by mouth 2 (two) times daily. Stop date 09/08/16  . docusate sodium (COLACE) 100 MG capsule Take 100 mg by mouth 2 (two) times daily as needed for mild constipation.  . furosemide (LASIX) 40 MG tablet Take 40 mg by mouth daily.  . hydrALAZINE (APRESOLINE) 50 MG tablet Take 1 tablet (50 mg total) by mouth 3 (three) times daily. (Every 8 hours)  . HYDROcodone-acetaminophen (NORCO) 10-325 MG tablet Take 1 tablet by mouth every 4 (four) hours.  . hydrocortisone cream 1 % Apply 1 application topically. Apply to inflamed anal area twice a day until healed  . levothyroxine (SYNTHROID, LEVOTHROID) 75 MCG tablet Take 75 mcg by mouth daily before breakfast.  . magnesium hydroxide (MILK OF MAGNESIA) 400 MG/5ML suspension Take 30 mLs by mouth as needed for mild constipation.  Marland Kitchen omeprazole (PRILOSEC) 20 MG capsule Take 20 mg by mouth daily.  . potassium chloride SA (K-DUR,KLOR-CON) 20 MEQ tablet Take 1 tablet (20 mEq total) by mouth 2 (two) times daily.  . predniSONE (DELTASONE) 1 MG tablet Take 1 mg by mouth daily with breakfast.   . predniSONE (DELTASONE) 5 MG tablet Take 5 mg by mouth every morning.   . saccharomyces boulardii (FLORASTOR) 250 MG capsule Take 250 mg by mouth 2 (two) times daily. Stop date 09/10/16  . sennosides-docusate sodium (SENOKOT-S) 8.6-50 MG tablet Take 1 tablet  by mouth 2 (two) times daily between meals.   . sertraline (ZOLOFT) 25 MG tablet Take 25 mg by mouth at bedtime.   . sertraline (ZOLOFT) 50 MG tablet Take 50 mg by mouth at bedtime. Take with 25 mg to equal a total of 75 mg.   No facility-administered encounter medications on file as of 09/03/2016.     Review of Systems  Constitutional: Negative for appetite change, chills, diaphoresis, fatigue and fever.       She feeds herself. She is compliant with her medications  HENT: Positive for hearing loss.   Eyes: Negative for visual disturbance.       Has corrective glasses  Gastrointestinal: Negative for abdominal pain, diarrhea, nausea and vomiting.  Genitourinary: Positive for dysuria and frequency. Negative for flank pain, hematuria and pelvic pain.       She is continent with her bowel and bladder  Musculoskeletal: Positive for arthralgias,  back pain and gait problem.  Skin: Negative for rash.  Neurological: Negative for dizziness.       Has numbness and tingling intermittently to left forearm and hand  Hematological: Bruises/bleeds easily.  Psychiatric/Behavioral: Negative for behavioral problems. The patient is not nervous/anxious.     Immunization History  Administered Date(s) Administered  . Influenza-Unspecified 11/01/2013  . Pneumococcal Polysaccharide-23 03/22/2004  . Tdap 11/21/2011  . Zoster 06/22/2009   Pertinent  Health Maintenance Due  Topic Date Due  . INFLUENZA VACCINE  10/14/2016 (Originally 08/14/2016)  . DEXA SCAN  Completed  . PNA vac Low Risk Adult  Completed   Fall Risk  09/03/2016 01/17/2016  Falls in the past year? Yes Yes  Number falls in past yr: 1 2 or more  Injury with Fall? Yes -   Functional Status Survey:    Vitals:   09/03/16 1441  BP: 122/62  Pulse: 68  Resp: 18  Temp: 97.6 F (36.4 C)  TempSrc: Oral  SpO2: 97%  Weight: 138 lb (62.6 kg)  Height: 5\' 4"  (1.626 m)   Body mass index is 23.69 kg/m.   Wt Readings from Last 3 Encounters:    09/03/16 138 lb (62.6 kg)  09/03/16 138 lb (62.6 kg)  08/29/16 138 lb 6.4 oz (62.8 kg)   Physical Exam  Constitutional: She is oriented to person, place, and time. She appears well-developed and well-nourished. No distress.  HENT:  Head: Normocephalic and atraumatic.  Neck: Neck supple.  Cardiovascular: Normal rate and regular rhythm.   Murmur heard. Systolic murmur  Pulmonary/Chest: Effort normal and breath sounds normal. She exhibits no tenderness.  Abdominal: Soft. Bowel sounds are normal. She exhibits no distension. There is no tenderness. There is no guarding.  Musculoskeletal: She exhibits edema.  Limited range of motion to both shoulder right > left, severe arthritis changes to her fingers, ROM left wrist limited, chronic back pain, 1+ pitting leg edema  Lymphadenopathy:    She has no cervical adenopathy.  Neurological: She is alert and oriented to person, place, and time.  Skin: Skin is warm and dry. She is not diaphoretic.  Easy bruising, senile purpura present, chronic skin changes to lower legs, skin tear to LLE with steri-strip  Psychiatric: She has a normal mood and affect.    Labs reviewed:  Recent Labs  01/17/16 1440  05/28/16 07/23/16 08/27/16  NA 141  < > 144 142 142  K 4.1  < > 3.5 3.6 3.6  CL 104  --   --   --   --   CO2 28  --   --   --   --   GLUCOSE 119*  --   --   --   --   BUN 36*  < > 39* 42* 45*  CREATININE 1.47*  < > 1.6* 1.5* 1.7*  CALCIUM 9.1  --   --   --   --   < > = values in this interval not displayed.  Recent Labs  02/13/16 05/28/16 07/23/16  AST 14 15 15   ALT 9 8 10   ALKPHOS 75 116 92    Recent Labs  01/17/16 1440  05/28/16 07/23/16 08/30/16  WBC 9.9  < > 7.5 6.0 8.7  NEUTROABS 8.1*  --   --   --   --   HGB 11.9*  < > 11.5* 11.5* 12.3  HCT 36.8  < > 35* 34* 37  MCV 87.0  --   --   --   --  PLT 199  < > 172 146* 225  < > = values in this interval not displayed. Lab Results  Component Value Date   TSH 11.55 (A) 08/27/2016    No results found for: HGBA1C No results found for: CHOL, HDL, LDLCALC, LDLDIRECT, TRIG, CHOLHDL  Significant Diagnostic Results in last 30 days:  No results found.  Assessment/Plan  E.coli uti With dysuria and increased urinary frequency. E.coli in urine culture. ciprofloxacin 250 mg q12h x 1 week with florastor 250 mg bid. Maintain hydration.  Dysuria Pyridium has been helpful, maintain hydration    Family/ staff Communication: reviewed care plan with patient and charge nurse.    Labs/tests ordered:  none    Blanchie Serve, MD Internal Medicine [facility] [address] [place], [place] [address] Cell Phone (Monday-Friday 8 am - 5 pm): [phone] On Call: [phone] and follow prompts after 5 pm and on weekends Office Phone: [phone] Office Fax: [phone]

## 2016-09-03 NOTE — Progress Notes (Signed)
Subjective:   Christina Cabrera is a 83 y.o. female who presents for Medicare Annual (Subsequent) preventive examination at [facility]  Last AWV-01/20/15    Objective:     Vitals: BP 130/68 (BP Location: Right Arm, Patient Position: Sitting)   Pulse (!) 58   Temp (!) 97.1 F (36.2 C) (Oral)   Ht 5\' 4"  (1.626 m)   Wt 138 lb (62.6 kg)   SpO2 96%   BMI 23.69 kg/m   Body mass index is 23.69 kg/m.   Tobacco History  Smoking Status  . Never Smoker  Smokeless Tobacco  . Never Used     Counseling given: Not Answered   Past Medical History:  Diagnosis Date  . Anxiety   . Aortic stenosis    a. Mild AS by echo 03/2013.  Marland Kitchen Arthritis   . Bilateral knee pain 06/10/2012  . Cardiomegaly 12/23/2014  . Cataract   . Chronic kidney disease    Stage III/IV  . Colitis, collagenous 07/11/2011  . Corticosteroid dependence ([place]) 12/17/2014  . DDD (degenerative disc disease), thoracolumbar 12/23/2014  . Debility 12/23/2014  . Diverticulitis   . Diverticulosis of colon without hemorrhage 12/23/2014  . Dysphagia 12/23/2014  . Edema 12/23/2014  . Effusion of left knee 12/23/2014  . Enteritis 12/17/2014  . Hiatal hernia 12/23/2014  . HNP (herniated nucleus pulposus), lumbar 12/23/2014   L 4-5   . Hyperlipidemia   . Hypertension   . Hypokalemia   . Hypothyroidism   . IBS (irritable bowel syndrome) 12/23/2014  . Irritable bowel   . Noninfected skin tear of leg 12/23/2014   Left leg   . Osteoarthritis of left knee 12/23/2014  . Osteoporosis   . PMR (polymyalgia rheumatica) (HCC)    a. On chronic prednisone.  . Polymyalgia rheumatica ([place]) 07/11/2011  . Premature atrial contractions    a. Noted during adm 03/2013, corresponding with patient's palpitations.  . Scoliosis (and kyphoscoliosis), idiopathic 12/23/2014  . Spinal stenosis   . Spinal stenosis of lumbar region 12/23/2014  . Symptomatic bradycardia    a. Adm 03/2013 - taken off clonidine and BB.  . T11 vertebral fracture ([place])  12/23/2014  . Unstable gait 12/23/2014   Past Surgical History:  Procedure Laterality Date  . ABDOMINAL HYSTERECTOMY  1966  . APPENDECTOMY    . BACK SURGERY    . Breast biopsies    . CHOLECYSTECTOMY    . EYE SURGERY     Family History  Problem Relation Age of Onset  . Hyperlipidemia Mother   . Hypertension Mother   . Hypertension Father   . Hyperlipidemia Father   . Hyperlipidemia Sister   . Hypertension Sister   . Diabetes Neg Hx   . Heart attack Neg Hx   . Sudden death Neg Hx    History  Sexual Activity  . Sexual activity: No    Outpatient Encounter Prescriptions as of 09/03/2016  Medication Sig  . ALPRAZolam (XANAX) 0.25 MG tablet Take 0.25 mg by mouth 4 (four) times daily.  Marland Kitchen amLODipine (NORVASC) 5 MG tablet Take 5 mg by mouth daily.  . Cholecalciferol (VITAMIN D) 2000 UNITS CAPS Take 2,000 Units by mouth daily.  Marland Kitchen docusate sodium (COLACE) 100 MG capsule Take 100 mg by mouth 2 (two) times daily as needed for mild constipation.  . hydrALAZINE (APRESOLINE) 50 MG tablet Take 1 tablet (50 mg total) by mouth 3 (three) times daily. (Every 8 hours)  . HYDROcodone-acetaminophen (NORCO) 10-325 MG tablet Take 1  tablet by mouth every 4 (four) hours.  . hydrocortisone cream 1 % Apply 1 application topically. Apply to inflamed anal area twice a day until healed  . levothyroxine (SYNTHROID, LEVOTHROID) 75 MCG tablet Take 75 mcg by mouth daily before breakfast.  . magnesium hydroxide (MILK OF MAGNESIA) 400 MG/5ML suspension Take 30 mLs by mouth as needed for mild constipation.  Marland Kitchen omeprazole (PRILOSEC) 20 MG capsule Take 20 mg by mouth daily.  . potassium chloride SA (K-DUR,KLOR-CON) 20 MEQ tablet Take 1 tablet (20 mEq total) by mouth 2 (two) times daily.  . predniSONE (DELTASONE) 1 MG tablet Take 1 mg by mouth daily with breakfast.   . predniSONE (DELTASONE) 5 MG tablet Take 5 mg by mouth every morning.   . sennosides-docusate sodium (SENOKOT-S) 8.6-50 MG tablet Take 1 tablet by mouth  daily.  . sertraline (ZOLOFT) 25 MG tablet Take 25 mg by mouth daily.  . sertraline (ZOLOFT) 50 MG tablet Take 50 mg by mouth at bedtime. Take with 25 mg to equal a total of 75 mg.  . [DISCONTINUED] predniSONE (DELTASONE) 20 MG tablet Take 40 mg by mouth daily with breakfast.  . [DISCONTINUED] saccharomyces boulardii (FLORASTOR) 250 MG capsule Take 250 mg by mouth 2 (two) times daily. Stop date 08/25/16   No facility-administered encounter medications on file as of 09/03/2016.     Activities of Daily Living In your present state of health, do you have any difficulty performing the following activities: 09/03/2016  Hearing? Y  Vision? Y  Difficulty concentrating or making decisions? Y  Walking or climbing stairs? Y  Dressing or bathing? Y  Doing errands, shopping? Y  Preparing Food and eating ? Y  Using the Toilet? Y  In the past six months, have you accidently leaked urine? N  Do you have problems with loss of bowel control? N  Managing your Medications? Y  Managing your Finances? Y  Housekeeping or managing your Housekeeping? Y  Some recent data might be hidden    Patient Care Team: Blanchie Serve, MD as PCP - General (Internal Medicine) Laurence Spates, MD as Consulting Physician (Gastroenterology) Hennie Duos, MD as Consulting Physician (Rheumatology) Corliss Parish, MD as Consulting Physician (Nephrology) Jettie Booze, MD as Consulting Physician (Cardiology) Glenna Fellows, MD as Attending Physician (Neurosurgery) Susa Day, MD as Consulting Physician (Orthopedic Surgery) Mast, Man X, NP as Nurse Practitioner (Internal Medicine)    Assessment:     Exercise Activities and Dietary recommendations Current Exercise Habits: The patient does not participate in regular exercise at present, Exercise limited by: orthopedic condition(s)  Goals    None     Fall Risk Fall Risk  09/03/2016 01/17/2016  Falls in the past year? Yes Yes  Number falls in past yr: 1 2  or more  Injury with Fall? Yes -   Depression Screen PHQ 2/9 Scores 09/03/2016 01/17/2016  PHQ - 2 Score 4 0  PHQ- 9 Score 7 -     Cognitive Function MMSE - Mini Mental State Exam 09/03/2016  Orientation to time 3  Orientation to Place 5  Registration 3  Attention/ Calculation 4  Recall 1  Language- name 2 objects 2  Language- repeat 1  Language- follow 3 step command 3  Language- read & follow direction 1  Write a sentence 1  Copy design 0  Total score 24        Immunization History  Administered Date(s) Administered  . Influenza-Unspecified 11/01/2013  . Pneumococcal Polysaccharide-23 03/22/2004  . Tdap  11/21/2011  . Zoster 06/22/2009   Screening Tests Health Maintenance  Topic Date Due  . INFLUENZA VACCINE  10/14/2016 (Originally 08/14/2016)  . PNA vac Low Risk Adult (2 of 2 - PCV13) 01/14/2017 (Originally 03/22/2005)  . DEXA SCAN  07/04/2017 (Originally 05/07/1984)  . TETANUS/TDAP  11/20/2021      Plan:    I have personally reviewed and addressed the Medicare Annual Wellness questionnaire and have noted the following in the patient's chart:  A. Medical and social history B. Use of alcohol, tobacco or illicit drugs  C. Current medications and supplements D. Functional ability and status E.  Nutritional status F.  Physical activity G. Advance directives H. List of other physicians I.  Hospitalizations, surgeries, and ER visits in previous 12 months J.  [place] to include hearing, vision, cognitive, depression L. Referrals and appointments - none  In addition, I have reviewed and discussed with patient certain preventive protocols, quality metrics, and best practice recommendations. A written personalized care plan for preventive services as well as general preventive health recommendations were provided to patient.  See attached scanned questionnaire for additional information.   Signed,   Rich Reining, RN Nurse Health Advisor   Quick  Notes   Health Maintenance: Up to date     Abnormal Screen: PHQ-9-7, MMSE 24/30. Did not pass clock drawing     Patient Concerns: None     Nurse Concerns: None

## 2016-09-03 NOTE — Patient Instructions (Signed)
Christina Cabrera , Thank you for taking time to come for your Medicare Wellness Visit. I appreciate your ongoing commitment to your health goals. Please review the following plan we discussed and let me know if I can assist you in the future.   Screening recommendations/referrals: Colonoscopy excluded, pt over age 81 Mammogram excluded, pt over age 29 Bone Density up to date Recommended yearly ophthalmology/optometry visit for glaucoma screening and checkup Recommended yearly dental visit for hygiene and checkup  Vaccinations: Influenza vaccine due 2018 fall season Pneumococcal vaccine up to date Tdap vaccine up to date. Due 11/20/21 Shingles vaccine up to date  Advanced directives: DNR in chart, copies of health care power of attorney and living will are needed   Conditions/risks identified: None  Next appointment: Dr. Bubba Camp makes rounds   Preventive Care 36 Years and Older, Female Preventive care refers to lifestyle choices and visits with your health care provider that can promote health and wellness. What does preventive care include?  A yearly physical exam. This is also called an annual well check.  Dental exams once or twice a year.  Routine eye exams. Ask your health care provider how often you should have your eyes checked.  Personal lifestyle choices, including:  Daily care of your teeth and gums.  Regular physical activity.  Eating a healthy diet.  Avoiding tobacco and drug use.  Limiting alcohol use.  Practicing safe sex.  Taking low-dose aspirin every day.  Taking vitamin and mineral supplements as recommended by your health care provider. What happens during an annual well check? The services and screenings done by your health care provider during your annual well check will depend on your age, overall health, lifestyle risk factors, and family history of disease. Counseling  Your health care provider may ask you questions about your:  Alcohol  use.  Tobacco use.  Drug use.  Emotional well-being.  Home and relationship well-being.  Sexual activity.  Eating habits.  History of falls.  Memory and ability to understand (cognition).  Work and work Statistician.  Reproductive health. Screening  You may have the following tests or measurements:  Height, weight, and BMI.  Blood pressure.  Lipid and cholesterol levels. These may be checked every 5 years, or more frequently if you are over 85 years old.  Skin check.  Lung cancer screening. You may have this screening every year starting at age 70 if you have a 30-pack-year history of smoking and currently smoke or have quit within the past 15 years.  Fecal occult blood test (FOBT) of the stool. You may have this test every year starting at age 10.  Flexible sigmoidoscopy or colonoscopy. You may have a sigmoidoscopy every 5 years or a colonoscopy every 10 years starting at age 12.  Hepatitis C blood test.  Hepatitis B blood test.  Sexually transmitted disease (STD) testing.  Diabetes screening. This is done by checking your blood sugar (glucose) after you have not eaten for a while (fasting). You may have this done every 1-3 years.  Bone density scan. This is done to screen for osteoporosis. You may have this done starting at age 81.  Mammogram. This may be done every 1-2 years. Talk to your health care provider about how often you should have regular mammograms. Talk with your health care provider about your test results, treatment options, and if necessary, the need for more tests. Vaccines  Your health care provider may recommend certain vaccines, such as:  Influenza vaccine. This is recommended  every year.  Tetanus, diphtheria, and acellular pertussis (Tdap, Td) vaccine. You may need a Td booster every 10 years.  Zoster vaccine. You may need this after age 21.  Pneumococcal 13-valent conjugate (PCV13) vaccine. One dose is recommended after age  23.  Pneumococcal polysaccharide (PPSV23) vaccine. One dose is recommended after age 30. Talk to your health care provider about which screenings and vaccines you need and how often you need them. This information is not intended to replace advice given to you by your health care provider. Make sure you discuss any questions you have with your health care provider. Document Released: 01/27/2015 Document Revised: 09/20/2015 Document Reviewed: 11/01/2014 Elsevier Interactive Patient Education  2017 [place] Prevention in the Home Falls can cause injuries. They can happen to people of all ages. There are many things you can do to make your home safe and to help prevent falls. What can I do on the outside of my home?  Regularly fix the edges of walkways and driveways and fix any cracks.  Remove anything that might make you trip as you walk through a door, such as a raised step or threshold.  Trim any bushes or trees on the path to your home.  Use bright outdoor lighting.  Clear any walking paths of anything that might make someone trip, such as rocks or tools.  Regularly check to see if handrails are loose or broken. Make sure that both sides of any steps have handrails.  Any raised decks and porches should have guardrails on the edges.  Have any leaves, snow, or ice cleared regularly.  Use sand or salt on walking paths during winter.  Clean up any spills in your garage right away. This includes oil or grease spills. What can I do in the bathroom?  Use night lights.  Install grab bars by the toilet and in the tub and shower. Do not use towel bars as grab bars.  Use non-skid mats or decals in the tub or shower.  If you need to sit down in the shower, use a plastic, non-slip stool.  Keep the floor dry. Clean up any water that spills on the floor as soon as it happens.  Remove soap buildup in the tub or shower regularly.  Attach bath mats securely with double-sided  non-slip rug tape.  Do not have throw rugs and other things on the floor that can make you trip. What can I do in the bedroom?  Use night lights.  Make sure that you have a light by your bed that is easy to reach.  Do not use any sheets or blankets that are too big for your bed. They should not hang down onto the floor.  Have a firm chair that has side arms. You can use this for support while you get dressed.  Do not have throw rugs and other things on the floor that can make you trip. What can I do in the kitchen?  Clean up any spills right away.  Avoid walking on wet floors.  Keep items that you use a lot in easy-to-reach places.  If you need to reach something above you, use a strong step stool that has a grab bar.  Keep electrical cords out of the way.  Do not use floor polish or wax that makes floors slippery. If you must use wax, use non-skid floor wax.  Do not have throw rugs and other things on the floor that can make you trip. What  can I do with my stairs?  Do not leave any items on the stairs.  Make sure that there are handrails on both sides of the stairs and use them. Fix handrails that are broken or loose. Make sure that handrails are as long as the stairways.  Check any carpeting to make sure that it is firmly attached to the stairs. Fix any carpet that is loose or worn.  Avoid having throw rugs at the top or bottom of the stairs. If you do have throw rugs, attach them to the floor with carpet tape.  Make sure that you have a light switch at the top of the stairs and the bottom of the stairs. If you do not have them, ask someone to add them for you. What else can I do to help prevent falls?  Wear shoes that:  Do not have high heels.  Have rubber bottoms.  Are comfortable and fit you well.  Are closed at the toe. Do not wear sandals.  If you use a stepladder:  Make sure that it is fully opened. Do not climb a closed stepladder.  Make sure that both  sides of the stepladder are locked into place.  Ask someone to hold it for you, if possible.  Clearly mark and make sure that you can see:  Any grab bars or handrails.  First and last steps.  Where the edge of each step is.  Use tools that help you move around (mobility aids) if they are needed. These include:  Canes.  Walkers.  Scooters.  Crutches.  Turn on the lights when you go into a dark area. Replace any light bulbs as soon as they burn out.  Set up your furniture so you have a clear path. Avoid moving your furniture around.  If any of your floors are uneven, fix them.  If there are any pets around you, be aware of where they are.  Review your medicines with your doctor. Some medicines can make you feel dizzy. This can increase your chance of falling. Ask your doctor what other things that you can do to help prevent falls. This information is not intended to replace advice given to you by your health care provider. Make sure you discuss any questions you have with your health care provider. Document Released: 10/27/2008 Document Revised: 06/08/2015 Document Reviewed: 02/04/2014 Elsevier Interactive Patient Education  2017 [employer].

## 2016-09-04 ENCOUNTER — Other Ambulatory Visit: Payer: Self-pay | Admitting: *Deleted

## 2016-09-11 ENCOUNTER — Non-Acute Institutional Stay (SKILLED_NURSING_FACILITY): Payer: Medicare Other | Admitting: Nurse Practitioner

## 2016-09-11 ENCOUNTER — Encounter: Payer: Self-pay | Admitting: Nurse Practitioner

## 2016-09-11 DIAGNOSIS — S81802D Unspecified open wound, left lower leg, subsequent encounter: Secondary | ICD-10-CM

## 2016-09-11 DIAGNOSIS — R609 Edema, unspecified: Secondary | ICD-10-CM | POA: Diagnosis not present

## 2016-09-11 DIAGNOSIS — M1712 Unilateral primary osteoarthritis, left knee: Secondary | ICD-10-CM | POA: Diagnosis not present

## 2016-09-11 DIAGNOSIS — N183 Chronic kidney disease, stage 3 unspecified: Secondary | ICD-10-CM

## 2016-09-11 NOTE — Assessment & Plan Note (Signed)
08/27/16 BNP 119 09/11/16 no developing s/s of CHF. Noted 2+ edema BLE, mainly in lower legs, ankles, feet, increase Furosemide 40mg  bid/qd for better control of swelling to promote skin tear healing.

## 2016-09-11 NOTE — Assessment & Plan Note (Signed)
08/27/16 creat 1.7 09/11/16 increase Furosemide 40mg  bid/daily to decrease swelling to promote skin tear healing, Rvs B explained to the patient and POA, POA is in agreement of plan of care, update BMP in one week.

## 2016-09-11 NOTE — Progress Notes (Signed)
Location:  [facility] Room Number: 64 Place of Service:  SNF (31) Provider:  Mast, Manxie  NP  Blanchie Serve, MD  Patient Care Team: Blanchie Serve, MD as PCP - General (Internal Medicine) Laurence Spates, MD as Consulting Physician (Gastroenterology) Hennie Duos, MD as Consulting Physician (Rheumatology) Corliss Parish, MD as Consulting Physician (Nephrology) Jettie Booze, MD as Consulting Physician (Cardiology) Glenna Fellows, MD as Attending Physician (Neurosurgery) Susa Day, MD as Consulting Physician (Orthopedic Surgery) Mast, Man X, NP as Nurse Practitioner (Internal Medicine)  Extended Emergency Contact Information Primary Emergency Contact: [facility] Address: [address]          [place],  [address] [name] of [place] Phone: [phone] Mobile Phone: [phone] Relation: Daughter Secondary Emergency Contact: Donnald Garre Address: [place], [place] Phone: [phone] Mobile Phone: [phone] Relation: Son  Code Status:  DNR Goals of care: Advanced Directive information Advanced Directives 09/11/2016  Does Patient Have a Medical Advance Directive? Yes  Type of Advance Directive Out of facility DNR (pink MOST or yellow form)  Does patient want to make changes to medical advance directive? No - Patient declined  Copy of [place] in Chart? -  Would patient like information on creating a medical advance directive? -  Pre-existing out of facility DNR order (yellow form or pink MOST form) Yellow form placed in chart (order not valid for inpatient use)     Chief Complaint  Patient presents with  . Acute Visit    skin tear lower leg    HPI:  Pt is a 83 y.o. female seen today for an acute visit for    Past Medical History:  Diagnosis Date  . Anxiety   . Aortic stenosis    a. Mild AS by echo 03/2013.  Marland Kitchen Arthritis   .  Bilateral knee pain 06/10/2012  . Cardiomegaly 12/23/2014  . Cataract   . Chronic kidney disease    Stage III/IV  . Colitis, collagenous 07/11/2011  . Corticosteroid dependence ([place]) 12/17/2014  . DDD (degenerative disc disease), thoracolumbar 12/23/2014  . Debility 12/23/2014  . Diverticulitis   . Diverticulosis of colon without hemorrhage 12/23/2014  . Dysphagia 12/23/2014  . Edema 12/23/2014  . Effusion of left knee 12/23/2014  . Enteritis 12/17/2014  . Hiatal hernia 12/23/2014  . HNP (herniated nucleus pulposus), lumbar 12/23/2014   L 4-5   . Hyperlipidemia   . Hypertension   . Hypokalemia   . Hypothyroidism   . IBS (irritable bowel syndrome) 12/23/2014  . Irritable bowel   . Noninfected skin tear of leg 12/23/2014   Left leg   . Osteoarthritis of left knee 12/23/2014  . Osteoporosis   . PMR (polymyalgia rheumatica) (HCC)    a. On chronic prednisone.  . Polymyalgia rheumatica ([place]) 07/11/2011  . Premature atrial contractions    a. Noted during adm 03/2013, corresponding with patient's palpitations.  . Scoliosis (and kyphoscoliosis), idiopathic 12/23/2014  . Spinal stenosis   . Spinal stenosis of lumbar region 12/23/2014  . Symptomatic bradycardia    a. Adm 03/2013 - taken off clonidine and BB.  . T11 vertebral fracture ([place]) 12/23/2014  . Unstable gait 12/23/2014   Past Surgical History:  Procedure Laterality Date  . ABDOMINAL HYSTERECTOMY  1966  . APPENDECTOMY    . BACK SURGERY    . Breast biopsies    . CHOLECYSTECTOMY    .  EYE SURGERY      Allergies  Allergen Reactions  . Codeine Nausea And Vomiting  . Fosamax [Alendronate Sodium]     Unknown.   Dion Saucier [Calcitonin]     Unknown.  . Nsaids     Due to renal function.   . Quinolones Other (See Comments)    platelets  . Sulfa Antibiotics Nausea And Vomiting    Outpatient Encounter Prescriptions as of 09/11/2016  Medication Sig  . ALPRAZolam (XANAX) 0.25 MG tablet Take 0.25 mg by mouth 4 (four) times daily.  Marland Kitchen  amLODipine (NORVASC) 5 MG tablet Take 5 mg by mouth daily.  . Cholecalciferol (VITAMIN D) 2000 UNITS CAPS Take 2,000 Units by mouth daily.  Marland Kitchen docusate sodium (COLACE) 100 MG capsule Take 100 mg by mouth 2 (two) times daily as needed for mild constipation.  . furosemide (LASIX) 40 MG tablet Take 40 mg by mouth 2 (two) times daily.   . hydrALAZINE (APRESOLINE) 50 MG tablet Take 1 tablet (50 mg total) by mouth 3 (three) times daily. (Every 8 hours)  . HYDROcodone-acetaminophen (NORCO) 10-325 MG tablet Take 1 tablet by mouth every 4 (four) hours.  . hydrocortisone cream 1 % Apply 1 application topically. Apply to inflamed anal area twice a day until healed  . levothyroxine (SYNTHROID, LEVOTHROID) 75 MCG tablet Take 75 mcg by mouth daily before breakfast.  . magnesium hydroxide (MILK OF MAGNESIA) 400 MG/5ML suspension Take 30 mLs by mouth as needed for mild constipation.  Marland Kitchen omeprazole (PRILOSEC) 20 MG capsule Take 20 mg by mouth daily.  . predniSONE (DELTASONE) 1 MG tablet Take 1 mg by mouth daily with breakfast.   . predniSONE (DELTASONE) 5 MG tablet Take 5 mg by mouth every morning.   . sennosides-docusate sodium (SENOKOT-S) 8.6-50 MG tablet Take 1 tablet by mouth 2 (two) times daily between meals.   . sertraline (ZOLOFT) 25 MG tablet Take 25 mg by mouth at bedtime.   . sertraline (ZOLOFT) 50 MG tablet Take 50 mg by mouth at bedtime. Take with 25 mg to equal a total of 75 mg.  . [DISCONTINUED] ciprofloxacin (CIPRO) 250 MG tablet Take 250 mg by mouth 2 (two) times daily. Stop date 09/08/16  . [DISCONTINUED] potassium chloride SA (K-DUR,KLOR-CON) 20 MEQ tablet Take 1 tablet (20 mEq total) by mouth 2 (two) times daily.  . [DISCONTINUED] saccharomyces boulardii (FLORASTOR) 250 MG capsule Take 250 mg by mouth 2 (two) times daily. Stop date 09/10/16   No facility-administered encounter medications on file as of 09/11/2016.     Review of Systems  Immunization History  Administered Date(s) Administered    . Influenza-Unspecified 11/01/2013  . Pneumococcal Polysaccharide-23 03/22/2004  . Tdap 11/21/2011  . Zoster 06/22/2009   Pertinent  Health Maintenance Due  Topic Date Due  . INFLUENZA VACCINE  10/14/2016 (Originally 08/14/2016)  . DEXA SCAN  Completed  . PNA vac Low Risk Adult  Completed   Fall Risk  09/03/2016 01/17/2016  Falls in the past year? Yes Yes  Number falls in past yr: 1 2 or more  Injury with Fall? Yes -   Functional Status Survey:    Vitals:   09/11/16 1312  BP: 120/78  Pulse: 64  Resp: 18  Temp: (!) 97.4 F (36.3 C)  SpO2: 97%  Weight: 138 lb 6.4 oz (62.8 kg)  Height: 5\' 4"  (1.626 m)   Body mass index is 23.76 kg/m. Physical Exam  Labs reviewed:  Recent Labs  01/17/16 1440  07/23/16 08/27/16 09/03/16  NA 141  < > 142 142 141  K 4.1  < > 3.6 3.6 3.8  CL 104  --   --   --   --   CO2 28  --   --   --   --   GLUCOSE 119*  --   --   --   --   BUN 36*  < > 42* 45* 52*  CREATININE 1.47*  < > 1.5* 1.7* 1.7*  CALCIUM 9.1  --   --   --   --   < > = values in this interval not displayed.  Recent Labs  05/28/16 07/23/16 09/03/16  AST 15 15 14   ALT 8 10 11   ALKPHOS 116 92 121    Recent Labs  01/17/16 1440  05/28/16 07/23/16 08/30/16  WBC 9.9  < > 7.5 6.0 8.7  NEUTROABS 8.1*  --   --   --   --   HGB 11.9*  < > 11.5* 11.5* 12.3  HCT 36.8  < > 35* 34* 37  MCV 87.0  --   --   --   --   PLT 199  < > 172 146* 225  < > = values in this interval not displayed. Lab Results  Component Value Date   TSH 8.23 (A) 09/03/2016   No results found for: HGBA1C No results found for: CHOL, HDL, LDLCALC, LDLDIRECT, TRIG, CHOLHDL  Significant Diagnostic Results in last 30 days:  No results found.  Assessment/Plan 1. Edema, unspecified type   2. Traumatic open wound of left lower leg, subsequent encounter   3. Primary osteoarthritis of left knee   4. Stage 3 chronic kidney disease     Family/ staff Communication:   Labs/tests ordered:

## 2016-09-11 NOTE — Assessment & Plan Note (Signed)
She takes Norco qid for her chronic pain for OA and Polymyalgia rheumatic.

## 2016-09-11 NOTE — Progress Notes (Signed)
Location:   [facility] Room Number: 15 Place of Service:  SNF (31) Provider: Lennie Odor Mast NP  Blanchie Serve, MD  Patient Care Team: Blanchie Serve, MD as PCP - General (Internal Medicine) Laurence Spates, MD as Consulting Physician (Gastroenterology) Hennie Duos, MD as Consulting Physician (Rheumatology) Corliss Parish, MD as Consulting Physician (Nephrology) Jettie Booze, MD as Consulting Physician (Cardiology) Glenna Fellows, MD as Attending Physician (Neurosurgery) Susa Day, MD as Consulting Physician (Orthopedic Surgery) Mast, Man X, NP as Nurse Practitioner (Internal Medicine)  Extended Emergency Contact Information Primary Emergency Contact: [facility] Address: [address]          [place], [place] [address] [name] of [place] Phone: [phone] Mobile Phone: [phone] Relation: Daughter Secondary Emergency Contact: Donnald Garre Address: [place], [place] Phone: [phone] Mobile Phone: [phone] Relation: Son  Code Status: DNR Goals of care: Advanced Directive information Advanced Directives 09/11/2016  Does Patient Have a Medical Advance Directive? Yes  Type of Advance Directive Out of facility DNR (pink MOST or yellow form)  Does patient want to make changes to medical advance directive? No - Patient declined  Copy of [place] in Chart? -  Would patient like information on creating a medical advance directive? -  Pre-existing out of facility DNR order (yellow form or pink MOST form) Yellow form placed in chart (order not valid for inpatient use)     Chief Complaint  Patient presents with  . Acute Visit    skin tear lower leg    HPI:  Pt is a 83 y.o. female seen today for an acute visit for non healing skin tear @l  eft later lower leg above the ankle, steri strips closures intact, dry wound bed, no bleeding or drainage, no peri-wound redness,  no pain when palpated. She takes Norco qid for her chronic pain for OA and Polymyalgia rheumatic.  Chronic edema is leg are not well managed, even if she is taking Furosemide 40mg  daily. She denied SOB, cough, sputum production, no O2 desaturation, she is afebrile. CKD creat 1.5-2.0   Past Medical History:  Diagnosis Date  . Anxiety   . Aortic stenosis    a. Mild AS by echo 03/2013.  Marland Kitchen Arthritis   . Bilateral knee pain 06/10/2012  . Cardiomegaly 12/23/2014  . Cataract   . Chronic kidney disease    Stage III/IV  . Colitis, collagenous 07/11/2011  . Corticosteroid dependence ([place]) 12/17/2014  . DDD (degenerative disc disease), thoracolumbar 12/23/2014  . Debility 12/23/2014  . Diverticulitis   . Diverticulosis of colon without hemorrhage 12/23/2014  . Dysphagia 12/23/2014  . Edema 12/23/2014  . Effusion of left knee 12/23/2014  . Enteritis 12/17/2014  . Hiatal hernia 12/23/2014  . HNP (herniated nucleus pulposus), lumbar 12/23/2014   L 4-5   . Hyperlipidemia   . Hypertension   . Hypokalemia   . Hypothyroidism   . IBS (irritable bowel syndrome) 12/23/2014  . Irritable bowel   . Noninfected skin tear of leg 12/23/2014   Left leg   . Osteoarthritis of left knee 12/23/2014  . Osteoporosis   . PMR (polymyalgia rheumatica) (HCC)    a. On chronic prednisone.  . Polymyalgia rheumatica ([place]) 07/11/2011  . Premature atrial contractions    a. Noted during adm 03/2013, corresponding with patient's palpitations.  . Scoliosis (and kyphoscoliosis), idiopathic 12/23/2014  . Spinal stenosis   . Spinal stenosis of  lumbar region 12/23/2014  . Symptomatic bradycardia    a. Adm 03/2013 - taken off clonidine and BB.  . T11 vertebral fracture ([place]) 12/23/2014  . Unstable gait 12/23/2014   Past Surgical History:  Procedure Laterality Date  . ABDOMINAL HYSTERECTOMY  1966  . APPENDECTOMY    . BACK SURGERY    . Breast biopsies    . CHOLECYSTECTOMY    . EYE SURGERY      Allergies  Allergen Reactions  .  Codeine Nausea And Vomiting  . Fosamax [Alendronate Sodium]     Unknown.   Dion Saucier [Calcitonin]     Unknown.  . Nsaids     Due to renal function.   . Quinolones Other (See Comments)    platelets  . Sulfa Antibiotics Nausea And Vomiting    Allergies as of 09/11/2016      Reactions   Codeine Nausea And Vomiting   Fosamax [alendronate Sodium]    Unknown.    Miacalcin [calcitonin]    Unknown.   Nsaids    Due to renal function.    Quinolones Other (See Comments)   platelets   Sulfa Antibiotics Nausea And Vomiting      Medication List       Accurate as of 09/11/16 11:59 PM. Always use your most recent med list.          ALPRAZolam 0.25 MG tablet Commonly known as:  XANAX Take 0.25 mg by mouth 4 (four) times daily.   amLODipine 5 MG tablet Commonly known as:  NORVASC Take 5 mg by mouth daily.   docusate sodium 100 MG capsule Commonly known as:  COLACE Take 100 mg by mouth 2 (two) times daily as needed for mild constipation.   furosemide 40 MG tablet Commonly known as:  LASIX Take 40 mg by mouth 2 (two) times daily.   hydrALAZINE 50 MG tablet Commonly known as:  APRESOLINE Take 1 tablet (50 mg total) by mouth 3 (three) times daily. (Every 8 hours)   HYDROcodone-acetaminophen 10-325 MG tablet Commonly known as:  NORCO Take 1 tablet by mouth every 4 (four) hours.   hydrocortisone cream 1 % Apply 1 application topically. Apply to inflamed anal area twice a day until healed   levothyroxine 75 MCG tablet Commonly known as:  SYNTHROID, LEVOTHROID Take 75 mcg by mouth daily before breakfast.   magnesium hydroxide 400 MG/5ML suspension Commonly known as:  MILK OF MAGNESIA Take 30 mLs by mouth as needed for mild constipation.   omeprazole 20 MG capsule Commonly known as:  PRILOSEC Take 20 mg by mouth daily.   predniSONE 1 MG tablet Commonly known as:  DELTASONE Take 1 mg by mouth daily with breakfast.   predniSONE 5 MG tablet Commonly known as:   DELTASONE Take 5 mg by mouth every morning.   sennosides-docusate sodium 8.6-50 MG tablet Commonly known as:  SENOKOT-S Take 1 tablet by mouth 2 (two) times daily between meals.   sertraline 50 MG tablet Commonly known as:  ZOLOFT Take 50 mg by mouth at bedtime. Take with 25 mg to equal a total of 75 mg.   sertraline 25 MG tablet Commonly known as:  ZOLOFT Take 25 mg by mouth at bedtime.   Vitamin D 2000 units Caps Take 2,000 Units by mouth daily.      ROS was provided by the patient, patient's daughter, charge nurse.  Review of Systems  Constitutional: Negative for activity change, appetite change, chills, diaphoresis, fatigue and fever.  HENT: Positive  for hearing loss. Negative for congestion.   Respiratory: Negative for cough, choking, chest tightness, shortness of breath and wheezing.   Cardiovascular: Positive for leg swelling. Negative for chest pain and palpitations.  Musculoskeletal: Positive for arthralgias, gait problem and myalgias.  Skin: Positive for wound.       Lower left lateral leg above the ankle.     Immunization History  Administered Date(s) Administered  . Influenza-Unspecified 11/01/2013  . Pneumococcal Polysaccharide-23 03/22/2004  . Tdap 11/21/2011  . Zoster 06/22/2009   Pertinent  Health Maintenance Due  Topic Date Due  . INFLUENZA VACCINE  10/14/2016 (Originally 08/14/2016)  . DEXA SCAN  Completed  . PNA vac Low Risk Adult  Completed   Fall Risk  09/03/2016 01/17/2016  Falls in the past year? Yes Yes  Number falls in past yr: 1 2 or more  Injury with Fall? Yes -   Functional Status Survey:    Vitals:   09/11/16 1312  BP: 120/78  Pulse: 64  Resp: 18  Temp: (!) 97.4 F (36.3 C)  SpO2: 97%  Weight: 138 lb 6.4 oz (62.8 kg)  Height: 5\' 4"  (1.626 m)   Body mass index is 23.76 kg/m. Physical Exam  Constitutional: She is oriented to person, place, and time. She appears well-developed and well-nourished.  HENT:  Head: Normocephalic and  atraumatic.  Neck: Normal range of motion. Neck supple. No JVD present.  Cardiovascular: Normal rate and regular rhythm.   Murmur heard. Pulmonary/Chest: Effort normal and breath sounds normal. No respiratory distress. She has no wheezes. She has no rales.  Musculoskeletal: She exhibits edema.  2+ edema BLE  Neurological: She is alert and oriented to person, place, and time.  Skin: Skin is warm and dry.  non healing skin tear @l  eft later lower leg above the ankle, steri strips closures intact, dry wound bed, no bleeding or drainage, no peri-wound redness, no pain when palpated.     Labs reviewed:  Recent Labs  01/17/16 1440  07/23/16 08/27/16 09/03/16  NA 141  < > 142 142 141  K 4.1  < > 3.6 3.6 3.8  CL 104  --   --   --   --   CO2 28  --   --   --   --   GLUCOSE 119*  --   --   --   --   BUN 36*  < > 42* 45* 52*  CREATININE 1.47*  < > 1.5* 1.7* 1.7*  CALCIUM 9.1  --   --   --   --   < > = values in this interval not displayed.  Recent Labs  05/28/16 07/23/16 09/03/16  AST 15 15 14   ALT 8 10 11   ALKPHOS 116 92 121    Recent Labs  01/17/16 1440  05/28/16 07/23/16 08/30/16  WBC 9.9  < > 7.5 6.0 8.7  NEUTROABS 8.1*  --   --   --   --   HGB 11.9*  < > 11.5* 11.5* 12.3  HCT 36.8  < > 35* 34* 37  MCV 87.0  --   --   --   --   PLT 199  < > 172 146* 225  < > = values in this interval not displayed. Lab Results  Component Value Date   TSH 8.23 (A) 09/03/2016   No results found for: HGBA1C No results found for: CHOL, HDL, LDLCALC, LDLDIRECT, TRIG, CHOLHDL  Significant Diagnostic Results in last 30 days:  No results found.  Assessment/Plan: Edema 08/27/16 BNP 119 09/11/16 no developing s/s of CHF. Noted 2+ edema BLE, mainly in lower legs, ankles, feet, increase Furosemide 40mg  bid/qd for better control of swelling to promote skin tear healing.   Traumatic open wound of left lower leg non healing skin tear @l  eft later lower leg above the ankle, steri strips closures  intact, dry wound bed, no bleeding or drainage, no peri-wound redness, no pain when palpated. Increase Furosemide to decrease swelling to facilitate healing. Observe.   Osteoarthritis of left knee She takes Norco qid for her chronic pain for OA and Polymyalgia rheumatic.   Chronic kidney disease, stage III/IV 08/27/16 creat 1.7 09/11/16 increase Furosemide 40mg  bid/daily to decrease swelling to promote skin tear healing, Rvs B explained to the patient and POA, POA is in agreement of plan of care, update BMP in one week.     Family/ staff Communication: plan of care reviewed with the patient, POA, and charge nurse.   Labs/tests ordered:   Time spend 25 minutes

## 2016-09-11 NOTE — Assessment & Plan Note (Signed)
non healing skin tear @l  eft later lower leg above the ankle, steri strips closures intact, dry wound bed, no bleeding or drainage, no peri-wound redness, no pain when palpated. Increase Furosemide to decrease swelling to facilitate healing. Observe.

## 2016-09-12 DIAGNOSIS — R52 Pain, unspecified: Secondary | ICD-10-CM | POA: Diagnosis not present

## 2016-09-17 DIAGNOSIS — E309 Disorder of puberty, unspecified: Secondary | ICD-10-CM | POA: Diagnosis not present

## 2016-09-17 LAB — BASIC METABOLIC PANEL
BUN: 38 — AB (ref 4–21)
CREATININE: 1.6 — AB (ref 0.5–1.1)
Glucose: 126
Potassium: 3.7 (ref 3.4–5.3)
Sodium: 142 (ref 137–147)

## 2016-09-20 ENCOUNTER — Encounter: Payer: Self-pay | Admitting: Nurse Practitioner

## 2016-09-20 ENCOUNTER — Non-Acute Institutional Stay (SKILLED_NURSING_FACILITY): Payer: Medicare Other | Admitting: Nurse Practitioner

## 2016-09-20 DIAGNOSIS — M25561 Pain in right knee: Secondary | ICD-10-CM

## 2016-09-20 DIAGNOSIS — G8929 Other chronic pain: Secondary | ICD-10-CM

## 2016-09-20 DIAGNOSIS — K5900 Constipation, unspecified: Secondary | ICD-10-CM

## 2016-09-20 DIAGNOSIS — I10 Essential (primary) hypertension: Secondary | ICD-10-CM | POA: Diagnosis not present

## 2016-09-20 DIAGNOSIS — F4321 Adjustment disorder with depressed mood: Secondary | ICD-10-CM | POA: Diagnosis not present

## 2016-09-20 DIAGNOSIS — R609 Edema, unspecified: Secondary | ICD-10-CM

## 2016-09-20 DIAGNOSIS — M25562 Pain in left knee: Secondary | ICD-10-CM

## 2016-09-20 DIAGNOSIS — M353 Polymyalgia rheumatica: Secondary | ICD-10-CM | POA: Diagnosis not present

## 2016-09-20 NOTE — Assessment & Plan Note (Signed)
Pain is managed with Norco 10/325mg  po q4h.

## 2016-09-20 NOTE — Assessment & Plan Note (Signed)
Improved BLE swelling, continue Furosemide 40mg  bid.

## 2016-09-20 NOTE — Assessment & Plan Note (Signed)
Mood is stable, continue Sertraline 75mg  po qd.

## 2016-09-20 NOTE — Assessment & Plan Note (Signed)
Controlled, continue  Hydralazine 50mg  tid, Furosemide 40mg  bid, Amlodipine 5mg  qd.

## 2016-09-20 NOTE — Progress Notes (Signed)
Location:  [facility] Room Number: 61 Place of Service:  SNF (31) Provider:  Elody Kleinsasser, Manxie  NP  Blanchie Serve, MD  Patient Care Team: Blanchie Serve, MD as PCP - General (Internal Medicine) Laurence Spates, MD as Consulting Physician (Gastroenterology) Hennie Duos, MD as Consulting Physician (Rheumatology) Corliss Parish, MD as Consulting Physician (Nephrology) Jettie Booze, MD as Consulting Physician (Cardiology) Glenna Fellows, MD as Attending Physician (Neurosurgery) Susa Day, MD as Consulting Physician (Orthopedic Surgery) Vrishank Moster X, NP as Nurse Practitioner (Internal Medicine)  Extended Emergency Contact Information Primary Emergency Contact: [facility] Address: [address]          [place], [place] [address] [name] [place] [address] [place] [address] Phone: [phone] Mobile Phone: [phone] Relation: Daughter Secondary Emergency Contact: Donnald Garre Address: [place], [place] Phone: [phone] Mobile Phone: [phone] Relation: Son  Code Status:  DNR Goals of care: Advanced Directive information Advanced Directives 09/20/2016  Does Patient Have a Medical Advance Directive? Yes  Type of Advance Directive Out of facility DNR (pink MOST or yellow form)  Does patient want to make changes to medical advance directive? No - Patient declined  Copy of [place] in Chart? -  Would patient like information on creating a medical advance directive? -  Pre-existing out of facility DNR order (yellow form or pink MOST form) Yellow form placed in chart (order not valid for inpatient use)     Chief Complaint  Patient presents with  . Medical Management of Chronic Issues    HPI:  Pt is a 83 y.o. female seen today for medical management of chronic diseases.     Hx of constipation, not well managed, taking Senokot S I bid. She takes Norco 10/325mg  4h for arthritic pain  in knees, lower back. She takes Prednisone for Poymyalgia rheumatica. Her mood is stable, taking Sertraline 75mg  qd. Alprazolam 0.25mg  qid. Hypothyroidism: managed with Levothyroxine was increased to 40mcg, TSH 11.55 08/27/16 and TSH 8.23 09/03/16, update TSH in 8 weeks pending. Blood pressure is controlled, she takes Hydralazine 50mg  tid, Furosemide 40mg  bid, Amlodipine 5mg  qd.    Past Medical History:  Diagnosis Date  . Anxiety   . Aortic stenosis    a. Mild AS by echo 03/2013.  Marland Kitchen Arthritis   . Bilateral knee pain 06/10/2012  . Cardiomegaly 12/23/2014  . Cataract   . Chronic kidney disease    Stage III/IV  . Colitis, collagenous 07/11/2011  . Corticosteroid dependence ([place]) 12/17/2014  . DDD (degenerative disc disease), thoracolumbar 12/23/2014  . Debility 12/23/2014  . Diverticulitis   . Diverticulosis of colon without hemorrhage 12/23/2014  . Dysphagia 12/23/2014  . Edema 12/23/2014  . Effusion of left knee 12/23/2014  . Enteritis 12/17/2014  . Hiatal hernia 12/23/2014  . HNP (herniated nucleus pulposus), lumbar 12/23/2014   L 4-5   . Hyperlipidemia   . Hypertension   . Hypokalemia   . Hypothyroidism   . IBS (irritable bowel syndrome) 12/23/2014  . Irritable bowel   . Noninfected skin tear of leg 12/23/2014   Left leg   . Osteoarthritis of left knee 12/23/2014  . Osteoporosis   . PMR (polymyalgia rheumatica) (HCC)    a. On chronic prednisone.  . Polymyalgia rheumatica ([place] [facility]) 07/11/2011  . Premature atrial contractions    a. Noted during adm 03/2013, corresponding with patient's palpitations.  . Scoliosis (and kyphoscoliosis), idiopathic 12/23/2014  . Spinal  stenosis   . Spinal stenosis of lumbar region 12/23/2014  . Symptomatic bradycardia    a. Adm 03/2013 - taken off clonidine and BB.  . T11 vertebral fracture ([place]) 12/23/2014  . Unstable gait 12/23/2014   Past Surgical History:  Procedure Laterality Date  . ABDOMINAL HYSTERECTOMY  1966  . APPENDECTOMY    . BACK SURGERY    . Breast  biopsies    . CHOLECYSTECTOMY    . EYE SURGERY      Allergies  Allergen Reactions  . Codeine Nausea And Vomiting  . Fosamax [Alendronate Sodium]     Unknown.   Dion Saucier [Calcitonin]     Unknown.  . Nsaids     Due to renal function.   . Quinolones Other (See Comments)    platelets  . Sulfa Antibiotics Nausea And Vomiting    Outpatient Encounter Prescriptions as of 09/20/2016  Medication Sig  . ALPRAZolam (XANAX) 0.25 MG tablet Take 0.25 mg by mouth 4 (four) times daily.  Marland Kitchen amLODipine (NORVASC) 5 MG tablet Take 5 mg by mouth daily.  . bisacodyl (DULCOLAX) 10 MG suppository Place 10 mg rectally daily as needed for moderate constipation.  . Cholecalciferol (VITAMIN D) 2000 UNITS CAPS Take 2,000 Units by mouth daily.  Marland Kitchen docusate sodium (COLACE) 100 MG capsule Take 100 mg by mouth 2 (two) times daily as needed for mild constipation.  . furosemide (LASIX) 40 MG tablet Take 40 mg by mouth 2 (two) times daily.   . hydrALAZINE (APRESOLINE) 50 MG tablet Take 1 tablet (50 mg total) by mouth 3 (three) times daily. (Every 8 hours)  . HYDROcodone-acetaminophen (NORCO) 10-325 MG tablet Take 1 tablet by mouth every 4 (four) hours.  . hydrocortisone cream 1 % Apply 1 application topically. Apply to inflamed anal area twice a day until healed  . levothyroxine (SYNTHROID, LEVOTHROID) 75 MCG tablet Take 75 mcg by mouth daily before breakfast.  . magnesium hydroxide (MILK OF MAGNESIA) 400 MG/5ML suspension Take 30 mLs by mouth as needed for mild constipation.  Marland Kitchen omeprazole (PRILOSEC) 20 MG capsule Take 20 mg by mouth daily.  . potassium chloride SA (K-DUR,KLOR-CON) 20 MEQ tablet Take 40 mEq by mouth 2 (two) times daily.  . predniSONE (DELTASONE) 1 MG tablet Take 1 mg by mouth daily with breakfast.   . predniSONE (DELTASONE) 5 MG tablet Take 5 mg by mouth every morning.   . sennosides-docusate sodium (SENOKOT-S) 8.6-50 MG tablet Take 1 tablet by mouth 2 (two) times daily between meals.   . sertraline  (ZOLOFT) 25 MG tablet Take 25 mg by mouth at bedtime.   . sertraline (ZOLOFT) 50 MG tablet Take 50 mg by mouth at bedtime. Take with 25 mg to equal a total of 75 mg.   No facility-administered encounter medications on file as of 09/20/2016.     Review of Systems  Constitutional: Positive for fatigue. Negative for activity change, appetite change, chills, diaphoresis and fever.  HENT: Positive for hearing loss. Negative for congestion, trouble swallowing and voice change.   Eyes: Negative for visual disturbance.  Respiratory: Negative for cough, chest tightness and shortness of breath.   Cardiovascular: Positive for leg swelling. Negative for chest pain and palpitations.       Mainly left lower leg.   Gastrointestinal: Positive for constipation. Negative for abdominal distention, abdominal pain, nausea and vomiting.  Genitourinary: Negative for difficulty urinating, dysuria, frequency and urgency.  Musculoskeletal: Positive for arthralgias, back pain and gait problem.  R+L knee pain.   Skin: Positive for wound. Negative for color change, pallor and rash.       Left lower leg skin tear is scabbed over. Brownish pigmentation anterior BLE  Neurological: Negative for tremors, speech difficulty, weakness and headaches.  Psychiatric/Behavioral: Negative for agitation, behavioral problems, confusion and sleep disturbance. The patient is not nervous/anxious.     Immunization History  Administered Date(s) Administered  . Influenza-Unspecified 11/01/2013  . Pneumococcal Polysaccharide-23 03/22/2004  . Tdap 11/21/2011  . Zoster 06/22/2009   Pertinent  Health Maintenance Due  Topic Date Due  . INFLUENZA VACCINE  10/14/2016 (Originally 08/14/2016)  . DEXA SCAN  Completed  . PNA vac Low Risk Adult  Completed   Fall Risk  09/03/2016 01/17/2016  Falls in the past year? Yes Yes  Number falls in past yr: 1 2 or more  Injury with Fall? Yes -   Functional Status Survey:    Vitals:   09/20/16  1524  BP: 130/80  Pulse: 82  Resp: 20  Temp: (!) 96.5 F (35.8 C)  SpO2: 95%  Weight: 140 lb 3.2 oz (63.6 kg)  Height: 5\' 4"  (1.626 m)   Body mass index is 24.07 kg/m. Physical Exam  Constitutional: She appears well-developed and well-nourished. No distress.  HENT:  Head: Normocephalic and atraumatic.  Eyes: Pupils are equal, round, and reactive to light. Conjunctivae and EOM are normal.  Neck: Normal range of motion. Neck supple. No JVD present. No thyromegaly present.  Cardiovascular: Normal rate, regular rhythm and normal heart sounds.   No murmur heard. Pulmonary/Chest: Effort normal and breath sounds normal. No respiratory distress. She has no wheezes. She has no rales.  Abdominal: Soft. Bowel sounds are normal. She exhibits no distension. There is no tenderness. There is no rebound and no guarding.  Musculoskeletal: She exhibits edema and tenderness.  Trace Left lower leg edema  Lymphadenopathy:    She has no cervical adenopathy.  Neurological: She is alert. No cranial nerve deficit. She exhibits normal muscle tone.  Skin: Skin is warm and dry. She is not diaphoretic.  Left lateral lower leg skin tear is scabbed over.   Psychiatric: She has a normal mood and affect. Her behavior is normal. Judgment and thought content normal.    Labs reviewed:  Recent Labs  01/17/16 1440  07/23/16 08/27/16 09/03/16  NA 141  < > 142 142 141  K 4.1  < > 3.6 3.6 3.8  CL 104  --   --   --   --   CO2 28  --   --   --   --   GLUCOSE 119*  --   --   --   --   BUN 36*  < > 42* 45* 52*  CREATININE 1.47*  < > 1.5* 1.7* 1.7*  CALCIUM 9.1  --   --   --   --   < > = values in this interval not displayed.  Recent Labs  05/28/16 07/23/16 09/03/16  AST 15 15 14   ALT 8 10 11   ALKPHOS 116 92 121    Recent Labs  01/17/16 1440  05/28/16 07/23/16 08/30/16  WBC 9.9  < > 7.5 6.0 8.7  NEUTROABS 8.1*  --   --   --   --   HGB 11.9*  < > 11.5* 11.5* 12.3  HCT 36.8  < > 35* 34* 37  MCV 87.0  --    --   --   --  PLT 199  < > 172 146* 225  < > = values in this interval not displayed. Lab Results  Component Value Date   TSH 8.23 (A) 09/03/2016   No results found for: HGBA1C No results found for: CHOL, HDL, LDLCALC, LDLDIRECT, TRIG, CHOLHDL  Significant Diagnostic Results in last 30 days:  No results found.  Assessment/Plan: Constipation Constipated, last BM 3 days ago, will increase Senokot S to II bid, observe the patient.   Bilateral knee pain Pain is managed with Norco 10/325mg  po q4h.   Polymyalgia rheumatica (HCC) Steroid dependent, takes Prednisone 1mg  with breakfast, 5mg  qam.   Situational depression Mood is stable, continue Sertraline 75mg  po qd.   HTN (hypertension) Controlled, continue  Hydralazine 50mg  tid, Furosemide 40mg  bid, Amlodipine 5mg  qd.   Edema Improved BLE swelling, continue Furosemide 40mg  bid.      Family/ staff Communication: plan of care reviewed with the patient and charge nurse.   Labs/tests ordered:  none  Time spend 25 minutes.

## 2016-09-20 NOTE — Assessment & Plan Note (Signed)
Steroid dependent, takes Prednisone 1mg  with breakfast, 5mg  qam.

## 2016-09-20 NOTE — Assessment & Plan Note (Signed)
Constipated, last BM 3 days ago, will increase Senokot S to II bid, observe the patient.

## 2016-10-02 DIAGNOSIS — R52 Pain, unspecified: Secondary | ICD-10-CM | POA: Diagnosis not present

## 2016-10-03 ENCOUNTER — Other Ambulatory Visit: Payer: Self-pay

## 2016-10-03 MED ORDER — HYDROCODONE-ACETAMINOPHEN 10-325 MG PO TABS: 1.0000 | ORAL_TABLET | ORAL | 0 refills | Status: AC

## 2016-10-08 DIAGNOSIS — N183 Chronic kidney disease, stage 3 (moderate): Secondary | ICD-10-CM | POA: Diagnosis not present

## 2016-10-08 DIAGNOSIS — I350 Nonrheumatic aortic (valve) stenosis: Secondary | ICD-10-CM | POA: Diagnosis not present

## 2016-10-08 DIAGNOSIS — Z9181 History of falling: Secondary | ICD-10-CM | POA: Diagnosis not present

## 2016-10-08 DIAGNOSIS — A419 Sepsis, unspecified organism: Secondary | ICD-10-CM | POA: Diagnosis not present

## 2016-10-08 DIAGNOSIS — K582 Mixed irritable bowel syndrome: Secondary | ICD-10-CM | POA: Diagnosis not present

## 2016-10-08 DIAGNOSIS — R001 Bradycardia, unspecified: Secondary | ICD-10-CM | POA: Diagnosis not present

## 2016-10-08 DIAGNOSIS — F329 Major depressive disorder, single episode, unspecified: Secondary | ICD-10-CM | POA: Diagnosis not present

## 2016-10-08 DIAGNOSIS — K219 Gastro-esophageal reflux disease without esophagitis: Secondary | ICD-10-CM | POA: Diagnosis not present

## 2016-10-08 DIAGNOSIS — F4321 Adjustment disorder with depressed mood: Secondary | ICD-10-CM | POA: Diagnosis not present

## 2016-10-08 DIAGNOSIS — R29898 Other symptoms and signs involving the musculoskeletal system: Secondary | ICD-10-CM | POA: Diagnosis not present

## 2016-10-08 DIAGNOSIS — I10 Essential (primary) hypertension: Secondary | ICD-10-CM | POA: Diagnosis not present

## 2016-10-08 DIAGNOSIS — S52502A Unspecified fracture of the lower end of left radius, initial encounter for closed fracture: Secondary | ICD-10-CM | POA: Diagnosis not present

## 2016-10-08 DIAGNOSIS — E039 Hypothyroidism, unspecified: Secondary | ICD-10-CM | POA: Diagnosis not present

## 2016-10-08 DIAGNOSIS — G8929 Other chronic pain: Secondary | ICD-10-CM | POA: Diagnosis not present

## 2016-10-08 DIAGNOSIS — I4891 Unspecified atrial fibrillation: Secondary | ICD-10-CM | POA: Diagnosis not present

## 2016-10-08 DIAGNOSIS — F419 Anxiety disorder, unspecified: Secondary | ICD-10-CM | POA: Diagnosis not present

## 2016-10-08 DIAGNOSIS — M6281 Muscle weakness (generalized): Secondary | ICD-10-CM | POA: Diagnosis not present

## 2016-10-08 DIAGNOSIS — I517 Cardiomegaly: Secondary | ICD-10-CM | POA: Diagnosis not present

## 2016-10-08 DIAGNOSIS — E876 Hypokalemia: Secondary | ICD-10-CM | POA: Diagnosis not present

## 2016-10-08 DIAGNOSIS — R238 Other skin changes: Secondary | ICD-10-CM | POA: Diagnosis not present

## 2016-10-08 DIAGNOSIS — R2689 Other abnormalities of gait and mobility: Secondary | ICD-10-CM | POA: Diagnosis not present

## 2016-10-08 DIAGNOSIS — R1312 Dysphagia, oropharyngeal phase: Secondary | ICD-10-CM | POA: Diagnosis not present

## 2016-10-08 DIAGNOSIS — M353 Polymyalgia rheumatica: Secondary | ICD-10-CM | POA: Diagnosis not present

## 2016-10-08 DIAGNOSIS — M5442 Lumbago with sciatica, left side: Secondary | ICD-10-CM | POA: Diagnosis not present

## 2016-10-08 DIAGNOSIS — E872 Acidosis: Secondary | ICD-10-CM | POA: Diagnosis not present

## 2016-10-11 DIAGNOSIS — F4321 Adjustment disorder with depressed mood: Secondary | ICD-10-CM | POA: Diagnosis not present

## 2016-10-11 DIAGNOSIS — M6281 Muscle weakness (generalized): Secondary | ICD-10-CM | POA: Diagnosis not present

## 2016-10-11 DIAGNOSIS — S52502A Unspecified fracture of the lower end of left radius, initial encounter for closed fracture: Secondary | ICD-10-CM | POA: Diagnosis not present

## 2016-10-11 DIAGNOSIS — N183 Chronic kidney disease, stage 3 (moderate): Secondary | ICD-10-CM | POA: Diagnosis not present

## 2016-10-11 DIAGNOSIS — R1312 Dysphagia, oropharyngeal phase: Secondary | ICD-10-CM | POA: Diagnosis not present

## 2016-10-11 DIAGNOSIS — Z9181 History of falling: Secondary | ICD-10-CM | POA: Diagnosis not present

## 2016-10-15 DIAGNOSIS — A419 Sepsis, unspecified organism: Secondary | ICD-10-CM | POA: Diagnosis not present

## 2016-10-15 DIAGNOSIS — K582 Mixed irritable bowel syndrome: Secondary | ICD-10-CM | POA: Diagnosis not present

## 2016-10-15 DIAGNOSIS — G8929 Other chronic pain: Secondary | ICD-10-CM | POA: Diagnosis not present

## 2016-10-15 DIAGNOSIS — I350 Nonrheumatic aortic (valve) stenosis: Secondary | ICD-10-CM | POA: Diagnosis not present

## 2016-10-15 DIAGNOSIS — E872 Acidosis: Secondary | ICD-10-CM | POA: Diagnosis not present

## 2016-10-15 DIAGNOSIS — R1312 Dysphagia, oropharyngeal phase: Secondary | ICD-10-CM | POA: Diagnosis not present

## 2016-10-15 DIAGNOSIS — N183 Chronic kidney disease, stage 3 (moderate): Secondary | ICD-10-CM | POA: Diagnosis not present

## 2016-10-15 DIAGNOSIS — S52502A Unspecified fracture of the lower end of left radius, initial encounter for closed fracture: Secondary | ICD-10-CM | POA: Diagnosis not present

## 2016-10-15 DIAGNOSIS — E039 Hypothyroidism, unspecified: Secondary | ICD-10-CM | POA: Diagnosis not present

## 2016-10-15 DIAGNOSIS — Z9181 History of falling: Secondary | ICD-10-CM | POA: Diagnosis not present

## 2016-10-15 DIAGNOSIS — F329 Major depressive disorder, single episode, unspecified: Secondary | ICD-10-CM | POA: Diagnosis not present

## 2016-10-15 DIAGNOSIS — I517 Cardiomegaly: Secondary | ICD-10-CM | POA: Diagnosis not present

## 2016-10-15 DIAGNOSIS — M5442 Lumbago with sciatica, left side: Secondary | ICD-10-CM | POA: Diagnosis not present

## 2016-10-15 DIAGNOSIS — R001 Bradycardia, unspecified: Secondary | ICD-10-CM | POA: Diagnosis not present

## 2016-10-15 DIAGNOSIS — K219 Gastro-esophageal reflux disease without esophagitis: Secondary | ICD-10-CM | POA: Diagnosis not present

## 2016-10-15 DIAGNOSIS — E876 Hypokalemia: Secondary | ICD-10-CM | POA: Diagnosis not present

## 2016-10-15 DIAGNOSIS — M353 Polymyalgia rheumatica: Secondary | ICD-10-CM | POA: Diagnosis not present

## 2016-10-15 DIAGNOSIS — F419 Anxiety disorder, unspecified: Secondary | ICD-10-CM | POA: Diagnosis not present

## 2016-10-15 DIAGNOSIS — I4891 Unspecified atrial fibrillation: Secondary | ICD-10-CM | POA: Diagnosis not present

## 2016-10-15 DIAGNOSIS — R238 Other skin changes: Secondary | ICD-10-CM | POA: Diagnosis not present

## 2016-10-15 DIAGNOSIS — R29898 Other symptoms and signs involving the musculoskeletal system: Secondary | ICD-10-CM | POA: Diagnosis not present

## 2016-10-15 DIAGNOSIS — M6281 Muscle weakness (generalized): Secondary | ICD-10-CM | POA: Diagnosis not present

## 2016-10-15 DIAGNOSIS — I10 Essential (primary) hypertension: Secondary | ICD-10-CM | POA: Diagnosis not present

## 2016-10-15 DIAGNOSIS — R2689 Other abnormalities of gait and mobility: Secondary | ICD-10-CM | POA: Diagnosis not present

## 2016-10-15 DIAGNOSIS — F4321 Adjustment disorder with depressed mood: Secondary | ICD-10-CM | POA: Diagnosis not present

## 2016-10-17 ENCOUNTER — Non-Acute Institutional Stay (SKILLED_NURSING_FACILITY): Payer: Medicare Other | Admitting: Internal Medicine

## 2016-10-17 ENCOUNTER — Encounter: Payer: Self-pay | Admitting: Internal Medicine

## 2016-10-17 DIAGNOSIS — F329 Major depressive disorder, single episode, unspecified: Secondary | ICD-10-CM

## 2016-10-17 DIAGNOSIS — M353 Polymyalgia rheumatica: Secondary | ICD-10-CM | POA: Diagnosis not present

## 2016-10-17 DIAGNOSIS — E039 Hypothyroidism, unspecified: Secondary | ICD-10-CM | POA: Diagnosis not present

## 2016-10-17 DIAGNOSIS — I10 Essential (primary) hypertension: Secondary | ICD-10-CM | POA: Diagnosis not present

## 2016-10-17 DIAGNOSIS — F32A Depression, unspecified: Secondary | ICD-10-CM

## 2016-10-17 NOTE — Progress Notes (Signed)
Location:  [facility] Room Number: 83 Place of Service:  SNF [phone]) Provider:  Usha Slager Molly Maduro, Denorris Reust MD  Patient Care Team: Blanchie Serve, MD as PCP - General (Internal Medicine) Laurence Spates, MD as Consulting Physician (Gastroenterology) Hennie Duos, MD as Consulting Physician (Rheumatology) Corliss Parish, MD as Consulting Physician (Nephrology) Jettie Booze, MD as Consulting Physician (Cardiology) Glenna Fellows, MD as Attending Physician (Neurosurgery) Susa Day, MD as Consulting Physician (Orthopedic Surgery) Mast, Man X, NP as Nurse Practitioner (Internal Medicine)  Extended Emergency Contact Information Primary Emergency Contact: [facility] Address: [address]          [place], [place] [address] [name] Phone: [phone] Mobile Phone: [phone] Relation: Daughter Secondary Emergency Contact: Donnald Garre Address: [place], [place] Phone: [phone] Mobile Phone: [phone] Relation: Son  Code Status:  DNR Goals of care: Advanced Directive information Advanced Directives 10/17/2016  Does Patient Have a Medical Advance Directive? Yes  Type of Advance Directive Out of facility DNR (pink MOST or yellow form)  Does patient want to make changes to medical advance directive? No - Patient declined  Copy of [place] in Chart? -  Would patient like information on creating a medical advance directive? -  Pre-existing out of facility DNR order (yellow form or pink MOST form) Yellow form placed in chart (order not valid for inpatient use)     Chief Complaint  Patient presents with  . Medical Management of Chronic Issues    Routine Visit     HPI:  Pt is a 83 y.o. female seen today for medical management of chronic diseases. Christina Cabrera is being followed by palliative care team for her arthritis pain. Her pain is controlled with  current regimen. Christina Cabrera is out of bed daily. No new concern from nursing.   Hypothyroidism- currently taking levothyroxine 75 mcg daily, tolerating well  Chronic depression- mood overall stable, taking sertraline 75 mg daily and alprazolam 0.25 mg qid.   PMR- pain persists but controlled. Taking daily prednisone with norco 10-325 mg q4h  Hypertension- stable BP on review. Currently on amlodipine 5 mg daily, hydralazine 50 mg tid and furosemide 40 mg bid   Past Medical History:  Diagnosis Date  . Anxiety   . Aortic stenosis    a. Mild AS by echo 03/2013.  Marland Kitchen Arthritis   . Bilateral knee pain 06/10/2012  . Cardiomegaly 12/23/2014  . Cataract   . Chronic kidney disease    Stage III/IV  . Colitis, collagenous 07/11/2011  . Corticosteroid dependence ([place]) 12/17/2014  . DDD (degenerative disc disease), thoracolumbar 12/23/2014  . Debility 12/23/2014  . Diverticulitis   . Diverticulosis of colon without hemorrhage 12/23/2014  . Dysphagia 12/23/2014  . Edema 12/23/2014  . Effusion of left knee 12/23/2014  . Enteritis 12/17/2014  . Hiatal hernia 12/23/2014  . HNP (herniated nucleus pulposus), lumbar 12/23/2014   L 4-5   . Hyperlipidemia   . Hypertension   . Hypokalemia   . Hypothyroidism   . IBS (irritable bowel syndrome) 12/23/2014  . Irritable bowel   . Noninfected skin tear of leg 12/23/2014   Left leg   . Osteoarthritis of left knee 12/23/2014  . Osteoporosis   . PMR (polymyalgia rheumatica) (HCC)    a. On chronic prednisone.  . Polymyalgia rheumatica ([facility]) 07/11/2011  . Premature atrial contractions    a. Noted  during adm 03/2013, corresponding with patient's palpitations.  . Scoliosis (and kyphoscoliosis), idiopathic 12/23/2014  . Spinal stenosis   . Spinal stenosis of lumbar region 12/23/2014  . Symptomatic bradycardia    a. Adm 03/2013 - taken off clonidine and BB.  . T11 vertebral fracture ([place]) 12/23/2014  . Unstable gait 12/23/2014   Past Surgical History:  Procedure Laterality Date    . ABDOMINAL HYSTERECTOMY  1966  . APPENDECTOMY    . BACK SURGERY    . Breast biopsies    . CHOLECYSTECTOMY    . EYE SURGERY      Allergies  Allergen Reactions  . Codeine Nausea And Vomiting  . Fosamax [Alendronate Sodium]     Unknown.   Dion Saucier [Calcitonin]     Unknown.  . Nsaids     Due to renal function.   . Quinolones Other (See Comments)    platelets  . Sulfa Antibiotics Nausea And Vomiting    Outpatient Encounter Prescriptions as of 10/17/2016  Medication Sig  . ALPRAZolam (XANAX) 0.25 MG tablet Take 0.25 mg by mouth 4 (four) times daily.  Marland Kitchen amLODipine (NORVASC) 5 MG tablet Take 5 mg by mouth daily.  . Cholecalciferol (VITAMIN D) 2000 UNITS CAPS Take 2,000 Units by mouth daily.  Marland Kitchen docusate sodium (COLACE) 100 MG capsule Take 100 mg by mouth 2 (two) times daily as needed for mild constipation.  . furosemide (LASIX) 40 MG tablet Take 40 mg by mouth 2 (two) times daily.   . hydrALAZINE (APRESOLINE) 50 MG tablet Take 1 tablet (50 mg total) by mouth 3 (three) times daily. (Every 8 hours)  . HYDROcodone-acetaminophen (NORCO) 10-325 MG tablet Take 1 tablet by mouth every 4 (four) hours.  . hydrocortisone cream 1 % Apply 1 application topically. Apply to inflamed anal area twice a day until healed  . levothyroxine (SYNTHROID, LEVOTHROID) 75 MCG tablet Take 75 mcg by mouth daily before breakfast.  . magnesium hydroxide (MILK OF MAGNESIA) 400 MG/5ML suspension Take 30 mLs by mouth as needed for mild constipation.  Marland Kitchen omeprazole (PRILOSEC) 20 MG capsule Take 20 mg by mouth daily.  . potassium chloride SA (K-DUR,KLOR-CON) 20 MEQ tablet Take 40 mEq by mouth 2 (two) times daily.  . predniSONE (DELTASONE) 1 MG tablet Take 1 mg by mouth daily with breakfast.   . predniSONE (DELTASONE) 5 MG tablet Take 5 mg by mouth every morning.   . sennosides-docusate sodium (SENOKOT-S) 8.6-50 MG tablet Take 2 tablets by mouth 2 (two) times daily between meals.   . sertraline (ZOLOFT) 25 MG tablet  Take 25 mg by mouth at bedtime.   . sertraline (ZOLOFT) 50 MG tablet Take 50 mg by mouth at bedtime. Take with 25 mg to equal a total of 75 mg.  . [DISCONTINUED] bisacodyl (DULCOLAX) 10 MG suppository Place 10 mg rectally daily as needed for moderate constipation.   No facility-administered encounter medications on file as of 10/17/2016.     Review of Systems  Constitutional: Positive for fatigue. Negative for appetite change, chills, diaphoresis and fever.       Christina Cabrera feeds herself. Christina Cabrera is compliant with her medications  HENT: Positive for hearing loss and trouble swallowing. Negative for congestion, ear discharge, ear pain, mouth sores and rhinorrhea.   Eyes: Positive for photophobia. Negative for visual disturbance.       Has corrective glasses  Respiratory: Positive for cough and shortness of breath. Negative for wheezing.        Dyspnea with exertion, progressively worsening  Cardiovascular: Positive for palpitations and leg swelling. Negative for chest pain.  Gastrointestinal: Negative for abdominal pain, blood in stool, constipation, diarrhea, nausea and vomiting.       Has a bowel movement everyday with help of medication  Genitourinary: Negative for dysuria.       Christina Cabrera is continent with her bowel and bladder  Musculoskeletal: Positive for arthralgias, back pain and gait problem. Negative for joint swelling.  Skin: Negative for rash.  Neurological: Positive for numbness. Negative for dizziness, seizures and headaches.       Has numbness and tingling intermittently to left forearm and hand  Hematological: Bruises/bleeds easily.  Psychiatric/Behavioral: Positive for dysphoric mood. Negative for agitation and behavioral problems. The patient is nervous/anxious.     Immunization History  Administered Date(s) Administered  . Influenza-Unspecified 11/01/2013  . Pneumococcal Polysaccharide-23 03/22/2004  . Tdap 11/21/2011  . Zoster 06/22/2009   Pertinent  Health Maintenance Due    Topic Date Due  . INFLUENZA VACCINE  10/23/2016 (Originally 08/14/2016)  . DEXA SCAN  Completed  . PNA vac Low Risk Adult  Completed   Fall Risk  09/03/2016 01/17/2016  Falls in the past year? Yes Yes  Number falls in past yr: 1 2 or more  Injury with Fall? Yes -   Functional Status Survey:    Vitals:   10/17/16 1344  BP: 124/69  Pulse: 63  Resp: 18  Temp: 97.6 F (36.4 C)  TempSrc: Oral  SpO2: 95%  Weight: 135 lb 9.6 oz (61.5 kg)  Height: 5\' 4"  (1.626 m)   Body mass index is 23.28 kg/m.   Wt Readings from Last 3 Encounters:  10/17/16 135 lb 9.6 oz (61.5 kg)  09/20/16 140 lb 3.2 oz (63.6 kg)  09/11/16 138 lb 6.4 oz (62.8 kg)   Physical Exam  Constitutional: Christina Cabrera is oriented to person, place, and time. Christina Cabrera appears well-developed and well-nourished. No distress.  HENT:  Head: Normocephalic and atraumatic.  Eyes: Pupils are equal, round, and reactive to light.  Neck: Normal range of motion. Neck supple.  Cardiovascular: Normal rate and regular rhythm.   Murmur heard. Systolic murmur  Pulmonary/Chest: Effort normal. No respiratory distress. Christina Cabrera has no wheezes. Christina Cabrera has no rales.  decreased air entry to lung bases.   Abdominal: Soft. Christina Cabrera exhibits no distension. There is no tenderness. There is no guarding.  Musculoskeletal: Christina Cabrera exhibits edema.  Limited range of motion to both shoulder right > left, severe arthritis changes to her fingers, ROM left wrist limited, chronic back pain, 1+ pitting leg edema  Lymphadenopathy:    Christina Cabrera has no cervical adenopathy.  Neurological: Christina Cabrera is alert and oriented to person, place, and time.  Skin: Skin is warm and dry. Christina Cabrera is not diaphoretic.  Easy bruising, senile purpura present, chronic skin changes to lower legs  Psychiatric: Christina Cabrera has a normal mood and affect.  Stable anxiety    Labs reviewed:  Recent Labs  01/17/16 1440  08/27/16 09/03/16 09/17/16  NA 141  < > 142 141 142  K 4.1  < > 3.6 3.8 3.7  CL 104  --   --   --   --   CO2  28  --   --   --   --   GLUCOSE 119*  --   --   --   --   BUN 36*  < > 45* 52* 38*  CREATININE 1.47*  < > 1.7* 1.7* 1.6*  CALCIUM 9.1  --   --   --   --   < > =  values in this interval not displayed.  Recent Labs  05/28/16 07/23/16 09/03/16  AST 15 15 14   ALT 8 10 11   ALKPHOS 116 92 121    Recent Labs  01/17/16 1440  05/28/16 07/23/16 08/30/16  WBC 9.9  < > 7.5 6.0 8.7  NEUTROABS 8.1*  --   --   --   --   HGB 11.9*  < > 11.5* 11.5* 12.3  HCT 36.8  < > 35* 34* 37  MCV 87.0  --   --   --   --   PLT 199  < > 172 146* 225  < > = values in this interval not displayed. Lab Results  Component Value Date   TSH 8.23 (A) 09/03/2016   No results found for: HGBA1C No results found for: CHOL, HDL, LDLCALC, LDLDIRECT, TRIG, CHOLHDL  Significant Diagnostic Results in last 30 days:  No results found.  Assessment/Plan  Hypothyroidism Lab Results  Component Value Date   TSH 8.23 (A) 09/03/2016   Currently on levothyroxine 75 mcg daily, tolerating well. Check TSH with free T4  Chronic depression mood overall stable, continue sertraline 75 mg daily and alprazolam 0.25 mg qid.   PMR Continue prednisone with norco 10-325 mg q4h and monitor  Hypertension Continue amlodipine 5 mg daily, hydralazine 50 mg tid and furosemide 40 mg bid   Family/ staff Communication: reviewed care plan with patient, her daughter and charge nurse.    Labs/tests ordered:  TSH, free T4    Blanchie Serve, MD Internal Medicine [facility] [address] [place], [place] [address] Cell Phone (Monday-Friday 8 am - 5 pm): [phone] On Call: [phone] and follow prompts after 5 pm and on weekends Office Phone: [phone] Office Fax: [phone]

## 2016-10-22 DIAGNOSIS — E039 Hypothyroidism, unspecified: Secondary | ICD-10-CM | POA: Diagnosis not present

## 2016-10-22 LAB — TSH: TSH: 4.23 (ref 0.41–5.90)

## 2016-11-11 DIAGNOSIS — R52 Pain, unspecified: Secondary | ICD-10-CM | POA: Diagnosis not present

## 2016-11-15 ENCOUNTER — Non-Acute Institutional Stay (SKILLED_NURSING_FACILITY): Payer: Medicare Other | Admitting: Nurse Practitioner

## 2016-11-15 ENCOUNTER — Encounter: Payer: Self-pay | Admitting: Nurse Practitioner

## 2016-11-15 DIAGNOSIS — R609 Edema, unspecified: Secondary | ICD-10-CM | POA: Diagnosis not present

## 2016-11-15 DIAGNOSIS — E039 Hypothyroidism, unspecified: Secondary | ICD-10-CM

## 2016-11-15 DIAGNOSIS — R079 Chest pain, unspecified: Secondary | ICD-10-CM

## 2016-11-15 DIAGNOSIS — F329 Major depressive disorder, single episode, unspecified: Secondary | ICD-10-CM | POA: Diagnosis not present

## 2016-11-15 DIAGNOSIS — R52 Pain, unspecified: Secondary | ICD-10-CM | POA: Diagnosis not present

## 2016-11-15 DIAGNOSIS — F32A Depression, unspecified: Secondary | ICD-10-CM

## 2016-11-15 DIAGNOSIS — I10 Essential (primary) hypertension: Secondary | ICD-10-CM | POA: Diagnosis not present

## 2016-11-15 DIAGNOSIS — M150 Primary generalized (osteo)arthritis: Secondary | ICD-10-CM

## 2016-11-15 DIAGNOSIS — M159 Polyosteoarthritis, unspecified: Secondary | ICD-10-CM

## 2016-11-15 NOTE — Progress Notes (Signed)
Location:  [facility] Room Number: 77 Place of Service:  SNF (31) Provider:  Thunder [place], Manxie  NP  Blanchie Serve, MD  Patient Care Team: Blanchie Serve, MD as PCP - General (Internal Medicine) Laurence Spates, MD as Consulting Physician (Gastroenterology) Hennie Duos, MD as Consulting Physician (Rheumatology) Corliss Parish, MD as Consulting Physician (Nephrology) Jettie Booze, MD as Consulting Physician (Cardiology) Glenna Fellows, MD as Attending Physician (Neurosurgery) Susa Day, MD as Consulting Physician (Orthopedic Surgery) Lundyn Coste X, NP as Nurse Practitioner (Internal Medicine)  Extended Emergency Contact Information Primary Emergency Contact: [facility] Address: [address]          [place], [place] [address] [name] of [place] Phone: [phone] Mobile Phone: [phone] Relation: Daughter Secondary Emergency Contact: Donnald Garre Address: [place], [place] of [place] Phone: [phone] Mobile Phone: [phone] Relation: Son  Code Status:  DNR Goals of care: Advanced Directive information Advanced Directives 11/15/2016  Does Patient Have a Medical Advance Directive? Yes  Type of Advance Directive Out of facility DNR (pink MOST or yellow form)  Does patient want to make changes to medical advance directive? No - Patient declined  Copy of [place] in Chart? -  Would patient like information on creating a medical advance directive? -  Pre-existing out of facility DNR order (yellow form or pink MOST form) Yellow form placed in chart (order not valid for inpatient use)     Chief Complaint  Patient presents with  . Medical Management of Chronic Issues    f/u HTN, depression, hypothyroidism    HPI:  Pt is a 83 y.o. female seen today for medical management of chronic diseases.     Palliative Care NP Wynell Balloon reported the patient has an episode  of chest pain 11/14/16, the patient stated it was more in her shoulders, L>R,  sudden onset, severer sharp pain lasted about an hour or so, subsided when she woke up from phenergan, had nausea and vomiting associated with the event, but she denied sense of impending doom, diaphoresis, SOB, dizziness, or palpitation. She has history of aortic stenosis, cardiomegaly, HTN, PAC, GERD, thoracolumbar DDD, scoliosis/Kyphosis, and polymyalgia rheumatic.    The patient has history of HTN, blood pressure is controlled, taking Hydralazine 50mg  tid, Amlodipine 5mg , her chronic depression/anxiety is managed with Sertraline 75mg  qd, Alprazolam 0.5mg  qid. Chronic multiple sites arthritic pain is managed with Norco 10/325 q4h po,  Prednisone 6mg  qd, Hypothyroidism, controlled on Levothyroxine 27mcg daily, last TSH 4.23 10/22/16, chronic BLE edema, trace edema in ankles noted today, she takes Furosemide 40mg  bid, she resides in SNF for care assistance, ambulates with walker in her room, transfer independently.    Past Medical History:  Diagnosis Date  . Anxiety   . Aortic stenosis    a. Mild AS by echo 03/2013.  Marland Kitchen Arthritis   . Bilateral knee pain 06/10/2012  . Cardiomegaly 12/23/2014  . Cataract   . Chronic kidney disease    Stage III/IV  . Colitis, collagenous 07/11/2011  . Corticosteroid dependence ([facility]) 12/17/2014  . DDD (degenerative disc disease), thoracolumbar 12/23/2014  . Debility 12/23/2014  . Diverticulitis   . Diverticulosis of colon without hemorrhage 12/23/2014  . Dysphagia 12/23/2014  . Edema 12/23/2014  . Effusion of left knee 12/23/2014  . Enteritis 12/17/2014  . Hiatal hernia 12/23/2014  . HNP (herniated nucleus pulposus), lumbar 12/23/2014   L 4-5   .  Hyperlipidemia   . Hypertension   . Hypokalemia   . Hypothyroidism   . IBS (irritable bowel syndrome) 12/23/2014  . Irritable bowel   . Noninfected skin tear of leg 12/23/2014   Left leg   . Osteoarthritis of left knee 12/23/2014  . Osteoporosis     . PMR (polymyalgia rheumatica) (HCC)    a. On chronic prednisone.  . Polymyalgia rheumatica ([facility]) 07/11/2011  . Premature atrial contractions    a. Noted during adm 03/2013, corresponding with patient's palpitations.  . Scoliosis (and kyphoscoliosis), idiopathic 12/23/2014  . Spinal stenosis   . Spinal stenosis of lumbar region 12/23/2014  . Symptomatic bradycardia    a. Adm 03/2013 - taken off clonidine and BB.  . T11 vertebral fracture ([facility]) 12/23/2014  . Unstable gait 12/23/2014   Past Surgical History:  Procedure Laterality Date  . ABDOMINAL HYSTERECTOMY  1966  . APPENDECTOMY    . BACK SURGERY    . Breast biopsies    . CHOLECYSTECTOMY    . EYE SURGERY      Allergies  Allergen Reactions  . Codeine Nausea And Vomiting  . Fosamax [Alendronate Sodium]     Unknown.   Dion Saucier [Calcitonin]     Unknown.  . Nsaids     Due to renal function.   . Quinolones Other (See Comments)    platelets  . Sulfa Antibiotics Nausea And Vomiting    Outpatient Encounter Prescriptions as of 11/15/2016  Medication Sig  . ALPRAZolam (XANAX) 0.25 MG tablet Take 0.25 mg by mouth 4 (four) times daily.  Marland Kitchen amLODipine (NORVASC) 5 MG tablet Take 5 mg by mouth daily.  . Cholecalciferol (VITAMIN D) 2000 UNITS CAPS Take 2,000 Units by mouth daily.  Marland Kitchen docusate sodium (COLACE) 100 MG capsule Take 100 mg by mouth 2 (two) times daily as needed for mild constipation.  . furosemide (LASIX) 40 MG tablet Take 40 mg by mouth 2 (two) times daily.   . hydrALAZINE (APRESOLINE) 50 MG tablet Take 1 tablet (50 mg total) by mouth 3 (three) times daily. (Every 8 hours)  . HYDROcodone-acetaminophen (NORCO) 10-325 MG tablet Take 1 tablet by mouth every 4 (four) hours.  . hydrocortisone cream 1 % Apply 1 application topically. Apply to inflamed anal area twice a day until healed  . levothyroxine (SYNTHROID, LEVOTHROID) 75 MCG tablet Take 75 mcg by mouth daily before breakfast.  . magnesium hydroxide (MILK OF MAGNESIA) 400  MG/5ML suspension Take 30 mLs by mouth as needed for mild constipation.  Marland Kitchen omeprazole (PRILOSEC) 20 MG capsule Take 20 mg by mouth daily.  . potassium chloride SA (K-DUR,KLOR-CON) 20 MEQ tablet Take 40 mEq by mouth 2 (two) times daily.  . predniSONE (DELTASONE) 1 MG tablet Take 1 mg by mouth daily with breakfast.   . predniSONE (DELTASONE) 5 MG tablet Take 5 mg by mouth every morning.   . promethazine (PHENERGAN) 25 MG/ML injection Inject 12.5 mg into the muscle every 4 (four) hours as needed for nausea or vomiting.  . sennosides-docusate sodium (SENOKOT-S) 8.6-50 MG tablet Take 2 tablets by mouth 2 (two) times daily between meals.   . sertraline (ZOLOFT) 25 MG tablet Take 25 mg by mouth at bedtime.   . sertraline (ZOLOFT) 50 MG tablet Take 50 mg by mouth at bedtime. Take with 25 mg to equal a total of 75 mg.   No facility-administered encounter medications on file as of 11/15/2016.     Review of Systems  Constitutional: Negative for activity change, appetite  change, chills, diaphoresis, fatigue and fever.  HENT: Positive for hearing loss. Negative for congestion, trouble swallowing and voice change.   Eyes: Negative for visual disturbance.  Respiratory: Negative for cough, choking, chest tightness, shortness of breath and wheezing.        Reported chest/upper back pain 11/14/16  Cardiovascular: Positive for leg swelling. Negative for chest pain and palpitations.  Gastrointestinal: Negative for abdominal distention, abdominal pain, constipation, diarrhea, nausea and vomiting.  Genitourinary: Negative for difficulty urinating and dysuria.       Urinary leakage.   Musculoskeletal: Positive for arthralgias, back pain and gait problem.  Skin: Negative for color change, pallor, rash and wound.  Neurological: Negative for tremors, speech difficulty, weakness and headaches.  Psychiatric/Behavioral: Negative for agitation, behavioral problems, confusion and sleep disturbance. The patient is  nervous/anxious.     Immunization History  Administered Date(s) Administered  . Influenza-Unspecified 11/01/2013, 11/06/2016  . Pneumococcal Conjugate-13 03/22/2004  . Pneumococcal Polysaccharide-23 03/22/2004  . Tdap 11/21/2011  . Zoster 06/22/2009   Pertinent  Health Maintenance Due  Topic Date Due  . INFLUENZA VACCINE  Completed  . DEXA SCAN  Completed  . PNA vac Low Risk Adult  Completed   Fall Risk  09/03/2016 01/17/2016  Falls in the past year? Yes Yes  Number falls in past yr: 1 2 or more  Injury with Fall? Yes -   Functional Status Survey:    Vitals:   11/15/16 0856  BP: 128/80  Pulse: 74  Resp: 17  Temp: (!) 97.5 F (36.4 C)  Weight: 135 lb 9.6 oz (61.5 kg)  Height: 5\' 4"  (1.626 m)   Body mass index is 23.28 kg/m. Physical Exam  Constitutional: She is oriented to person, place, and time. She appears well-developed and well-nourished.  HENT:  Head: Normocephalic and atraumatic.  Eyes: Pupils are equal, round, and reactive to light. Conjunctivae and EOM are normal.  Neck: Normal range of motion. Neck supple. No JVD present. No thyromegaly present.  Cardiovascular: Normal rate and regular rhythm.   Murmur heard. Pulmonary/Chest: Effort normal and breath sounds normal. She has no wheezes. She has no rales.  Abdominal: Soft. Bowel sounds are normal. She exhibits no distension. There is no tenderness.  Musculoskeletal: She exhibits edema and tenderness.  Decreased ROM R+L shoulders, left wrist, arthritic change of fingers, chronic lower back pain-worsens with movement. Trace edema in BLE  Neurological: She is alert and oriented to person, place, and time. She exhibits normal muscle tone. Coordination normal.  Skin: Skin is warm and dry. No rash noted. No erythema.  Psychiatric: Her behavior is normal. Judgment and thought content normal.  Anxious     Labs reviewed:  Recent Labs  01/17/16 1440  08/27/16 09/03/16 09/17/16  NA 141  < > 142 141 142  K 4.1  < >  3.6 3.8 3.7  CL 104  --   --   --   --   CO2 28  --   --   --   --   GLUCOSE 119*  --   --   --   --   BUN 36*  < > 45* 52* 38*  CREATININE 1.47*  < > 1.7* 1.7* 1.6*  CALCIUM 9.1  --   --   --   --   < > = values in this interval not displayed.  Recent Labs  05/28/16 07/23/16 09/03/16  AST 15 15 14   ALT 8 10 11   ALKPHOS 116 92 121  Recent Labs  01/17/16 1440  05/28/16 07/23/16 08/30/16  WBC 9.9  < > 7.5 6.0 8.7  NEUTROABS 8.1*  --   --   --   --   HGB 11.9*  < > 11.5* 11.5* 12.3  HCT 36.8  < > 35* 34* 37  MCV 87.0  --   --   --   --   PLT 199  < > 172 146* 225  < > = values in this interval not displayed. Lab Results  Component Value Date   TSH 4.23 10/22/2016   No results found for: HGBA1C No results found for: CHOL, HDL, LDLCALC, LDLDIRECT, TRIG, CHOLHDL  Significant Diagnostic Results in last 30 days:  No results found.  Assessment/Plan HTN (hypertension) HTN, blood pressure is controlled, continue  Hydralazine 50mg  tid, Amlodipine 5mg   Osteoarthritis (arthritis due to wear and tear of joints) Chronic multiple sites arthritic pain is managed with Norco 10/325 q4h po,  Prednisone 6mg  qd, she resides in SNF for care assistance, ambulates with walker in her room, transfer independently.    Chronic depression depression/anxiety is stable, continue  Sertraline 75mg  qd, Alprazolam 0.5mg  qid.   Edema  chronic BLE edema, trace edema in ankles noted today, continue Furosemide 40mg  bid   Hypothyroidism Hypothyroidism, controlled, continue Levothyroxine 58mcg daily, last TSH 4.23 10/22/16,  Chest pain Palliative Care NP Wynell Balloon reported the patient has an episode of chest pain 11/14/16, the patient stated it was more in her shoulders, L>R,  sudden onset, severer sharp pain lasted about an hour or so, subsided when she woke up from phenergan, had nausea and vomiting associated with the event, but she denied sense of impending doom, diaphoresis, SOB, dizziness, or  palpitation. She has history of aortic stenosis, cardiomegaly, HTN, PAC, GERD, thoracolumbar DDD, scoliosis/Kyphosis, and polymyalgia rheumatic.    Will obtain EKG, CBC, CMP, have NTG prn and Morphine 5mg  q4h prn x 72 hours available to her for now     Family/ staff Communication: plan of care reviewed with the patient and charge nurse.   Labs/tests ordered:  EKG, update CBC CMP  Time spend 25 minutes

## 2016-11-15 NOTE — Assessment & Plan Note (Addendum)
Palliative Care NP Wynell Balloon reported the patient has an episode of chest pain 11/14/16, the patient stated it was more in her shoulders, L>R,  sudden onset, severer sharp pain lasted about an hour or so, subsided when she woke up from phenergan, had nausea and vomiting associated with the event, but she denied sense of impending doom, diaphoresis, SOB, dizziness, or palpitation. She has history of aortic stenosis, cardiomegaly, HTN, PAC, GERD, thoracolumbar DDD, scoliosis/Kyphosis, and polymyalgia rheumatic.    Will obtain EKG, CBC, CMP, have NTG prn and Morphine 5mg  q4h prn x 72 hours available to her for now

## 2016-11-15 NOTE — Assessment & Plan Note (Addendum)
depression/anxiety is stable, continue  Sertraline 75mg  qd, Alprazolam 0.5mg  qid.

## 2016-11-15 NOTE — Assessment & Plan Note (Signed)
Chronic multiple sites arthritic pain is managed with Norco 10/325 q4h po,  Prednisone 6mg  qd, she resides in SNF for care assistance, ambulates with walker in her room, transfer independently.

## 2016-11-15 NOTE — Assessment & Plan Note (Signed)
Hypothyroidism, controlled, continue Levothyroxine 73mcg daily, last TSH 4.23 10/22/16,

## 2016-11-15 NOTE — Assessment & Plan Note (Signed)
chronic BLE edema, trace edema in ankles noted today, continue Furosemide 40mg  bid

## 2016-11-15 NOTE — Assessment & Plan Note (Signed)
HTN, blood pressure is controlled, continue  Hydralazine 50mg  tid, Amlodipine 5mg 

## 2016-11-18 DIAGNOSIS — R52 Pain, unspecified: Secondary | ICD-10-CM | POA: Diagnosis not present

## 2016-11-19 ENCOUNTER — Encounter: Payer: Self-pay | Admitting: Nurse Practitioner

## 2016-11-19 DIAGNOSIS — I129 Hypertensive chronic kidney disease with stage 1 through stage 4 chronic kidney disease, or unspecified chronic kidney disease: Secondary | ICD-10-CM | POA: Diagnosis not present

## 2016-11-19 DIAGNOSIS — R6889 Other general symptoms and signs: Secondary | ICD-10-CM | POA: Diagnosis not present

## 2016-11-21 ENCOUNTER — Other Ambulatory Visit: Payer: Self-pay | Admitting: *Deleted

## 2016-11-21 DIAGNOSIS — E876 Hypokalemia: Secondary | ICD-10-CM | POA: Diagnosis not present

## 2016-11-21 LAB — BASIC METABOLIC PANEL: POTASSIUM: 3.5 (ref 3.4–5.3)

## 2016-11-26 DIAGNOSIS — M353 Polymyalgia rheumatica: Secondary | ICD-10-CM | POA: Diagnosis not present

## 2016-11-26 DIAGNOSIS — R1312 Dysphagia, oropharyngeal phase: Secondary | ICD-10-CM | POA: Diagnosis not present

## 2016-11-26 DIAGNOSIS — I10 Essential (primary) hypertension: Secondary | ICD-10-CM | POA: Diagnosis not present

## 2016-11-26 DIAGNOSIS — I517 Cardiomegaly: Secondary | ICD-10-CM | POA: Diagnosis not present

## 2016-11-26 DIAGNOSIS — F4321 Adjustment disorder with depressed mood: Secondary | ICD-10-CM | POA: Diagnosis not present

## 2016-11-26 DIAGNOSIS — E039 Hypothyroidism, unspecified: Secondary | ICD-10-CM | POA: Diagnosis not present

## 2016-11-26 DIAGNOSIS — Z9181 History of falling: Secondary | ICD-10-CM | POA: Diagnosis not present

## 2016-11-26 DIAGNOSIS — E872 Acidosis: Secondary | ICD-10-CM | POA: Diagnosis not present

## 2016-11-26 DIAGNOSIS — R2689 Other abnormalities of gait and mobility: Secondary | ICD-10-CM | POA: Diagnosis not present

## 2016-11-26 DIAGNOSIS — M6281 Muscle weakness (generalized): Secondary | ICD-10-CM | POA: Diagnosis not present

## 2016-11-26 DIAGNOSIS — G8929 Other chronic pain: Secondary | ICD-10-CM | POA: Diagnosis not present

## 2016-11-26 DIAGNOSIS — I350 Nonrheumatic aortic (valve) stenosis: Secondary | ICD-10-CM | POA: Diagnosis not present

## 2016-11-26 DIAGNOSIS — I4891 Unspecified atrial fibrillation: Secondary | ICD-10-CM | POA: Diagnosis not present

## 2016-11-26 DIAGNOSIS — M5442 Lumbago with sciatica, left side: Secondary | ICD-10-CM | POA: Diagnosis not present

## 2016-11-26 DIAGNOSIS — R238 Other skin changes: Secondary | ICD-10-CM | POA: Diagnosis not present

## 2016-11-26 DIAGNOSIS — R001 Bradycardia, unspecified: Secondary | ICD-10-CM | POA: Diagnosis not present

## 2016-11-26 DIAGNOSIS — F329 Major depressive disorder, single episode, unspecified: Secondary | ICD-10-CM | POA: Diagnosis not present

## 2016-11-26 DIAGNOSIS — F419 Anxiety disorder, unspecified: Secondary | ICD-10-CM | POA: Diagnosis not present

## 2016-11-26 DIAGNOSIS — K219 Gastro-esophageal reflux disease without esophagitis: Secondary | ICD-10-CM | POA: Diagnosis not present

## 2016-11-26 DIAGNOSIS — A419 Sepsis, unspecified organism: Secondary | ICD-10-CM | POA: Diagnosis not present

## 2016-11-26 DIAGNOSIS — K582 Mixed irritable bowel syndrome: Secondary | ICD-10-CM | POA: Diagnosis not present

## 2016-11-26 DIAGNOSIS — S52502A Unspecified fracture of the lower end of left radius, initial encounter for closed fracture: Secondary | ICD-10-CM | POA: Diagnosis not present

## 2016-11-26 DIAGNOSIS — E876 Hypokalemia: Secondary | ICD-10-CM | POA: Diagnosis not present

## 2016-11-26 DIAGNOSIS — R29898 Other symptoms and signs involving the musculoskeletal system: Secondary | ICD-10-CM | POA: Diagnosis not present

## 2016-11-26 DIAGNOSIS — N183 Chronic kidney disease, stage 3 (moderate): Secondary | ICD-10-CM | POA: Diagnosis not present

## 2016-12-03 DIAGNOSIS — S52502A Unspecified fracture of the lower end of left radius, initial encounter for closed fracture: Secondary | ICD-10-CM | POA: Diagnosis not present

## 2016-12-03 DIAGNOSIS — N183 Chronic kidney disease, stage 3 (moderate): Secondary | ICD-10-CM | POA: Diagnosis not present

## 2016-12-03 DIAGNOSIS — Z9181 History of falling: Secondary | ICD-10-CM | POA: Diagnosis not present

## 2016-12-03 DIAGNOSIS — F4321 Adjustment disorder with depressed mood: Secondary | ICD-10-CM | POA: Diagnosis not present

## 2016-12-03 DIAGNOSIS — M6281 Muscle weakness (generalized): Secondary | ICD-10-CM | POA: Diagnosis not present

## 2016-12-03 DIAGNOSIS — R1312 Dysphagia, oropharyngeal phase: Secondary | ICD-10-CM | POA: Diagnosis not present

## 2016-12-17 ENCOUNTER — Encounter: Payer: Self-pay | Admitting: Internal Medicine

## 2016-12-17 ENCOUNTER — Non-Acute Institutional Stay (SKILLED_NURSING_FACILITY): Payer: Medicare Other | Admitting: Internal Medicine

## 2016-12-17 DIAGNOSIS — N183 Chronic kidney disease, stage 3 unspecified: Secondary | ICD-10-CM

## 2016-12-17 DIAGNOSIS — F418 Other specified anxiety disorders: Secondary | ICD-10-CM | POA: Diagnosis not present

## 2016-12-17 DIAGNOSIS — R1310 Dysphagia, unspecified: Secondary | ICD-10-CM | POA: Diagnosis not present

## 2016-12-17 DIAGNOSIS — K449 Diaphragmatic hernia without obstruction or gangrene: Secondary | ICD-10-CM

## 2016-12-17 DIAGNOSIS — G894 Chronic pain syndrome: Secondary | ICD-10-CM | POA: Diagnosis not present

## 2016-12-17 DIAGNOSIS — I10 Essential (primary) hypertension: Secondary | ICD-10-CM | POA: Diagnosis not present

## 2016-12-17 DIAGNOSIS — E039 Hypothyroidism, unspecified: Secondary | ICD-10-CM | POA: Diagnosis not present

## 2016-12-17 NOTE — Progress Notes (Signed)
Location:  [place] Room Number: 7 Place of Service:  SNF [phone]) Provider:  Blanchie Serve MD  Blanchie Serve, MD  Patient Care Team: Blanchie Serve, MD as PCP - General (Internal Medicine) Laurence Spates, MD as Consulting Physician (Gastroenterology) Hennie Duos, MD as Consulting Physician (Rheumatology) Corliss Parish, MD as Consulting Physician (Nephrology) Jettie Booze, MD as Consulting Physician (Cardiology) Glenna Fellows, MD as Attending Physician (Neurosurgery) Susa Day, MD as Consulting Physician (Orthopedic Surgery) Mast, Man X, NP as Nurse Practitioner (Internal Medicine)  Extended Emergency Contact Information Primary Emergency Contact: [facility] Address: [address]          [place],  [address] [name] of [place] Phone: [phone] Mobile Phone: [phone] Relation: Daughter Secondary Emergency Contact: Donnald Garre Address: [place], [place] of [place] Phone: [phone] Mobile Phone: [phone] Relation: Son  Code Status:  DNR  Goals of care: Advanced Directive information Advanced Directives 11/15/2016  Does Patient Have a Medical Advance Directive? Yes  Type of Advance Directive Out of facility DNR (pink MOST or yellow form)  Does patient want to make changes to medical advance directive? No - Patient declined  Copy of [place] in Chart? -  Would patient like information on creating a medical advance directive? -  Pre-existing out of facility DNR order (yellow form or pink MOST form) Yellow form placed in chart (order not valid for inpatient use)     Chief Complaint  Patient presents with  . Medical Management of Chronic Issues    HPI:  Pt is a 83 y.o. female seen today for medical management of chronic diseases. She complaints of generalized pain. She has dyspnea with exertion. Denies any concern otherwise. She needs  assistance with her dressing and toileting. No fall reported. Being followed by SLP team with dysphagia.    Past Medical History:  Diagnosis Date  . Anxiety   . Aortic stenosis    a. Mild AS by echo 03/2013.  Marland Kitchen Arthritis   . Bilateral knee pain 06/10/2012  . Cardiomegaly 12/23/2014  . Cataract   . Chronic kidney disease    Stage III/IV  . Colitis, collagenous 07/11/2011  . Corticosteroid dependence ([place]) 12/17/2014  . DDD (degenerative disc disease), thoracolumbar 12/23/2014  . Debility 12/23/2014  . Diverticulitis   . Diverticulosis of colon without hemorrhage 12/23/2014  . Dysphagia 12/23/2014  . Edema 12/23/2014  . Effusion of left knee 12/23/2014  . Enteritis 12/17/2014  . Hiatal hernia 12/23/2014  . HNP (herniated nucleus pulposus), lumbar 12/23/2014   L 4-5   . Hyperlipidemia   . Hypertension   . Hypokalemia   . Hypothyroidism   . IBS (irritable bowel syndrome) 12/23/2014  . Irritable bowel   . Noninfected skin tear of leg 12/23/2014   Left leg   . Osteoarthritis of left knee 12/23/2014  . Osteoporosis   . PMR (polymyalgia rheumatica) (HCC)    a. On chronic prednisone.  . Polymyalgia rheumatica ([place]) 07/11/2011  . Premature atrial contractions    a. Noted during adm 03/2013, corresponding with patient's palpitations.  . Scoliosis (and kyphoscoliosis), idiopathic 12/23/2014  . Spinal stenosis   . Spinal stenosis of lumbar region 12/23/2014  . Symptomatic bradycardia    a. Adm 03/2013 - taken off clonidine and BB.  . T11 vertebral fracture ([place]) 12/23/2014  . Unstable gait 12/23/2014   Past Surgical History:  Procedure Laterality Date  .  ABDOMINAL HYSTERECTOMY  1966  . APPENDECTOMY    . BACK SURGERY    . Breast biopsies    . CHOLECYSTECTOMY    . EYE SURGERY      Allergies  Allergen Reactions  . Codeine Nausea And Vomiting  . Fosamax [Alendronate Sodium]     Unknown.   Dion Saucier [Calcitonin]     Unknown.  . Nsaids     Due to renal function.   . Quinolones Other (See  Comments)    platelets  . Sulfa Antibiotics Nausea And Vomiting    Outpatient Encounter Medications as of 12/17/2016  Medication Sig  . ALPRAZolam (XANAX) 0.25 MG tablet Take 0.25 mg by mouth 4 (four) times daily.  Marland Kitchen amLODipine (NORVASC) 5 MG tablet Take 5 mg by mouth daily.  . Cholecalciferol (VITAMIN D) 2000 UNITS CAPS Take 2,000 Units by mouth daily.  Marland Kitchen docusate sodium (COLACE) 100 MG capsule Take 100 mg by mouth 2 (two) times daily as needed for mild constipation.  . furosemide (LASIX) 40 MG tablet Take 40 mg by mouth 2 (two) times daily.   . hydrALAZINE (APRESOLINE) 50 MG tablet Take 1 tablet (50 mg total) by mouth 3 (three) times daily. (Every 8 hours)  . HYDROcodone-acetaminophen (NORCO) 10-325 MG tablet Take 1 tablet by mouth every 4 (four) hours.  . hydrocortisone cream 1 % Apply 1 application topically. Apply to inflamed anal area twice a day until healed  . levothyroxine (SYNTHROID, LEVOTHROID) 75 MCG tablet Take 75 mcg by mouth daily before breakfast.  . magnesium hydroxide (MILK OF MAGNESIA) 400 MG/5ML suspension Take 30 mLs by mouth as needed for mild constipation.  Marland Kitchen omeprazole (PRILOSEC) 20 MG capsule Take 20 mg by mouth daily.  . potassium chloride SA (K-DUR,KLOR-CON) 20 MEQ tablet Take 40 mEq by mouth 2 (two) times daily.  . predniSONE (DELTASONE) 1 MG tablet Take 1 mg by mouth daily with breakfast.   . predniSONE (DELTASONE) 5 MG tablet Take 5 mg by mouth every morning.   . promethazine (PHENERGAN) 25 MG/ML injection Inject 12.5 mg into the muscle every 4 (four) hours as needed for nausea or vomiting.  . sennosides-docusate sodium (SENOKOT-S) 8.6-50 MG tablet Take 2 tablets by mouth 2 (two) times daily between meals.   . sertraline (ZOLOFT) 25 MG tablet Take 25 mg by mouth at bedtime.   . sertraline (ZOLOFT) 50 MG tablet Take 50 mg by mouth at bedtime. Take with 25 mg to equal a total of 75 mg.   No facility-administered encounter medications on file as of 12/17/2016.      Review of Systems  Constitutional: Negative for chills and fever.  HENT: Positive for hearing loss. Negative for congestion, ear pain, mouth sores, rhinorrhea, sore throat and trouble swallowing.   Eyes: Positive for visual disturbance. Negative for pain and itching.  Respiratory: Positive for choking and shortness of breath. Negative for cough.   Cardiovascular: Positive for leg swelling. Negative for chest pain and palpitations.  Gastrointestinal: Negative for abdominal pain, diarrhea, nausea and vomiting.  Genitourinary: Negative for dysuria.  Musculoskeletal: Positive for arthralgias, back pain, gait problem and myalgias.  Skin: Negative for rash and wound.  Neurological: Positive for weakness. Negative for dizziness and headaches.  Psychiatric/Behavioral: Negative for confusion.    Immunization History  Administered Date(s) Administered  . Influenza-Unspecified 11/01/2013, 11/06/2016  . Pneumococcal Conjugate-13 03/22/2004  . Pneumococcal Polysaccharide-23 03/22/2004  . Tdap 11/21/2011  . Zoster 06/22/2009   Pertinent  Health Maintenance Due  Topic Date  Due  . INFLUENZA VACCINE  Completed  . DEXA SCAN  Completed  . PNA vac Low Risk Adult  Completed   Fall Risk  09/03/2016 01/17/2016  Falls in the past year? Yes Yes  Number falls in past yr: 1 2 or more  Injury with Fall? Yes -   Functional Status Survey:    Vitals:   12/17/16 1501  BP: 120/60  Pulse: 64  Resp: 20  Temp: (!) 97 F (36.1 C)  Weight: 136 lb (61.7 kg)  Height: 5\' 4"  (1.626 m)   Body mass index is 23.34 kg/m.   Wt Readings from Last 3 Encounters:  12/17/16 136 lb (61.7 kg)  11/15/16 135 lb 9.6 oz (61.5 kg)  10/17/16 135 lb 9.6 oz (61.5 kg)   Physical Exam  Constitutional: She is oriented to person, place, and time. She appears well-developed.  HENT:  Head: Normocephalic and atraumatic.  Nose: Nose normal.  Mouth/Throat: Oropharynx is clear and moist. No oropharyngeal exudate.  Eyes:  Conjunctivae and EOM are normal. Pupils are equal, round, and reactive to light. Right eye exhibits no discharge. Left eye exhibits no discharge.  Neck: Neck supple.  Cardiovascular: Normal rate and regular rhythm.  Murmur heard. Pulmonary/Chest: Effort normal and breath sounds normal. No respiratory distress. She has no wheezes. She has no rales.  Abdominal: Soft. Bowel sounds are normal. She exhibits no distension. There is no tenderness. There is no guarding.  Musculoskeletal: She exhibits edema and deformity.  Unsteady gait, wheelchair for ambulation, arthritis changes to fingers and hands, limited ROM with shoulders  Lymphadenopathy:    She has no cervical adenopathy.  Neurological: She is alert and oriented to person, place, and time.  Skin: Skin is warm and dry. No rash noted.  Psychiatric: She has a normal mood and affect.    Labs reviewed: Recent Labs    01/17/16 1440  08/27/16 09/03/16 09/17/16 11/21/16  NA 141   < > 142 141 142  --   K 4.1   < > 3.6 3.8 3.7 3.5  CL 104  --   --   --   --   --   CO2 28  --   --   --   --   --   GLUCOSE 119*  --   --   --   --   --   BUN 36*   < > 45* 52* 38*  --   CREATININE 1.47*   < > 1.7* 1.7* 1.6*  --   CALCIUM 9.1  --   --   --   --   --    < > = values in this interval not displayed.   Recent Labs    05/28/16 07/23/16 09/03/16  AST 15 15 14   ALT 8 10 11   ALKPHOS 116 92 121   Recent Labs    01/17/16 1440  05/28/16 07/23/16 08/30/16  WBC 9.9   < > 7.5 6.0 8.7  NEUTROABS 8.1*  --   --   --   --   HGB 11.9*   < > 11.5* 11.5* 12.3  HCT 36.8   < > 35* 34* 37  MCV 87.0  --   --   --   --   PLT 199   < > 172 146* 225   < > = values in this interval not displayed.   Lab Results  Component Value Date   TSH 4.23 10/22/2016   No results found for: HGBA1C  No results found for: CHOL, HDL, LDLCALC, LDLDIRECT, TRIG, CHOLHDL  Significant Diagnostic Results in last 30 days:  No results  found.  Assessment/Plan  Hypothyroidism Lab Results  Component Value Date   TSH 4.23 10/22/2016   Continue levothyroxine 75 mcg daily and monitor  Depression and anxiety Chronic, somewhat flat affect. Her chronic pain also affects her mood. Continue sertraline 50 mg daily with alprazolam, 0.25 mg qid.   Chronic pain With PMR and severe DJD/ OA. Continue chronic prednisone 6 mg daily and norco 10-325 mg q4h.   Essential HTN Stable BP reading on review. All readings <140/90. Currently on furosemide 40 mg bid, hydralazine 50 mg tid and amlodipine 5 mg daily with kcl. Discontinue amlodipine and monitor. Check bp bid x 1 week, then daily for 1 week and then weekly.   gerd With hiatal hernia. Symptoms have subsided some. Continue omeprazole 20 mg daily.   Dysphagia With liquids mainly, aspiration precautions. Being followed by SLP team.    Family/ staff Communication: reviewed care plan with patient and charge nurse.    Labs/tests ordered:  none   Blanchie Serve, MD Internal Medicine [facility] [address] [place], [place] [address] Cell Phone (Monday-Friday 8 am - 5 pm): [phone] On Call: [phone] and follow prompts after 5 pm and on weekends Office Phone: [phone] Office Fax: [phone]

## 2016-12-30 ENCOUNTER — Encounter: Payer: Self-pay | Admitting: Nurse Practitioner

## 2016-12-30 ENCOUNTER — Non-Acute Institutional Stay (SKILLED_NURSING_FACILITY): Payer: Medicare Other | Admitting: Nurse Practitioner

## 2016-12-30 DIAGNOSIS — R0682 Tachypnea, not elsewhere classified: Secondary | ICD-10-CM | POA: Diagnosis not present

## 2016-12-30 DIAGNOSIS — R531 Weakness: Secondary | ICD-10-CM

## 2016-12-30 DIAGNOSIS — R05 Cough: Secondary | ICD-10-CM | POA: Diagnosis not present

## 2016-12-30 DIAGNOSIS — R609 Edema, unspecified: Secondary | ICD-10-CM | POA: Diagnosis not present

## 2016-12-30 DIAGNOSIS — N3942 Incontinence without sensory awareness: Secondary | ICD-10-CM

## 2016-12-30 DIAGNOSIS — I10 Essential (primary) hypertension: Secondary | ICD-10-CM | POA: Diagnosis not present

## 2016-12-30 DIAGNOSIS — E860 Dehydration: Secondary | ICD-10-CM | POA: Diagnosis not present

## 2016-12-30 LAB — BASIC METABOLIC PANEL
BUN: 38 — AB (ref 4–21)
CREATININE: 1.6 — AB (ref <=1.1)
Glucose: 118
POTASSIUM: 3.8 (ref 3.4–5.3)
SODIUM: 140 (ref 137–147)

## 2016-12-30 LAB — CBC AND DIFFERENTIAL
HCT: 38 (ref 36–46)
Hemoglobin: 12.7 (ref 12.0–16.0)
Platelets: 171 (ref 150–399)
WBC: 7.5

## 2016-12-30 LAB — HEPATIC FUNCTION PANEL
ALT: 11 (ref 7–35)
AST: 19 (ref 13–35)
Alkaline Phosphatase: 100 (ref 25–125)
BILIRUBIN, TOTAL: 0.6

## 2016-12-30 NOTE — Progress Notes (Signed)
Location:  [facility] Room Number: 80 Place of Service:  SNF (31) Provider:  Mitra Duling, [place]  NP  Blanchie Serve, MD  Patient Care Team: Blanchie Serve, MD as PCP - General (Internal Medicine) Laurence Spates, MD as Consulting Physician (Gastroenterology) Hennie Duos, MD as Consulting Physician (Rheumatology) Corliss Parish, MD as Consulting Physician (Nephrology) Jettie Booze, MD as Consulting Physician (Cardiology) Glenna Fellows, MD as Attending Physician (Neurosurgery) Susa Day, MD as Consulting Physician (Orthopedic Surgery) Nyonna Hargrove X, NP as Nurse Practitioner (Internal Medicine)  Extended Emergency Contact Information Primary Emergency Contact: [facility] Address: [address]          [place], [place] [address] [name] of [place] Phone: [phone] Mobile Phone: [phone] Relation: Daughter Secondary Emergency Contact: Donnald Garre Address: [place], [place] of [place] Phone: [phone] Mobile Phone: [phone] Relation: Son  Code Status:  DNR Goals of care: Advanced Directive information Advanced Directives 12/30/2016  Does Patient Have a Medical Advance Directive? Yes  Type of Advance Directive Out of facility DNR (pink MOST or yellow form)  Does patient want to make changes to medical advance directive? No - Patient declined  Copy of [place] in Chart? -  Would patient like information on creating a medical advance directive? -  Pre-existing out of facility DNR order (yellow form or pink MOST form) Yellow form placed in chart (order not valid for inpatient use)     Chief Complaint  Patient presents with  . Acute Visit    weakness    HPI:  Pt is a 83 y.o. female seen today for an acute visit for C/o too weak to get out of bed, no focal weakness exhibited, her appetite is reportedly poor today, no difficulty swallow crushed pills in  applesauce. She is afebrile, but she was noticed increased respiration up to 30x/minutes when she was assisted with bedpan use. She was observed to have non productive cough, noted central congestion on examination.    Past Medical History:  Diagnosis Date  . Anxiety   . Aortic stenosis    a. Mild AS by echo 03/2013.  Marland Kitchen Arthritis   . Bilateral knee pain 06/10/2012  . Cardiomegaly 12/23/2014  . Cataract   . Chronic kidney disease    Stage III/IV  . Colitis, collagenous 07/11/2011  . Corticosteroid dependence ([place]) 12/17/2014  . DDD (degenerative disc disease), thoracolumbar 12/23/2014  . Debility 12/23/2014  . Diverticulitis   . Diverticulosis of colon without hemorrhage 12/23/2014  . Dysphagia 12/23/2014  . Edema 12/23/2014  . Effusion of left knee 12/23/2014  . Enteritis 12/17/2014  . Hiatal hernia 12/23/2014  . HNP (herniated nucleus pulposus), lumbar 12/23/2014   L 4-5   . Hyperlipidemia   . Hypertension   . Hypokalemia   . Hypothyroidism   . IBS (irritable bowel syndrome) 12/23/2014  . Irritable bowel   . Noninfected skin tear of leg 12/23/2014   Left leg   . Osteoarthritis of left knee 12/23/2014  . Osteoporosis   . PMR (polymyalgia rheumatica) (HCC)    a. On chronic prednisone.  . Polymyalgia rheumatica ([place]) 07/11/2011  . Premature atrial contractions    a. Noted during adm 03/2013, corresponding with patient's palpitations.  . Scoliosis (and kyphoscoliosis), idiopathic 12/23/2014  . Spinal stenosis   . Spinal stenosis of lumbar region 12/23/2014  . Symptomatic bradycardia    a. Adm 03/2013 - taken off  clonidine and BB.  . T11 vertebral fracture ([place]) 12/23/2014  . Unstable gait 12/23/2014   Past Surgical History:  Procedure Laterality Date  . ABDOMINAL HYSTERECTOMY  1966  . APPENDECTOMY    . BACK SURGERY    . Breast biopsies    . CHOLECYSTECTOMY    . EYE SURGERY      Allergies  Allergen Reactions  . Codeine Nausea And Vomiting  . Fosamax [Alendronate Sodium]      Unknown.   Dion Saucier [Calcitonin]     Unknown.  . Nsaids     Due to renal function.   . Quinolones Other (See Comments)    platelets  . Sulfa Antibiotics Nausea And Vomiting    Outpatient Encounter Medications as of 12/30/2016  Medication Sig  . ALPRAZolam (XANAX) 0.25 MG tablet Take 0.25 mg by mouth 4 (four) times daily.  . Cholecalciferol (VITAMIN D) 2000 UNITS CAPS Take 2,000 Units by mouth daily.  Marland Kitchen docusate sodium (COLACE) 100 MG capsule Take 100 mg by mouth 2 (two) times daily as needed for mild constipation.  . furosemide (LASIX) 40 MG tablet Take 40 mg by mouth 2 (two) times daily.   . hydrALAZINE (APRESOLINE) 50 MG tablet Take 1 tablet (50 mg total) by mouth 3 (three) times daily. (Every 8 hours)  . HYDROcodone-acetaminophen (NORCO) 10-325 MG tablet Take 1 tablet by mouth every 4 (four) hours.  . hydrocortisone cream 1 % Apply 1 application topically. Apply to inflamed anal area twice a day until healed  . levothyroxine (SYNTHROID, LEVOTHROID) 75 MCG tablet Take 75 mcg by mouth daily before breakfast.  . magnesium hydroxide (MILK OF MAGNESIA) 400 MG/5ML suspension Take 30 mLs by mouth as needed for mild constipation.  . nitroGLYCERIN (NITROSTAT) 0.4 MG SL tablet Place 0.4 mg under the tongue every 5 (five) minutes as needed for chest pain.  Marland Kitchen omeprazole (PRILOSEC) 20 MG capsule Take 20 mg by mouth daily.  . potassium chloride SA (K-DUR,KLOR-CON) 20 MEQ tablet Take 40 mEq by mouth 2 (two) times daily.  . predniSONE (DELTASONE) 1 MG tablet Take 1 mg by mouth daily with breakfast.   . predniSONE (DELTASONE) 5 MG tablet Take 5 mg by mouth every morning.   . sennosides-docusate sodium (SENOKOT-S) 8.6-50 MG tablet Take 2 tablets by mouth 2 (two) times daily between meals.   . sertraline (ZOLOFT) 25 MG tablet Take 25 mg by mouth at bedtime.   . sertraline (ZOLOFT) 50 MG tablet Take 50 mg by mouth at bedtime. Take with 25 mg to equal a total of 75 mg.  . [DISCONTINUED] amLODipine  (NORVASC) 5 MG tablet Take 5 mg by mouth daily.   No facility-administered encounter medications on file as of 12/30/2016.     Review of Systems  Constitutional: Positive for activity change, appetite change and fatigue. Negative for chills, diaphoresis and fever.  HENT: Positive for hearing loss. Negative for congestion, trouble swallowing and voice change.   Eyes: Negative for visual disturbance.  Respiratory: Positive for cough and shortness of breath. Negative for choking, chest tightness and wheezing.   Cardiovascular: Positive for leg swelling. Negative for chest pain and palpitations.  Gastrointestinal: Negative for abdominal distention, abdominal pain, constipation, diarrhea, nausea and vomiting.  Endocrine: Negative for cold intolerance.  Genitourinary: Negative for difficulty urinating, dysuria, frequency and urgency.       Urinary leakage when she was waiting for bedpan.   Musculoskeletal: Positive for arthralgias and gait problem.       Seeing in  bed, she state she is too weak to get out of bed  Skin: Negative for color change, pallor, rash and wound.       A large bruise left hand  Neurological: Negative for tremors, facial asymmetry, speech difficulty, weakness and headaches.  Hematological: Bruises/bleeds easily.  Psychiatric/Behavioral: Negative for agitation, behavioral problems, confusion, hallucinations and sleep disturbance. The patient is not nervous/anxious.     Immunization History  Administered Date(s) Administered  . Influenza-Unspecified 11/01/2013, 11/06/2016  . Pneumococcal Conjugate-13 03/22/2004  . Pneumococcal Polysaccharide-23 03/22/2004  . Tdap 11/21/2011  . Zoster 06/22/2009   Pertinent  Health Maintenance Due  Topic Date Due  . INFLUENZA VACCINE  Completed  . DEXA SCAN  Completed  . PNA vac Low Risk Adult  Completed   Fall Risk  09/03/2016 01/17/2016  Falls in the past year? Yes Yes  Number falls in past yr: 1 2 or more  Injury with Fall? Yes -    Functional Status Survey:    Vitals:   12/30/16 1415  BP: 120/80  Pulse: 65  Resp: 20  Temp: 98.2 F (36.8 C)  SpO2: 91%  Weight: 136 lb (61.7 kg)  Height: 5\' 4"  (1.626 m)   Body mass index is 23.34 kg/m. Physical Exam  Constitutional: She is oriented to person, place, and time. She appears well-developed and well-nourished. No distress.  Cardiovascular: Normal rate, regular rhythm and normal heart sounds.  No murmur heard. Pulmonary/Chest: No respiratory distress. She has no wheezes. She has no rales. She exhibits no tenderness.  Tachypnea upon my examination, central congestion.   Musculoskeletal: Normal range of motion. She exhibits edema. She exhibits no tenderness.  Trace edema BLE, R>L  Neurological: She is alert and oriented to person, place, and time. She exhibits normal muscle tone. Coordination normal.  Skin: Skin is warm and dry. No rash noted. She is not diaphoretic. No erythema. No pallor.  A large bruise covered the entire dorsum left hand  Psychiatric: She has a normal mood and affect. Her behavior is normal. Judgment and thought content normal.    Labs reviewed: Recent Labs    01/17/16 1440  09/03/16 09/17/16 11/21/16 12/30/16  NA 141   < > 141 142  --  140  K 4.1   < > 3.8 3.7 3.5 3.8  CL 104  --   --   --   --   --   CO2 28  --   --   --   --   --   GLUCOSE 119*  --   --   --   --   --   BUN 36*   < > 52* 38*  --  38*  CREATININE 1.47*   < > 1.7* 1.6*  --  1.6*  CALCIUM 9.1  --   --   --   --   --    < > = values in this interval not displayed.   Recent Labs    07/23/16 09/03/16 12/30/16  AST 15 14 19   ALT 10 11 11   ALKPHOS 92 121 100   Recent Labs    01/17/16 1440  07/23/16 08/30/16 12/30/16  WBC 9.9   < > 6.0 8.7 7.5  NEUTROABS 8.1*  --   --   --   --   HGB 11.9*   < > 11.5* 12.3 12.7  HCT 36.8   < > 34* 37 38  MCV 87.0  --   --   --   --  PLT 199   < > 146* 225 171   < > = values in this interval not displayed.   Lab Results    Component Value Date   TSH 4.23 10/22/2016   No results found for: HGBA1C No results found for: CHOL, HDL, LDLCALC, LDLDIRECT, TRIG, CHOLHDL  Significant Diagnostic Results in last 30 days:  No results found.  Assessment/Plan Generalized weakness C/o too weak to get out of bed, no focal weakness exhibited, her appetite is reportedly poor today, no difficulty swallow crushed pills in applesauce. She is afebrile, will update CBC/diff and CMP, VS q shift for now  Tachypnea she was noticed increased respiration up to 30x/minutes when she was assisted with bedpan use. She was observed to have non productive cough, noted central congestion on examination. Obtain CXR to evaluate further.    Edema chronic BLE edema, trace edema in ankles, R>L, , continue Furosemide 40mg  bid    Incontinent of urine Urinary leakage while waiting for assistance with bedpan. Denied dysuria, abd or CVA tenderness, no observed blood in urine.      Family/ staff Communication: plan of care reviewed with the patient and charge nurse.  Labs/tests ordered:  CBC/diff, CMP, CXR  Time spend 25 minutes.

## 2016-12-30 NOTE — Assessment & Plan Note (Signed)
C/o too weak to get out of bed, no focal weakness exhibited, her appetite is reportedly poor today, no difficulty swallow crushed pills in applesauce. She is afebrile, will update CBC/diff and CMP, VS q shift for now

## 2016-12-30 NOTE — Assessment & Plan Note (Signed)
chronic BLE edema, trace edema in ankles, R>L, , continue Furosemide 40mg  bid

## 2016-12-30 NOTE — Assessment & Plan Note (Signed)
she was noticed increased respiration up to 30x/minutes when she was assisted with bedpan use. She was observed to have non productive cough, noted central congestion on examination. Obtain CXR to evaluate further.

## 2016-12-30 NOTE — Assessment & Plan Note (Signed)
Urinary leakage while waiting for assistance with bedpan. Denied dysuria, abd or CVA tenderness, no observed blood in urine.

## 2016-12-31 ENCOUNTER — Encounter: Payer: Self-pay | Admitting: Nurse Practitioner

## 2016-12-31 ENCOUNTER — Other Ambulatory Visit: Payer: Self-pay | Admitting: *Deleted

## 2016-12-31 DIAGNOSIS — J189 Pneumonia, unspecified organism: Secondary | ICD-10-CM | POA: Insufficient documentation

## 2016-12-31 DIAGNOSIS — J181 Lobar pneumonia, unspecified organism: Secondary | ICD-10-CM

## 2017-01-01 ENCOUNTER — Encounter: Payer: Self-pay | Admitting: Nurse Practitioner

## 2017-01-01 ENCOUNTER — Non-Acute Institutional Stay (SKILLED_NURSING_FACILITY): Payer: Medicare Other | Admitting: Nurse Practitioner

## 2017-01-01 DIAGNOSIS — K5900 Constipation, unspecified: Secondary | ICD-10-CM | POA: Diagnosis not present

## 2017-01-01 DIAGNOSIS — R609 Edema, unspecified: Secondary | ICD-10-CM

## 2017-01-01 DIAGNOSIS — F418 Other specified anxiety disorders: Secondary | ICD-10-CM | POA: Diagnosis not present

## 2017-01-01 DIAGNOSIS — K219 Gastro-esophageal reflux disease without esophagitis: Secondary | ICD-10-CM | POA: Diagnosis not present

## 2017-01-01 DIAGNOSIS — R627 Adult failure to thrive: Secondary | ICD-10-CM

## 2017-01-01 DIAGNOSIS — J181 Lobar pneumonia, unspecified organism: Secondary | ICD-10-CM | POA: Diagnosis not present

## 2017-01-01 DIAGNOSIS — J189 Pneumonia, unspecified organism: Secondary | ICD-10-CM

## 2017-01-01 DIAGNOSIS — M48061 Spinal stenosis, lumbar region without neurogenic claudication: Secondary | ICD-10-CM | POA: Diagnosis not present

## 2017-01-01 DIAGNOSIS — R1312 Dysphagia, oropharyngeal phase: Secondary | ICD-10-CM

## 2017-01-01 DIAGNOSIS — R531 Weakness: Secondary | ICD-10-CM | POA: Diagnosis not present

## 2017-01-01 NOTE — Progress Notes (Signed)
Location:  [facility] Room Number: 29 Place of Service:  SNF (31) Provider:  Cosette Prindle, Manxie  NP  Blanchie Serve, MD  Patient Care Team: Blanchie Serve, MD as PCP - General (Internal Medicine) Laurence Spates, MD as Consulting Physician (Gastroenterology) Hennie Duos, MD as Consulting Physician (Rheumatology) Corliss Parish, MD as Consulting Physician (Nephrology) Jettie Booze, MD as Consulting Physician (Cardiology) Glenna Fellows, MD as Attending Physician (Neurosurgery) Susa Day, MD as Consulting Physician (Orthopedic Surgery) Azar South X, NP as Nurse Practitioner (Internal Medicine)  Extended Emergency Contact Information Primary Emergency Contact: [facility] Address: [address]          [place], [place] [address] [name] [place] Phone: [phone] Mobile Phone: [phone] Relation: Daughter Secondary Emergency Contact: Donnald Garre Address: [place], [place] Phone: [phone] Mobile Phone: [phone] Relation: Son  Code Status:  DNR Goals of care: Advanced Directive information Advanced Directives 01/01/2017  Does Patient Have a Medical Advance Directive? Yes  Type of Advance Directive Out of facility DNR (pink MOST or yellow form)  Does patient want to make changes to medical advance directive? No - Patient declined  Copy of [place] in Chart? -  Would patient like information on creating a medical advance directive? -  Pre-existing out of facility DNR order (yellow form or pink MOST form) Yellow form placed in chart (order not valid for inpatient use)     Chief Complaint  Patient presents with  . Acute Visit    Pneumonia    HPI:  Pt is a 83 y.o. female seen today for an acute visit for pneumonia, persisted generalized weakness. The patient's CXR 12/30/16 showed small pleural effusions and lower lung infiltrates. 10 day course  Doxycycline 100mg  bid started subsequently. Her cough is improved slightly, but she remains weak, bedrest, difficulty of swallowing, and poor appetite. Spoke with the patient's daughter Lorelle Gibbs: desires comfort measures, simplify medications. Her mood is stable on Sertraline, no apparent edema, on Furosemide.    Past Medical History:  Diagnosis Date  . Anxiety   . Aortic stenosis    a. Mild AS by echo 03/2013.  Marland Kitchen Arthritis   . Bilateral knee pain 06/10/2012  . Cardiomegaly 12/23/2014  . Cataract   . Chronic kidney disease    Stage III/IV  . Colitis, collagenous 07/11/2011  . Corticosteroid dependence ([place]) 12/17/2014  . DDD (degenerative disc disease), thoracolumbar 12/23/2014  . Debility 12/23/2014  . Diverticulitis   . Diverticulosis of colon without hemorrhage 12/23/2014  . Dysphagia 12/23/2014  . Edema 12/23/2014  . Effusion of left knee 12/23/2014  . Enteritis 12/17/2014  . Hiatal hernia 12/23/2014  . HNP (herniated nucleus pulposus), lumbar 12/23/2014   L 4-5   . Hyperlipidemia   . Hypertension   . Hypokalemia   . Hypothyroidism   . IBS (irritable bowel syndrome) 12/23/2014  . Irritable bowel   . Noninfected skin tear of leg 12/23/2014   Left leg   . Osteoarthritis of left knee 12/23/2014  . Osteoporosis   . PMR (polymyalgia rheumatica) (HCC)    a. On chronic prednisone.  . Polymyalgia rheumatica ([place]) 07/11/2011  . Premature atrial contractions    a. Noted during adm 03/2013, corresponding with patient's palpitations.  . Scoliosis (and kyphoscoliosis), idiopathic 12/23/2014  . Spinal stenosis   . Spinal stenosis of lumbar region 12/23/2014  . Symptomatic bradycardia  a. Adm 03/2013 - taken off clonidine and BB.  . T11 vertebral fracture ([place]) 12/23/2014  . Unstable gait 12/23/2014   Past Surgical History:  Procedure Laterality Date  . ABDOMINAL HYSTERECTOMY  1966  . APPENDECTOMY    . BACK SURGERY    . Breast biopsies    . CHOLECYSTECTOMY    . EYE SURGERY       Allergies  Allergen Reactions  . Codeine Nausea And Vomiting  . Fosamax [Alendronate Sodium]     Unknown.   Dion Saucier [Calcitonin]     Unknown.  . Nsaids     Due to renal function.   . Quinolones Other (See Comments)    platelets  . Sulfa Antibiotics Nausea And Vomiting    Outpatient Encounter Medications as of 01/01/2017  Medication Sig  . ALPRAZolam (XANAX) 0.25 MG tablet Take 0.25 mg by mouth 4 (four) times daily.  . Cholecalciferol (VITAMIN D) 2000 UNITS CAPS Take 2,000 Units by mouth daily.  Marland Kitchen docusate sodium (COLACE) 100 MG capsule Take 100 mg by mouth 2 (two) times daily as needed for mild constipation.  Marland Kitchen doxycycline (DORYX) 100 MG EC tablet Take 100 mg by mouth 2 (two) times daily.  . furosemide (LASIX) 40 MG tablet Take 40 mg by mouth 2 (two) times daily.   . hydrALAZINE (APRESOLINE) 50 MG tablet Take 1 tablet (50 mg total) by mouth 3 (three) times daily. (Every 8 hours)  . HYDROcodone-acetaminophen (NORCO) 10-325 MG tablet Take 1 tablet by mouth every 4 (four) hours.  . hydrocortisone cream 1 % Apply 1 application topically. Apply to inflamed anal area twice a day until healed  . levothyroxine (SYNTHROID, LEVOTHROID) 75 MCG tablet Take 75 mcg by mouth daily before breakfast.  . magnesium hydroxide (MILK OF MAGNESIA) 400 MG/5ML suspension Take 30 mLs by mouth as needed for mild constipation.  . nitroGLYCERIN (NITROSTAT) 0.4 MG SL tablet Place 0.4 mg under the tongue every 5 (five) minutes as needed for chest pain.  Marland Kitchen omeprazole (PRILOSEC) 20 MG capsule Take 20 mg by mouth daily.  . potassium chloride SA (K-DUR,KLOR-CON) 20 MEQ tablet Take 40 mEq by mouth 2 (two) times daily.  . predniSONE (DELTASONE) 1 MG tablet Take 1 mg by mouth daily with breakfast.   . predniSONE (DELTASONE) 5 MG tablet Take 5 mg by mouth every morning.   . saccharomyces boulardii (FLORASTOR) 250 MG capsule Take 250 mg by mouth 2 (two) times daily.  . sennosides-docusate sodium (SENOKOT-S) 8.6-50  MG tablet Take 2 tablets by mouth 2 (two) times daily between meals.   . sertraline (ZOLOFT) 25 MG tablet Take 25 mg by mouth at bedtime.   . sertraline (ZOLOFT) 50 MG tablet Take 50 mg by mouth at bedtime. Take with 25 mg to equal a total of 75 mg.   No facility-administered encounter medications on file as of 01/01/2017.    ROS was provided with assistance of the patient's daughter and staff Review of Systems  Constitutional: Positive for activity change, appetite change and fatigue. Negative for chills, diaphoresis and fever.  HENT: Positive for hearing loss and trouble swallowing. Negative for congestion and voice change.   Eyes: Negative for visual disturbance.  Respiratory: Positive for cough. Negative for choking, chest tightness, shortness of breath and wheezing.   Cardiovascular: Negative for chest pain, palpitations and leg swelling.  Gastrointestinal: Negative for abdominal distention, abdominal pain, constipation, diarrhea, nausea and vomiting.  Endocrine: Negative for cold intolerance.  Genitourinary: Negative for difficulty urinating, dysuria,  frequency and urgency.  Musculoskeletal: Positive for back pain and gait problem.  Skin: Negative for color change, pallor, rash and wound.  Neurological: Negative for tremors, speech difficulty, weakness and headaches.  Hematological: Bruises/bleeds easily.  Psychiatric/Behavioral: Positive for confusion. Negative for agitation, behavioral problems, hallucinations and sleep disturbance. The patient is not nervous/anxious.     Immunization History  Administered Date(s) Administered  . Influenza-Unspecified 11/01/2013, 11/06/2016  . Pneumococcal Conjugate-13 03/22/2004  . Pneumococcal Polysaccharide-23 03/22/2004  . Tdap 11/21/2011  . Zoster 06/22/2009   Pertinent  Health Maintenance Due  Topic Date Due  . INFLUENZA VACCINE  Completed  . DEXA SCAN  Completed  . PNA vac Low Risk Adult  Completed   Fall Risk  09/03/2016 01/17/2016   Falls in the past year? Yes Yes  Number falls in past yr: 1 2 or more  Injury with Fall? Yes -   Functional Status Survey:    Vitals:   01/01/17 1521  BP: (!) 142/70  Pulse: 74  Resp: 18  Temp: 98 F (36.7 C)  SpO2: 93%  Weight: 136 lb (61.7 kg)  Height: 5\' 4"  (1.626 m)   Body mass index is 23.34 kg/m. Physical Exam  Constitutional: She appears well-developed and well-nourished.  HENT:  Head: Normocephalic and atraumatic.  Eyes: Conjunctivae and EOM are normal. Pupils are equal, round, and reactive to light.  Neck: Normal range of motion. Neck supple. No JVD present. No thyromegaly present.  Cardiovascular: Normal rate and regular rhythm.  No murmur heard. Pulmonary/Chest: Effort normal and breath sounds normal. She has no wheezes. She has no rales.  Abdominal: Soft. Bowel sounds are normal. She exhibits no distension.  Musculoskeletal: Normal range of motion. She exhibits no edema or tenderness.  Neurological: She is alert. Coordination normal.  Oriented to person and place  Skin: Skin is warm and dry. No rash noted. No erythema.  Left hand bruise    Labs reviewed: Recent Labs    01/17/16 1440  09/17/16 11/21/16 12/30/16 01/06/17  NA 141   < > 142  --  140 138  K 4.1   < > 3.7 3.5 3.8 4.1  CL 104  --   --   --   --   --   CO2 28  --   --   --   --   --   GLUCOSE 119*  --   --   --   --   --   BUN 36*   < > 38*  --  38* 65*  CREATININE 1.47*   < > 1.6*  --  1.6* 1.8*  CALCIUM 9.1  --   --   --   --   --    < > = values in this interval not displayed.   Recent Labs    07/23/16 09/03/16 12/30/16  AST 15 14 19   ALT 10 11 11   ALKPHOS 92 121 100   Recent Labs    01/17/16 1440  07/23/16 08/30/16 12/30/16  WBC 9.9   < > 6.0 8.7 7.5  NEUTROABS 8.1*  --   --   --   --   HGB 11.9*   < > 11.5* 12.3 12.7  HCT 36.8   < > 34* 37 38  MCV 87.0  --   --   --   --   PLT 199   < > 146* 225 171   < > = values in this interval not displayed.   Lab Results  Component  Value Date   TSH 4.23 10/22/2016   No results found for: HGBA1C No results found for: CHOL, HDL, LDLCALC, LDLDIRECT, TRIG, CHOLHDL  Significant Diagnostic Results in last 30 days:  No results found.  Assessment/Plan Left lower lobe pneumonia ([place]) 12/30/16 wbc 7.5, Hgb 12.7, plt 171, neutrophil 72.2, Na 140, K 3.8, Bun 38. Creat 1.59, TP 5.8, alubmin 3.5. CXR small pleural effusions and lower lung infiltrates Doxycycline 100mg  bid x 10 days, DuoNeb qid x 3 days.   Dysphagia Diet modification, continue honey thick fluid, aspiration risk.   Adult failure to thrive Comfort measures, desires no further workups, Hospice referral.   Depression with anxiety Her mood is stable, dc Sertraline, continue Alprazolam qid  Edema No apparent edema, dc Furosemide/Kcl.   Spinal stenosis of lumbar region Back pain is better since she became bedrest, dc Norco, Vit D. Will have Morphine prn available for her  Constipation Loose stools, dc Senna Plus.  GERD (gastroesophageal reflux disease) Asymptomatic, dc Omeprazole.      Family/ staff Communication: plan of care reviewed with the patient, the patient's daughter Lorelle Gibbs, and charge nurse.   Labs/tests ordered:  None  Time spend 25 minutes.

## 2017-01-02 DIAGNOSIS — R443 Hallucinations, unspecified: Secondary | ICD-10-CM | POA: Diagnosis not present

## 2017-01-02 DIAGNOSIS — K219 Gastro-esophageal reflux disease without esophagitis: Secondary | ICD-10-CM | POA: Diagnosis not present

## 2017-01-02 DIAGNOSIS — I495 Sick sinus syndrome: Secondary | ICD-10-CM | POA: Diagnosis not present

## 2017-01-02 DIAGNOSIS — F411 Generalized anxiety disorder: Secondary | ICD-10-CM | POA: Diagnosis not present

## 2017-01-02 DIAGNOSIS — I359 Nonrheumatic aortic valve disorder, unspecified: Secondary | ICD-10-CM | POA: Diagnosis not present

## 2017-01-02 DIAGNOSIS — I25810 Atherosclerosis of coronary artery bypass graft(s) without angina pectoris: Secondary | ICD-10-CM | POA: Diagnosis not present

## 2017-01-02 DIAGNOSIS — R54 Age-related physical debility: Secondary | ICD-10-CM | POA: Diagnosis not present

## 2017-01-02 DIAGNOSIS — R627 Adult failure to thrive: Secondary | ICD-10-CM | POA: Insufficient documentation

## 2017-01-02 DIAGNOSIS — R1310 Dysphagia, unspecified: Secondary | ICD-10-CM | POA: Diagnosis not present

## 2017-01-02 DIAGNOSIS — J690 Pneumonitis due to inhalation of food and vomit: Secondary | ICD-10-CM | POA: Diagnosis not present

## 2017-01-02 DIAGNOSIS — R0600 Dyspnea, unspecified: Secondary | ICD-10-CM | POA: Diagnosis not present

## 2017-01-02 DIAGNOSIS — I4891 Unspecified atrial fibrillation: Secondary | ICD-10-CM | POA: Diagnosis not present

## 2017-01-02 DIAGNOSIS — I519 Heart disease, unspecified: Secondary | ICD-10-CM | POA: Diagnosis not present

## 2017-01-02 DIAGNOSIS — I10 Essential (primary) hypertension: Secondary | ICD-10-CM | POA: Diagnosis not present

## 2017-01-02 DIAGNOSIS — F339 Major depressive disorder, recurrent, unspecified: Secondary | ICD-10-CM | POA: Diagnosis not present

## 2017-01-02 DIAGNOSIS — N189 Chronic kidney disease, unspecified: Secondary | ICD-10-CM | POA: Diagnosis not present

## 2017-01-02 DIAGNOSIS — E785 Hyperlipidemia, unspecified: Secondary | ICD-10-CM | POA: Diagnosis not present

## 2017-01-02 NOTE — Assessment & Plan Note (Signed)
Loose stools, dc Senna Plus.

## 2017-01-02 NOTE — Assessment & Plan Note (Signed)
12/30/16 wbc 7.5, Hgb 12.7, plt 171, neutrophil 72.2, Na 140, K 3.8, Bun 38. Creat 1.59, TP 5.8, alubmin 3.5. CXR small pleural effusions and lower lung infiltrates Doxycycline 100mg  bid x 10 days, DuoNeb qid x 3 days.

## 2017-01-02 NOTE — Assessment & Plan Note (Signed)
No apparent edema, dc Furosemide/Kcl.

## 2017-01-02 NOTE — Assessment & Plan Note (Addendum)
Diet modification, continue honey thick fluid, aspiration risk.

## 2017-01-02 NOTE — Assessment & Plan Note (Addendum)
Back pain is better since she became bedrest, dc Norco, Vit D. Will have Morphine prn available for her

## 2017-01-02 NOTE — Assessment & Plan Note (Signed)
Her mood is stable, dc Sertraline, continue Alprazolam qid

## 2017-01-02 NOTE — Assessment & Plan Note (Signed)
Asymptomatic, dc Omeprazole.

## 2017-01-02 NOTE — Assessment & Plan Note (Addendum)
Comfort measures, desires no further workups, Hospice referral.

## 2017-01-03 DIAGNOSIS — I359 Nonrheumatic aortic valve disorder, unspecified: Secondary | ICD-10-CM | POA: Diagnosis not present

## 2017-01-03 DIAGNOSIS — N189 Chronic kidney disease, unspecified: Secondary | ICD-10-CM | POA: Diagnosis not present

## 2017-01-03 DIAGNOSIS — I25810 Atherosclerosis of coronary artery bypass graft(s) without angina pectoris: Secondary | ICD-10-CM | POA: Diagnosis not present

## 2017-01-03 DIAGNOSIS — I4891 Unspecified atrial fibrillation: Secondary | ICD-10-CM | POA: Diagnosis not present

## 2017-01-03 DIAGNOSIS — I519 Heart disease, unspecified: Secondary | ICD-10-CM | POA: Diagnosis not present

## 2017-01-03 DIAGNOSIS — I495 Sick sinus syndrome: Secondary | ICD-10-CM | POA: Diagnosis not present

## 2017-01-06 DIAGNOSIS — N189 Chronic kidney disease, unspecified: Secondary | ICD-10-CM | POA: Diagnosis not present

## 2017-01-06 DIAGNOSIS — I359 Nonrheumatic aortic valve disorder, unspecified: Secondary | ICD-10-CM | POA: Diagnosis not present

## 2017-01-06 DIAGNOSIS — I4891 Unspecified atrial fibrillation: Secondary | ICD-10-CM | POA: Diagnosis not present

## 2017-01-06 DIAGNOSIS — I129 Hypertensive chronic kidney disease with stage 1 through stage 4 chronic kidney disease, or unspecified chronic kidney disease: Secondary | ICD-10-CM | POA: Diagnosis not present

## 2017-01-06 DIAGNOSIS — I10 Essential (primary) hypertension: Secondary | ICD-10-CM | POA: Diagnosis not present

## 2017-01-06 DIAGNOSIS — I495 Sick sinus syndrome: Secondary | ICD-10-CM | POA: Diagnosis not present

## 2017-01-06 DIAGNOSIS — I25810 Atherosclerosis of coronary artery bypass graft(s) without angina pectoris: Secondary | ICD-10-CM | POA: Diagnosis not present

## 2017-01-06 DIAGNOSIS — I519 Heart disease, unspecified: Secondary | ICD-10-CM | POA: Diagnosis not present

## 2017-01-06 LAB — BASIC METABOLIC PANEL
BUN: 65 — AB (ref 4–21)
CREATININE: 1.8 — AB (ref <=1.1)
Glucose: 81
POTASSIUM: 4.1 (ref 3.4–5.3)
SODIUM: 138 (ref 137–147)

## 2017-01-08 ENCOUNTER — Other Ambulatory Visit: Payer: Self-pay | Admitting: *Deleted

## 2017-01-08 DIAGNOSIS — I25810 Atherosclerosis of coronary artery bypass graft(s) without angina pectoris: Secondary | ICD-10-CM | POA: Diagnosis not present

## 2017-01-08 DIAGNOSIS — I519 Heart disease, unspecified: Secondary | ICD-10-CM | POA: Diagnosis not present

## 2017-01-08 DIAGNOSIS — N189 Chronic kidney disease, unspecified: Secondary | ICD-10-CM | POA: Diagnosis not present

## 2017-01-08 DIAGNOSIS — I4891 Unspecified atrial fibrillation: Secondary | ICD-10-CM | POA: Diagnosis not present

## 2017-01-08 DIAGNOSIS — I495 Sick sinus syndrome: Secondary | ICD-10-CM | POA: Diagnosis not present

## 2017-01-08 DIAGNOSIS — I359 Nonrheumatic aortic valve disorder, unspecified: Secondary | ICD-10-CM | POA: Diagnosis not present

## 2017-01-09 DIAGNOSIS — I519 Heart disease, unspecified: Secondary | ICD-10-CM | POA: Diagnosis not present

## 2017-01-09 DIAGNOSIS — I4891 Unspecified atrial fibrillation: Secondary | ICD-10-CM | POA: Diagnosis not present

## 2017-01-09 DIAGNOSIS — I25810 Atherosclerosis of coronary artery bypass graft(s) without angina pectoris: Secondary | ICD-10-CM | POA: Diagnosis not present

## 2017-01-09 DIAGNOSIS — N189 Chronic kidney disease, unspecified: Secondary | ICD-10-CM | POA: Diagnosis not present

## 2017-01-09 DIAGNOSIS — I495 Sick sinus syndrome: Secondary | ICD-10-CM | POA: Diagnosis not present

## 2017-01-09 DIAGNOSIS — I359 Nonrheumatic aortic valve disorder, unspecified: Secondary | ICD-10-CM | POA: Diagnosis not present

## 2017-01-14 DEATH — deceased

## 2018-08-07 IMAGING — CT CT CERVICAL SPINE W/O CM
4 of 7 series · 13 of 33 positions shown, 14 images · non-contrast
Comparison: CT from 02/12/2015

CLINICAL DATA: Patient fell at 9 a.m. after being tingling bed
sheets. Hit head against dresser without loss of consciousness.

EXAM:
CT HEAD WITHOUT CONTRAST
CT CERVICAL SPINE WITHOUT CONTRAST
TECHNIQUE: Multidetector CT imaging of the head and cervical spine was
performed following the standard protocol without intravenous
contrast. Multiplanar CT image reconstructions of the cervical spine
were also generated.

[Series 3: bone windows · axial · 0.45mm/px · z∈[+1522,+1570]mm · 2 of 48 slices shown, 3 images]
[im 16/48  soft-tissue]
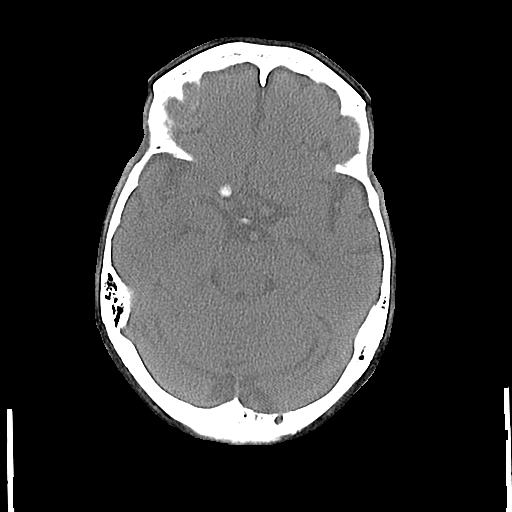
[im 16/48  bone]
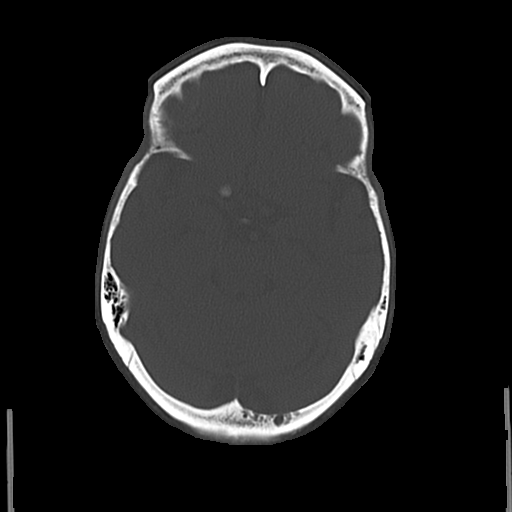
[im 32/48  bone]
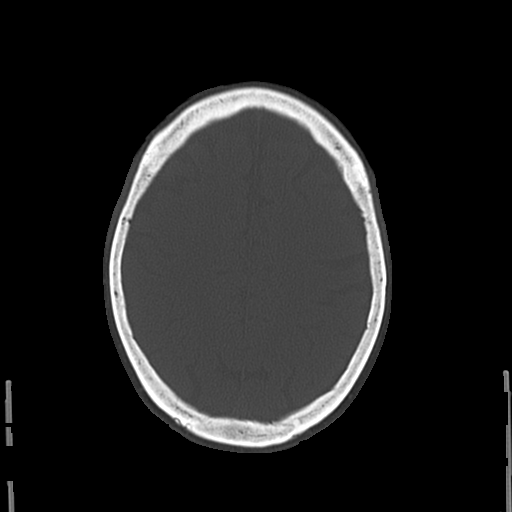

[Series 5: coronal · coronal · 0.33mm/px · 3 of 67 slices shown]
[im 14/67  bone]
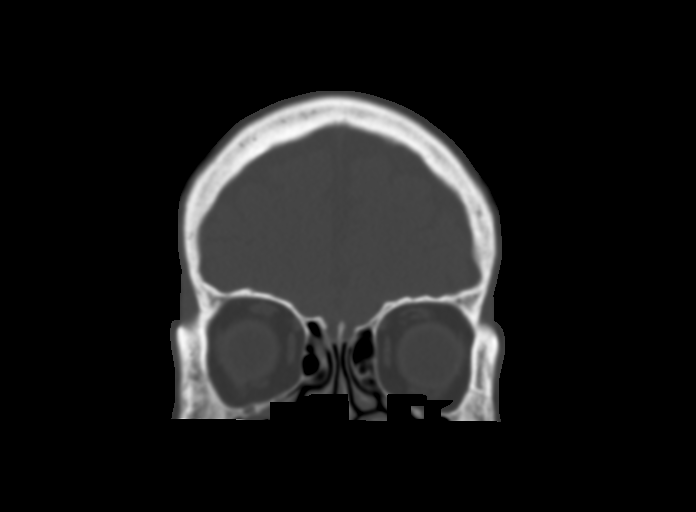
[im 27/67  bone]
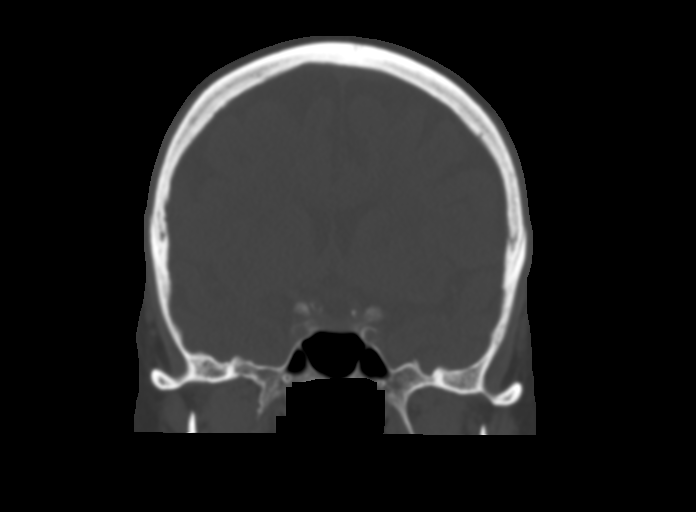
[im 40/67  bone]
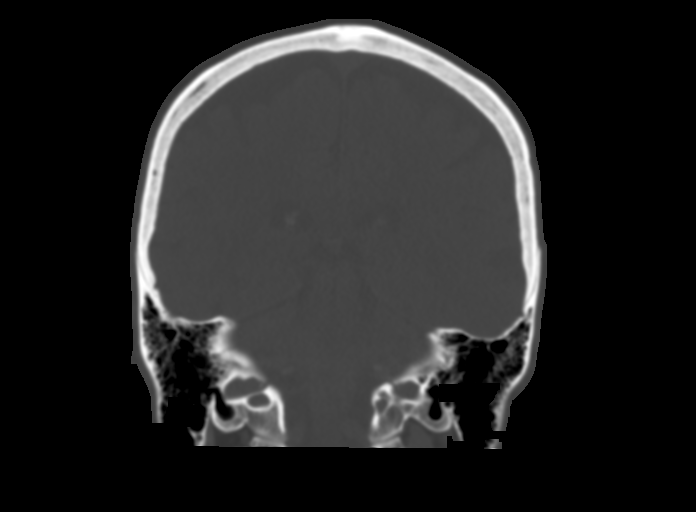

[Series 7: c-spine st · axial · 0.35mm/px · z∈[+1380,+1448]mm · 3 of 68 slices shown]
[im 17/68  bone]
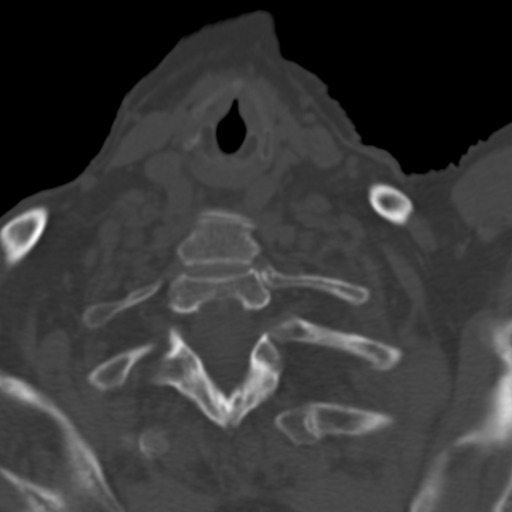
[im 34/68  bone]
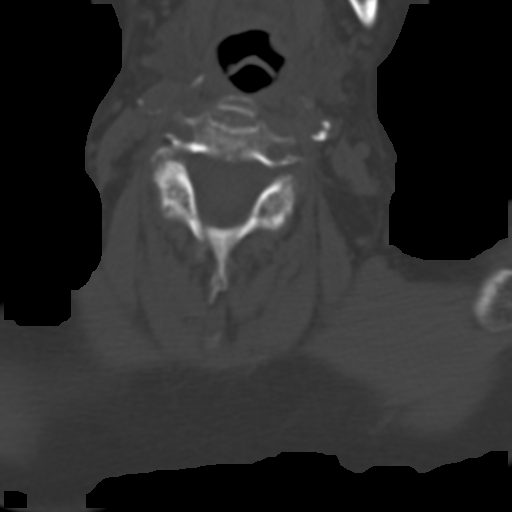
[im 51/68  bone]
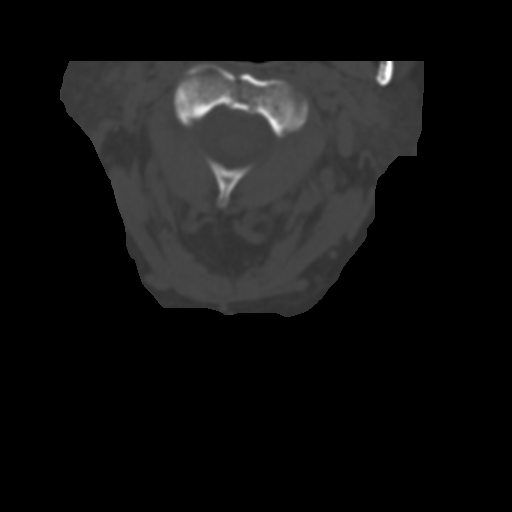

[Series 12: sagittal · sagittal · 0.34mm/px · 5 of 50 slices shown]
[im 9/50  bone]
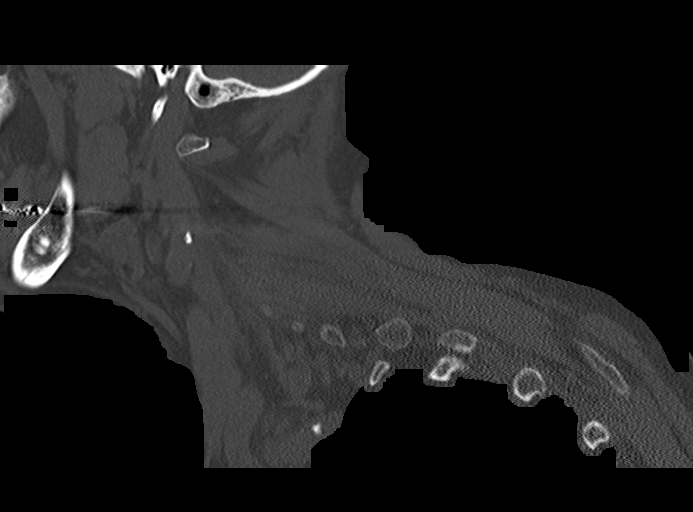
[im 17/50  bone]
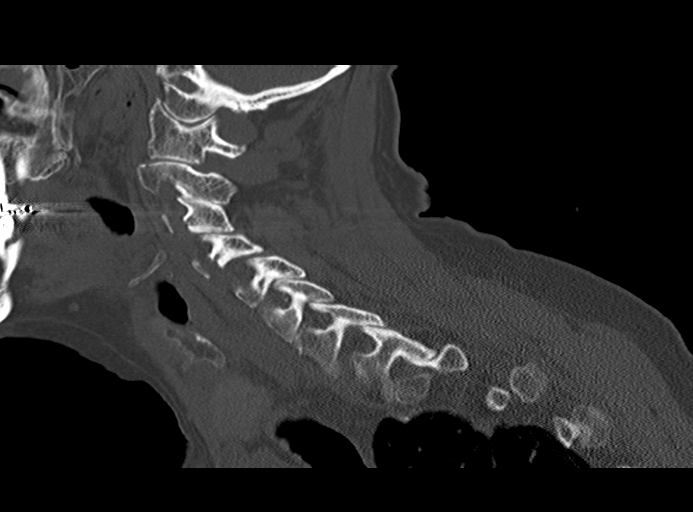
[im 25/50  bone]
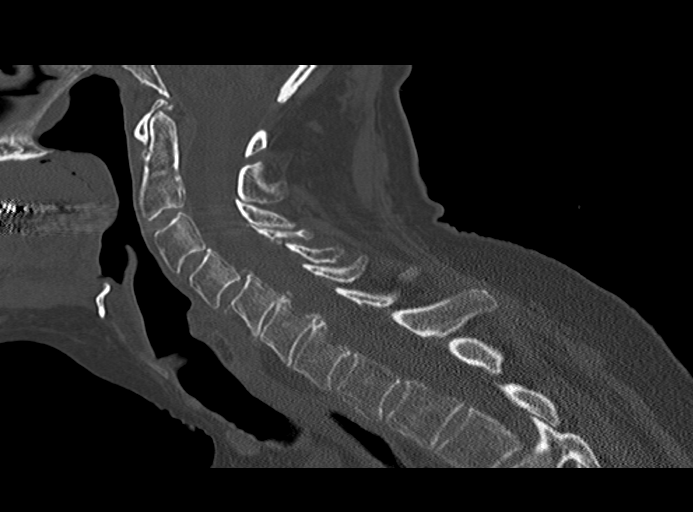
[im 33/50  bone]
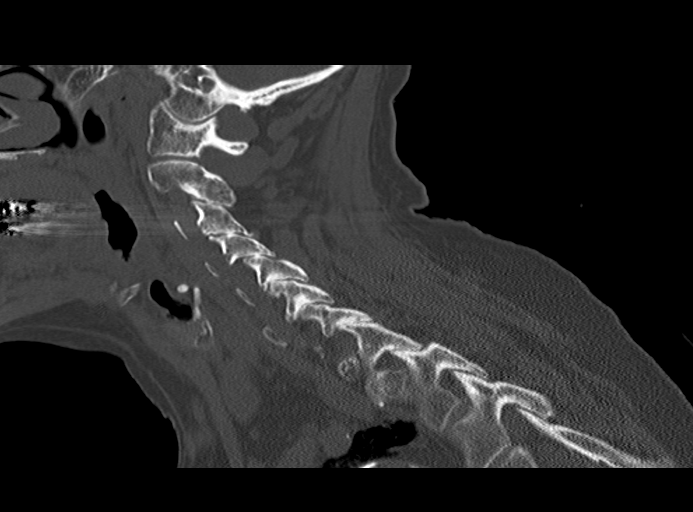
[im 41/50  bone]
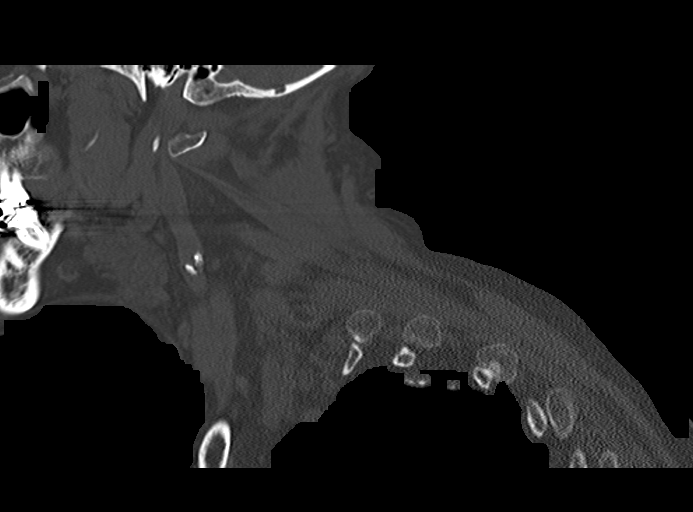

[13 of 33 positions shown; findings below may reference images not displayed]

FINDINGS: CT HEAD FINDINGS

BRAIN: No acute intracranial hemorrhage, mass nor large vascular
territory infarctions. There are mild stable patchy periventricular
and subcortical white matter hypodensities consistent with chronic
small vessel ischemic disease. No effacement of the basal cisterns.
Mild chronic ischemic disease of the basal ganglia bilaterally as
before. Brainstem and fourth ventricle are within normal limits.
Mild superficial atrophy is seen. Head heard a

VASCULAR: Moderate calcific atherosclerosis of the carotid siphons.

SKULL: No skull fracture. No significant scalp soft tissue swelling.

SINUSES/ORBITS: The mastoid air-cells are clear. The included
paranasal sinuses are well-aerated.The included ocular globes and
orbital contents are non-suspicious.

OTHER: None.

CT CERVICAL SPINE FINDINGS

Alignment: Normal cervical lordosis with slight chronic
anterolisthesis of C4 on C5.

Skull base and vertebrae: No acute fracture bone destruction.
Osteoarthritis of the atlantodental interval with joint space
narrowing and sclerosis.

Soft tissues and spinal canal: Nonacute. No prevertebral soft tissue
swelling. No intraspinal hemorrhage.

Disc levels: Cervical spondylosis with disc space narrowing most
pronounced from C5 through C7 with small posterior marginal
osteophytes.

Upper chest: Minimal subpleural fibrosis and/or atelectasis at the
apices.

Other: Minimal calcification of the carotid bifurcations
bilaterally.
IMPRESSION: No acute intracranial nor cervical spinal abnormality.

Chronic stable mild volume loss with scattered small vessel ischemic
microangiopathy. Chronic mild ischemic change in the bilateral basal
ganglia.

Mild cervical spondylosis. Chronic minimal anterolisthesis of C4 on
C5.
